# Patient Record
Sex: Male | Born: 1970 | Race: White | Hispanic: No | Marital: Single | Smoking: Current every day smoker
Health system: Southern US, Community
[De-identification: ages and names within clinical notes are randomized; demographics above are authoritative.]

## PROBLEM LIST (undated history)

## (undated) DIAGNOSIS — K219 Gastro-esophageal reflux disease without esophagitis: Secondary | ICD-10-CM

## (undated) DIAGNOSIS — K4090 Unilateral inguinal hernia, without obstruction or gangrene, not specified as recurrent: Secondary | ICD-10-CM

## (undated) DIAGNOSIS — F17200 Nicotine dependence, unspecified, uncomplicated: Secondary | ICD-10-CM

## (undated) DIAGNOSIS — R160 Hepatomegaly, not elsewhere classified: Secondary | ICD-10-CM

## (undated) DIAGNOSIS — J189 Pneumonia, unspecified organism: Secondary | ICD-10-CM

## (undated) DIAGNOSIS — S02651A Fracture of angle of right mandible, initial encounter for closed fracture: Secondary | ICD-10-CM

## (undated) DIAGNOSIS — IMO0001 Reserved for inherently not codable concepts without codable children: Secondary | ICD-10-CM

## (undated) DIAGNOSIS — I10 Essential (primary) hypertension: Secondary | ICD-10-CM

## (undated) DIAGNOSIS — F1010 Alcohol abuse, uncomplicated: Secondary | ICD-10-CM

## (undated) DIAGNOSIS — IMO0002 Reserved for concepts with insufficient information to code with codable children: Secondary | ICD-10-CM

## (undated) DIAGNOSIS — F1410 Cocaine abuse, uncomplicated: Secondary | ICD-10-CM

## (undated) HISTORY — DX: Reserved for inherently not codable concepts without codable children: IMO0001

## (undated) HISTORY — DX: Hepatomegaly, not elsewhere classified: R16.0

## (undated) HISTORY — DX: Fracture of angle of right mandible, initial encounter for closed fracture: S02.651A

## (undated) HISTORY — DX: Unilateral inguinal hernia, without obstruction or gangrene, not specified as recurrent: K40.90

## (undated) HISTORY — DX: Alcohol abuse, uncomplicated: F10.10

## (undated) HISTORY — DX: Reserved for concepts with insufficient information to code with codable children: IMO0002

## (undated) HISTORY — DX: Nicotine dependence, unspecified, uncomplicated: F17.200

---

## 2001-08-31 ENCOUNTER — Emergency Department (HOSPITAL_COMMUNITY): Admission: EM | Admit: 2001-08-31 | Discharge: 2001-09-01 | Payer: Self-pay | Admitting: Emergency Medicine

## 2001-08-31 ENCOUNTER — Encounter: Payer: Self-pay | Admitting: Emergency Medicine

## 2001-09-01 ENCOUNTER — Encounter: Payer: Self-pay | Admitting: Emergency Medicine

## 2003-01-24 ENCOUNTER — Emergency Department (HOSPITAL_COMMUNITY): Admission: EM | Admit: 2003-01-24 | Discharge: 2003-01-24 | Payer: Self-pay | Admitting: Emergency Medicine

## 2006-06-14 ENCOUNTER — Emergency Department (HOSPITAL_COMMUNITY): Admission: EM | Admit: 2006-06-14 | Discharge: 2006-06-14 | Payer: Self-pay | Admitting: Emergency Medicine

## 2007-05-14 ENCOUNTER — Emergency Department (HOSPITAL_COMMUNITY): Admission: EM | Admit: 2007-05-14 | Discharge: 2007-05-14 | Payer: Self-pay | Admitting: Emergency Medicine

## 2007-05-21 ENCOUNTER — Ambulatory Visit (HOSPITAL_COMMUNITY): Admission: RE | Admit: 2007-05-21 | Discharge: 2007-05-21 | Payer: Self-pay | Admitting: Orthopedic Surgery

## 2008-12-12 ENCOUNTER — Emergency Department (HOSPITAL_COMMUNITY): Admission: EM | Admit: 2008-12-12 | Discharge: 2008-12-12 | Payer: Self-pay | Admitting: Emergency Medicine

## 2009-11-07 ENCOUNTER — Emergency Department (HOSPITAL_COMMUNITY): Admission: EM | Admit: 2009-11-07 | Discharge: 2009-11-07 | Payer: Self-pay | Admitting: Emergency Medicine
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
## 2010-10-31 LAB — CBC
HCT: 40.3
Hemoglobin: 13.9
MCHC: 34.6
MCV: 99
Platelets: 253
RBC: 4.07 — ABNORMAL LOW
RDW: 13.7
WBC: 9.1

## 2011-07-30 ENCOUNTER — Encounter (HOSPITAL_COMMUNITY): Payer: Self-pay | Admitting: *Deleted

## 2011-07-30 ENCOUNTER — Emergency Department (HOSPITAL_COMMUNITY): Payer: Self-pay

## 2011-07-30 ENCOUNTER — Emergency Department (HOSPITAL_COMMUNITY)
Admission: EM | Admit: 2011-07-30 | Discharge: 2011-07-31 | Disposition: A | Discharge status: Home / Self Care | Payer: Self-pay | Attending: Emergency Medicine | Admitting: Emergency Medicine

## 2011-07-30 DIAGNOSIS — R109 Unspecified abdominal pain: Secondary | ICD-10-CM

## 2011-07-30 DIAGNOSIS — F1410 Cocaine abuse, uncomplicated: Secondary | ICD-10-CM | POA: Insufficient documentation

## 2011-07-30 DIAGNOSIS — R1013 Epigastric pain: Secondary | ICD-10-CM | POA: Insufficient documentation

## 2011-07-30 LAB — COMPREHENSIVE METABOLIC PANEL
ALT: 10 U/L (ref 0–53)
AST: 22 U/L (ref 0–37)
Albumin: 4 g/dL (ref 3.5–5.2)
Alkaline Phosphatase: 158 U/L — ABNORMAL HIGH (ref 39–117)
BUN: 12 mg/dL (ref 6–23)
CO2: 25 mEq/L (ref 19–32)
Calcium: 9.9 mg/dL (ref 8.4–10.5)
Chloride: 88 mEq/L — ABNORMAL LOW (ref 96–112)
Creatinine, Ser: 0.72 mg/dL (ref 0.50–1.35)
GFR calc Af Amer: >90 mL/min (ref >90)
GFR calc non Af Amer: >90 mL/min (ref >90)
Glucose, Bld: 170 mg/dL — ABNORMAL HIGH (ref 70–99)
Potassium: 3.8 mEq/L (ref 3.5–5.1)
Sodium: 128 mEq/L — ABNORMAL LOW (ref 135–145)
Total Bilirubin: 0.5 mg/dL (ref 0.3–1.2)
Total Protein: 8.7 g/dL — ABNORMAL HIGH (ref 6.0–8.3)

## 2011-07-30 LAB — CBC
HCT: 41.1 % (ref 39.0–52.0)
Hemoglobin: 14.6 g/dL (ref 13.0–17.0)
MCH: 35 pg — ABNORMAL HIGH (ref 26.0–34.0)
MCHC: 35.5 g/dL (ref 30.0–36.0)
MCV: 98.6 fL (ref 78.0–100.0)
Platelets: 401 10*3/uL — ABNORMAL HIGH (ref 150–400)
RBC: 4.17 MIL/uL — ABNORMAL LOW (ref 4.22–5.81)
RDW: 12.7 % (ref 11.5–15.5)
WBC: 13.4 10*3/uL — ABNORMAL HIGH (ref 4.0–10.5)

## 2011-07-30 LAB — RAPID URINE DRUG SCREEN, HOSP PERFORMED
Amphetamines: NOT DETECTED
Barbiturates: NOT DETECTED
Benzodiazepines: NOT DETECTED
Cocaine: POSITIVE — AB
Opiates: NOT DETECTED
Tetrahydrocannabinol: NOT DETECTED

## 2011-07-30 LAB — DIFFERENTIAL
Basophils Absolute: 0 10*3/uL (ref 0.0–0.1)
Basophils Relative: 0 % (ref 0–1)
Eosinophils Absolute: 0.1 10*3/uL (ref 0.0–0.7)
Eosinophils Relative: 0 % (ref 0–5)
Lymphocytes Relative: 13 % (ref 12–46)
Lymphs Abs: 1.7 10*3/uL (ref 0.7–4.0)
Monocytes Absolute: 1.7 10*3/uL — ABNORMAL HIGH (ref 0.1–1.0)
Monocytes Relative: 13 % — ABNORMAL HIGH (ref 3–12)
Neutro Abs: 10 10*3/uL — ABNORMAL HIGH (ref 1.7–7.7)
Neutrophils Relative %: 74 % (ref 43–77)

## 2011-07-30 LAB — LIPASE, BLOOD: Lipase: 26 U/L (ref 11–59)

## 2011-07-30 MED ORDER — ONDANSETRON HCL 4 MG/2ML IJ SOLN
4.0000 mg | Freq: Once | INTRAMUSCULAR | Status: AC
  Administered 2011-07-30: 4 mg via INTRAVENOUS
  Filled 2011-07-30: qty 2

## 2011-07-30 MED ORDER — SODIUM CHLORIDE 0.9 % IV SOLN
Freq: Once | INTRAVENOUS | Status: AC
  Administered 2011-07-30: 23:00:00 via INTRAVENOUS

## 2011-07-30 MED ORDER — HYDROCODONE-ACETAMINOPHEN 5-325 MG PO TABS: 1.0000 | ORAL_TABLET | ORAL | Status: DC | PRN

## 2011-07-30 MED ORDER — OXYCODONE-ACETAMINOPHEN 5-325 MG PO TABS
2.0000 | ORAL_TABLET | Freq: Once | ORAL | Status: AC
  Administered 2011-07-30: 2 via ORAL
  Filled 2011-07-30: qty 2

## 2011-07-30 MED ORDER — ONDANSETRON HCL 4 MG PO TABS: 4.0000 mg | ORAL_TABLET | Freq: Four times a day (QID) | ORAL | Status: DC

## 2011-07-30 MED ORDER — IOHEXOL 300 MG/ML  SOLN
100.0000 mL | Freq: Once | INTRAMUSCULAR | Status: AC | PRN
  Administered 2011-07-30: 100 mL via INTRAVENOUS

## 2011-07-30 NOTE — ED Provider Notes (Cosign Needed)
History     CSN: [id number]  Arrival date & time 07/30/11  1353   First MD Initiated Contact with Patient 07/30/11 1711      Chief Complaint  Patient presents with  . Abdominal Pain    (Consider location/radiation/quality/duration/timing/severity/associated sxs/prior treatment) HPI Comments: William Nguyen is a 41 y.o. male with a thorough abdominal pain for one year, worsening over the last 2 months. He feels a knot in his upper chest. He denies heavy alcohol use. He smokes cigarettes. He, states he's never had pancreatitis. The discomfort worsens when eating and drinking. He has not done anything to make the pain get better. He has no associated fever, chills, cough, shortness of breath, or chest pain.  Patient is a 41 y.o. male presenting with abdominal pain. The history is provided by the patient.  Abdominal Pain The primary symptoms of the illness include abdominal pain.    History reviewed. No pertinent past medical history.  History reviewed. No pertinent past surgical history.  No family history on file.  History  Substance Use Topics  . Smoking status: Never Smoker   . Smokeless tobacco: Not on file  . Alcohol Use: No      Review of Systems  Gastrointestinal: Positive for abdominal pain.  All other systems reviewed and are negative.    Allergies  Review of patient's allergies indicates no known allergies.  Home Medications   Current Outpatient Rx  Name Route Sig Dispense Refill  . OXYCODONE-ACETAMINOPHEN 5-325 MG PO TABS Oral Take 1 tablet by mouth every 4 (four) hours as needed for pain. 30 tablet 0    BP 126/87  Pulse 96  Temp 98.6 F (37 C) (Oral)  Resp 24  SpO2 95%  Physical Exam  Nursing note and vitals reviewed. Constitutional: He is oriented to person, place, and time. He appears well-developed and well-nourished.  HENT:  Head: Normocephalic and atraumatic.  Right Ear: External ear normal.  Left Ear: External ear normal.  Eyes:  Conjunctivae and EOM are normal. Pupils are equal, round, and reactive to light.  Neck: Normal range of motion and phonation normal. Neck supple.  Cardiovascular: Normal rate, regular rhythm, normal heart sounds and intact distal pulses.   Pulmonary/Chest: Effort normal and breath sounds normal. He exhibits no bony tenderness.  Abdominal: Soft. Normal appearance. There is tenderness (Mild epigastric tenderness).  Musculoskeletal: Normal range of motion.  Neurological: He is alert and oriented to person, place, and time. He has normal strength. No cranial nerve deficit or sensory deficit. He exhibits normal muscle tone. Coordination normal.  Skin: Skin is warm, dry and intact.  Psychiatric: He has a normal mood and affect. His behavior is normal. Judgment and thought content normal.    ED Course  Procedures (including critical care time)    Labs Reviewed  CBC - Abnormal; Notable for the following:    WBC 13.4 (*)     RBC 4.17 (*)     MCH 35.0 (*)     Platelets 401 (*)     All other components within normal limits  DIFFERENTIAL - Abnormal; Notable for the following:    Neutro Abs 10.0 (*)     Monocytes Relative 13 (*)     Monocytes Absolute 1.7 (*)     All other components within normal limits  COMPREHENSIVE METABOLIC PANEL - Abnormal; Notable for the following:    Sodium 128 (*)     Chloride 88 (*)     Glucose, Bld 170 (*)  Total Protein 8.7 (*)     Alkaline Phosphatase 158 (*)     All other components within normal limits  URINE RAPID DRUG SCREEN (HOSP PERFORMED) - Abnormal; Notable for the following:    Cocaine POSITIVE (*)     All other components within normal limits  LIPASE, BLOOD   US Abdomen Complete  07/30/2011  *RADIOLOGY REPORT*  Clinical Data:  Abdominal and epigastric pain for 2 months.  COMPLETE ABDOMINAL ULTRASOUND  Comparison:  None.  Findings:  Gallbladder:  No gallstones, gallbladder wall thickening, or pericholecystic fluid.  Common bile duct:  Normal  caliber with measured diameter of 3 mm.  Liver:  There is a hypoechoic circumscribed mass in the inferior right lobe of the liver anteriorly which measures 5.1 x 4.6 cm diameter.  There is a hypoechoic rim.  Flow is demonstrated within the mass on color flow Doppler imaging.  The appearance is worrisome for primary or secondary neoplasm.  Additional characterization using CT or MRI is recommended.  No additional focal liver lesions are identified.  IVC:  Appears normal.  Pancreas:  No focal abnormality seen.  Spleen:  Spleen length measures 4.7 cm.  Normal homogeneous parenchymal echotexture.  Right Kidney:  The right kidney measures 10.3 cm length.  No hydronephrosis.  There is a central prominent soft tissue structure measuring about 3 cm diameter which may represent a prominent column of Bertin or less likely a mass lesion.  This could be further evaluated at the same time as the liver lesion.  Left Kidney:  Left kidney measures 10 cm length.  No hydronephrosis.  Abdominal aorta:  No aneurysm identified.  IMPRESSION: Approximately 5 cm diameter hypoechoic mass in the right lobe of the liver with peripheral hypoechoic rim and internal flow.  The appearance is worrisome for primary or secondary neoplasm and additional characterization using CT or MRI is recommended. Probable prominent column of Bertin in the right kidney can also be additionally evaluated at that time.  Original Report Authenticated By: Marlon Pel, M.D.   Ct Abdomen Pelvis W Contrast  07/30/2011  *RADIOLOGY REPORT*  Clinical Data: Epigastric pain for 1 year, worse in the last 2 minutes.  Nausea and vomiting today.  CT ABDOMEN AND PELVIS WITH CONTRAST  Technique:  Multidetector CT imaging of the abdomen and pelvis was performed following the standard protocol during bolus administration of intravenous contrast.  Contrast: OMNIPAQUE IOHEXOL 300 MG/ML  SOLN  Comparison: None.  Findings: The lung bases are clear.  There is a rounded  focus of vague abnormal enhancement in the inferior aspect of the right lobe of the liver measuring about 4 by 4.8 x 5.5 cm diameter.  This is associated with mild focal contour abnormality. Transit flow phenomenon may have this appearance but hepatic mass lesion should be excluded.  Consider MRI for further evaluation.  The gallbladder, spleen, pancreas, adrenal glands, kidneys, abdominal aorta, and retroperitoneal lymph nodes are unremarkable.  The stomach is incompletely distended but there appears to be diffuse gastric wall thickening suggesting gastritis or possibly infiltrative neoplastic process. Gastric filling defects are likely due to ingested material.  The small bowel and colon are not abnormally distended.  No free air or free fluid in the abdomen.  Pelvis: The prostate gland is not enlarged.  No bladder wall thickening.  No free or loculated pelvic fluid collections.  The appendix is normal.  Scattered diverticula in the sigmoid colon without diverticulitis.  No significant pelvic lymphadenopathy. Moderately large left inguinal hernia  containing fat.  No bowel content.  Normal alignment of the lumbar spine with mild degenerative change.  IMPRESSION: Nodular heterogeneous focus in the inferior right lobe of the liver.  Hepatic mass lesion should be excluded.  Consider MRI for further evaluation. Diffuse thickening and edema in the gastric wall most likely to represent gastritis or inflammatory process. Neoplastic infiltrative process not entirely excluded.  Endoscopy may be useful in further evaluation.  Moderate sized left inguinal hernia containing fat.  Original Report Authenticated By: Marlon Pel, M.D.     1. Abdominal  pain, other specified site   2. Cocaine abuse       MDM   Patient symptoms are nonspecific and ongoing, chronic. Ultrasound finding of liver lesion, evaluate further with CT scan does not specify the abnormality. He may have gastritis causing his primary symptoms  of the epigastric discomfort. He appears stabilized for discharge.Doubt metabolic instability, serious bacterial infection or impending vascular collapse; the patient is stable for discharge.     Plan: Home Medications- Percocet; Home Treatments- rest, avoid Cocaine; Recommended follow up- GI in 5-7 days.      Flint Melter, MD 07/31/11 [phone]

## 2011-07-30 NOTE — ED Notes (Signed)
Correction to urine source: Patient was not catheterized, he ambulated to the bathroom and urinated in the cup

## 2011-07-30 NOTE — ED Notes (Signed)
Pt c/o epigastric pain x1 yr, worse in last 2 months, beginning yesterday "feels like a golf ball is getting ready to come through my chest." Reports pain worse with eating. +nausea, emesis x3 this am.

## 2011-07-31 MED ORDER — OXYCODONE-ACETAMINOPHEN 5-325 MG PO TABS: 1.0000 | ORAL_TABLET | ORAL | Status: AC | PRN

## 2011-07-31 NOTE — Discharge Instructions (Signed)
Abdominal Pain Abdominal pain can be caused by many things. Your caregiver decides the seriousness of your pain by an examination and possibly blood tests and X-rays. Many cases can be observed and treated at home. Most abdominal pain is not caused by a disease and will probably improve without treatment. However, in many cases, more time must pass before a clear cause of the pain can be found. Before that point, it may not be known if you need more testing, or if hospitalization or surgery is needed. HOME CARE INSTRUCTIONS   Do not take laxatives unless directed by your caregiver.   Take pain medicine only as directed by your caregiver.   Only take over-the-counter or prescription medicines for pain, discomfort, or fever as directed by your caregiver.   Try a clear liquid diet (broth, tea, or water) for as long as directed by your caregiver. Slowly move to a bland diet as tolerated.  SEEK IMMEDIATE MEDICAL CARE IF:   The pain does not go away.   You have a fever.   You keep throwing up (vomiting).   The pain is felt only in portions of the abdomen. Pain in the right side could possibly be appendicitis. In an adult, pain in the left lower portion of the abdomen could be colitis or diverticulitis.   You pass bloody or black tarry stools.  MAKE SURE YOU:   Understand these instructions.   Will watch your condition.   Will get help right away if you are not doing well or get worse.  Document Released: 11/01/2004 Document Revised: 01/11/2011 Document Reviewed: 09/10/2007 [facility] Patient Information 2012 [place], [place].Cocaine Abuse PROBLEMS FROM USING COCAINE:   Highly addictive.   Illegal.   Risk of sudden death.   Heart disease.   Irregular heart beat.   High blood pressure.   Damage to nose and lungs.   Severe agitation.   Hallucinations.   Violent behavior.   Paranoia.   Sexual dysfunction.  Most cocaine users deny that they have a problem with addiction. The  biggest problem is admitting that you are dependent on cocaine. Those trying to quit using it may experience depression and withdrawal symptoms. Other withdrawal symptoms include fatigue, suicidal thoughts, sleepiness, restlessness, anxiety, and increased craving for cocaine. There are medications available to help prevent depression associated with stopping cocaine. Most users will find a support group or treatment program helpful in coming off and staying off cocaine. The best chance to cure cocaine addiction is to go into group therapy and to be in a drug-free environment. It is very important to develop healthy relationships and avoid socializing with people who use or deal drugs. Eat well, and give your body the proper rest and healthy exercise it needs. You may need medication to help treat withdrawal symptoms. Call your caregiver or a drug treatment center for more help.  You may also want to call the [facility] on Drug Abuse at 800-662-HELP in the [place] SEEK IMMEDIATE MEDICAL CARE IF:  You develop severe chest pain.   You develop shortness of breath.   You develop extreme agitation.  Document Released: 03/01/2004 Document Revised: 01/11/2011 Document Reviewed: 11/24/2008 [facility] Patient Information 2012 [place], [place].

## 2011-08-06 ENCOUNTER — Other Ambulatory Visit: Payer: Self-pay | Admitting: Gastroenterology

## 2011-08-06 DIAGNOSIS — R112 Nausea with vomiting, unspecified: Secondary | ICD-10-CM

## 2011-08-06 DIAGNOSIS — R1013 Epigastric pain: Secondary | ICD-10-CM

## 2011-08-06 DIAGNOSIS — R933 Abnormal findings on diagnostic imaging of other parts of digestive tract: Secondary | ICD-10-CM

## 2011-08-13 ENCOUNTER — Other Ambulatory Visit: Payer: Self-pay

## 2011-08-15 ENCOUNTER — Ambulatory Visit
Admission: RE | Admit: 2011-08-15 | Discharge: 2011-08-15 | Disposition: A | Discharge status: Home / Self Care | Payer: No Typology Code available for payment source | Source: Ambulatory Visit | Attending: Gastroenterology | Admitting: Gastroenterology

## 2011-08-15 DIAGNOSIS — R1013 Epigastric pain: Secondary | ICD-10-CM

## 2011-08-15 DIAGNOSIS — R112 Nausea with vomiting, unspecified: Secondary | ICD-10-CM

## 2011-08-15 DIAGNOSIS — R933 Abnormal findings on diagnostic imaging of other parts of digestive tract: Secondary | ICD-10-CM

## 2011-08-15 MED ORDER — GADOBENATE DIMEGLUMINE 529 MG/ML IV SOLN
14.0000 mL | Freq: Once | INTRAVENOUS | Status: AC | PRN
  Administered 2011-08-15: 14 mL via INTRAVENOUS

## 2013-04-01 ENCOUNTER — Emergency Department (HOSPITAL_COMMUNITY)
Admission: EM | Admit: 2013-04-01 | Discharge: 2013-04-01 | Disposition: A | Discharge status: Home / Self Care | Payer: Worker's Compensation | Attending: Emergency Medicine | Admitting: Emergency Medicine

## 2013-04-01 ENCOUNTER — Emergency Department (HOSPITAL_COMMUNITY): Payer: Worker's Compensation

## 2013-04-01 ENCOUNTER — Encounter (HOSPITAL_COMMUNITY): Payer: Self-pay | Admitting: Emergency Medicine

## 2013-04-01 DIAGNOSIS — S93409A Sprain of unspecified ligament of unspecified ankle, initial encounter: Secondary | ICD-10-CM | POA: Insufficient documentation

## 2013-04-01 DIAGNOSIS — Y9339 Activity, other involving climbing, rappelling and jumping off: Secondary | ICD-10-CM | POA: Insufficient documentation

## 2013-04-01 DIAGNOSIS — Y9289 Other specified places as the place of occurrence of the external cause: Secondary | ICD-10-CM | POA: Insufficient documentation

## 2013-04-01 DIAGNOSIS — Y990 Civilian activity done for income or pay: Secondary | ICD-10-CM | POA: Insufficient documentation

## 2013-04-01 DIAGNOSIS — X500XXA Overexertion from strenuous movement or load, initial encounter: Secondary | ICD-10-CM | POA: Insufficient documentation

## 2013-04-01 MED ORDER — HYDROCODONE-ACETAMINOPHEN 5-325 MG PO TABS: 1.0000 | ORAL_TABLET | ORAL | Status: DC | PRN

## 2013-04-01 NOTE — ED Provider Notes (Signed)
CSN: [id number]     Arrival date & time 04/01/13  1825 History  This chart was scribed for Gertha Calkin, non-physician practitioner, PA-C, working with Neta Ehlers, MD, by Sydell Axon, ED Scribe. This patient was seen in room WTR8/WTR8 and the patient's care was started at 8:50 PM.   Chief Complaint  Patient presents with  . Ankle Pain   The history is provided by the patient. No language interpreter was used.   HPI Comments: William Nguyen is a 43 y.o. male who presents to the Emergency Department complaining of L ankle ankle with onset today. 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 Patient characterizes his pain as a constant, non changing ache/soreness and states he cannot bear any weight on the ankle. Additionally, he states that movement and touch exacerbate the pain. He denies any numbness or loss of sensation. Patient denies applying ice or taking medication for the pain. Patient reports having injured this ankle in the 2-3 times in the past.   History reviewed. No pertinent past medical history. History reviewed. No pertinent past surgical history. History reviewed. No pertinent family history. History  Substance Use Topics  . Smoking status: Never Smoker   . Smokeless tobacco: Not on file  . Alcohol Use: No    Review of Systems  Constitutional: Negative for chills.  Respiratory: Negative.   Cardiovascular: Negative.   Gastrointestinal: Negative for nausea, vomiting and diarrhea.  Musculoskeletal: Positive for arthralgias (L ankle pain) and joint swelling. Negative for back pain and neck pain.  Skin: Negative.   Neurological: Negative for weakness and numbness.  All other systems reviewed and are negative.   Allergies  Review of patient's allergies indicates no known allergies.  Home Medications  No current outpatient prescriptions on file. Triage Vitals:  BP 129/80  Pulse 101  Temp(Src) 97.8 F (36.6 C) (Oral)  Resp 20  Ht 5\' 10"  (1.778 m)  Wt 160 lb (72.576 kg)  BMI 22.96 kg/m2  SpO2 95%  Physical Exam  Nursing note and vitals reviewed. Constitutional: He is oriented to person, place, and time. He appears well-developed and well-nourished. No distress.  HENT:  Head: Normocephalic and atraumatic.  Eyes:  Normal appearance  Neck: Normal range of motion.  Pulmonary/Chest: Effort normal.  Musculoskeletal: Normal range of motion.  No deformity L ankle.  Mild edema anterior ankle compared to the right.  Tenderness most prominent dorsal aspect tarsal bones.  Pain w/ passive ROM of ankle, particularly plantar and dorsiflexion.  2+ DP pulse, brisk cap refill and distal sensation intact.    Neurological: He is alert and oriented to person, place, and time.  Psychiatric: He has a normal mood and affect. His behavior is normal.   ED Course  Procedures (including critical care time) DIAGNOSTIC STUDIES: Oxygen Saturation is 95% on room air, adequate by my interpretation.    COORDINATION OF CARE: 8:57 PM-Reviewed negative imaging results with patient and my suspicion of a soft-tissue injury. Recommended RICE, and taking an anti-inflammatory and pain medication. Will order an ankle brace for patient to immobilize the ankle. Treatment plan discussed with patient and patient agrees.  Labs Review Labs Reviewed - No data to display Imaging Review Dg Ankle Complete Left  04/01/2013   CLINICAL DATA:  43 year old male with left ankle injury and pain.  EXAM: LEFT ANKLE COMPLETE - 3+ VIEW  COMPARISON:  12/12/2008  FINDINGS: There is no evidence of acute fracture, subluxation, or dislocation.  Remote fracture of the distal fibula is noted.  The talus is unremarkable.  Mild degenerative changes of the tibiotalar joint noted.  IMPRESSION: No evidence of acute bony abnormality.   Electronically Signed   By: Hassan Rowan M.D.   On: 04/01/2013 20:21    EKG  Interpretation   None       MDM   Final diagnoses:  Ankle sprain    Healthy 43yo M presents w/ L ankle injury.  Xray neg for fx/dislocation and no NV deficits on exam.   Will treat symptomatically for sprain w/ NSAID and RICE.  Prescribed 8 vicodin as well.  Referred to ortho for persistent/worsening sx.  9:27 PM   I personally performed the services described in this documentation, which was scribed in my presence. The recorded information has been reviewed and is accurate.    [place], PA-C 04/01/13 2130

## 2013-04-01 NOTE — ED Notes (Signed)
Pt was at work today and twisted left ankle.  Previous break to same site.

## 2013-04-01 NOTE — Discharge Instructions (Signed)
Take vicodin as prescribed for severe pain.   Do not drive within four hours of taking this medication (may cause drowsiness or confusion).  Take up to 800mg  of ibuprofen three times a day for the next 3-4 days (take with food).  Ice 3 times a day for 15-20 minutes.  Elevate when possible to decrease swelling and pain.  Activity as tolerated.  Follow up with the orthopedist if your pain has not started to improve in 5-7 days, or you develop weakness of the injured joint.    You may return to the ER if your pain worsens or you have any other concerns.

## 2013-04-01 NOTE — ED Provider Notes (Signed)
Medical screening examination/treatment/procedure(s) were performed by non-physician practitioner and as supervising physician I was immediately available for consultation/collaboration.  Neta Ehlers, MD 04/01/13 [phone]

## 2016-03-07 ENCOUNTER — Ambulatory Visit: Payer: Self-pay | Attending: Internal Medicine | Admitting: Physician Assistant

## 2016-03-07 ENCOUNTER — Telehealth: Payer: Self-pay | Admitting: *Deleted

## 2016-03-07 VITALS — BP 119/84 | HR 91 | Temp 98.9°F | Resp 16 | Wt 150.0 lb

## 2016-03-07 DIAGNOSIS — F17210 Nicotine dependence, cigarettes, uncomplicated: Secondary | ICD-10-CM | POA: Insufficient documentation

## 2016-03-07 DIAGNOSIS — K4090 Unilateral inguinal hernia, without obstruction or gangrene, not specified as recurrent: Secondary | ICD-10-CM | POA: Insufficient documentation

## 2016-03-07 DIAGNOSIS — J189 Pneumonia, unspecified organism: Secondary | ICD-10-CM | POA: Insufficient documentation

## 2016-03-07 DIAGNOSIS — F17200 Nicotine dependence, unspecified, uncomplicated: Secondary | ICD-10-CM

## 2016-03-07 DIAGNOSIS — R938 Abnormal findings on diagnostic imaging of other specified body structures: Secondary | ICD-10-CM

## 2016-03-07 DIAGNOSIS — Z7289 Other problems related to lifestyle: Secondary | ICD-10-CM | POA: Insufficient documentation

## 2016-03-07 DIAGNOSIS — R9389 Abnormal findings on diagnostic imaging of other specified body structures: Secondary | ICD-10-CM

## 2016-03-07 DIAGNOSIS — R160 Hepatomegaly, not elsewhere classified: Secondary | ICD-10-CM | POA: Insufficient documentation

## 2016-03-07 DIAGNOSIS — Z7689 Persons encountering health services in other specified circumstances: Secondary | ICD-10-CM | POA: Insufficient documentation

## 2016-03-07 NOTE — Telephone Encounter (Signed)
Oncology Nurse Navigator Documentation  Oncology Nurse Navigator Flowsheets 03/07/2016  Navigator Location [facility]  Referral date to RadOnc/MedOnc 03/07/2016  Navigator Encounter Type Telephone  Telephone Outgoing Call  Abnormal Finding Date 02/19/2016  Office note from referring provider reports CT scan at hospital in [place], [place] on 02/19/16. Called and left VM requesting call back to confirm what hospital the scan was done and for him to come to [facility] to sign ROI so we can request the CD and records. Noted on CT scan 07/30/11 a liver mass was noted in right lobe, with no follow up documented.

## 2016-03-07 NOTE — Telephone Encounter (Addendum)
His mother returned call and reports they have his records from the hospital visit in [place] at [facility] as well as scans on CD. She will bring them to the office tomorrow and ask receptionist to get navigator to meet her in lobby. She is concerned he has no insurance. Made her aware he will receive care regardless of his ability to pay. [facility] is working on him getting orange card next week. Reassured her he will meet with financial advocate here once plan of care is determined for assistance and instruction on how to apply for Medicaid if he qualifies.

## 2016-03-07 NOTE — Progress Notes (Signed)
William Nguyen  [id number]  [id number]  DOB - April 02, 1970  Chief Complaint  Patient presents with  . Hospitalization Follow-up       Subjective:   William Nguyen is a 46 y.o. male here today for establishment of care. He does not have a lot of past medical history. 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 He began experiencing increasing cough, congestion and fevers up to 103. He had a chest x-ray done there that showed masslike bilateral airspace opacities and a CT scan was recommended of his chest. The CT of the chest showed airspace opacities in the left upper lobe, right middle lobe, and right lower lobe. They actually thought this may be a pneumonia and he was treated with azithromycin for 7 days. He also received a call sooner.   He also underwent CT of the abdomen and pelvis. This showed a 5.6 x 4.8 x 5.9 cm liver lesion. Also suggested a thickened gallbladder but no evidence of cholecystitis on GB US. His kidneys were okay and incidentally had a left inguinal hernia found.  He was discharged and told that he needed to find a PCP ASAP to be referred to an oncologist.  He smokes one pack per day of cigarettes. Every other day he drinks a six pack of beer, heavier in the past. He states that his fever and chills have gone away. His cough is gotten a lot better. He completed the antibiotics. He still has a little cough syrup left.  Other labs showed a glucose of 178. Creatinine of 0.7. Potassium 3.4. ALP of 16. AST 25. WBC 10. Hemoglobin 12.7. Platelets 299.  ROS: GEN: denies fever or chills, denies change in weight Skin: denies lesions or rashes HEENT: denies headache, earache, epistaxis, sore throat, or neck pain LUNGS: denies SHOB, dyspnea, PND, orthopnea CV: denies CP or palpitations ABD: denies abd pain, N or V EXT: denies muscle spasms or swelling; no pain in lower ext, no  weakness NEURO: denies numbness or tingling, denies sz, stroke or TIA  ALLERGIES: No Known Allergies  PAST MEDICAL HISTORY: No past medical history on file.  PAST SURGICAL HISTORY: No past surgical history on file.  MEDICATIONS AT HOME: Prior to Admission medications   Medication Sig Start Date End Date Taking? Authorizing Provider  HYDROcodone-acetaminophen (NORCO/VICODIN) 5-325 MG per tablet Take 1 tablet by mouth every 4 (four) hours as needed for moderate pain. Patient not taking: Reported on 03/07/2016 04/01/13   Gertha Calkin, PA-C     Objective:   Vitals:   03/07/16 0923 03/07/16 0941  BP: (!) 148/89 119/84  Pulse: 91   Resp: 16   Temp: 98.9 F (37.2 C)   TempSrc: Oral   SpO2: 98%   Weight: 150 lb (68 kg)     Exam-benign General appearance : Awake, alert, not in any distress. Speech Clear. Not toxic looking HEENT: Atraumatic and Normocephalic, pupils equally reactive to light and accomodation Neck: supple, no JVD. No cervical lymphadenopathy.  Chest:Good air entry bilaterally, no added sounds  CVS: S1 S2 regular, no murmurs.  Abdomen: Bowel sounds present, Non tender and not distended with no gaurding, rigidity or rebound. Extremities: B/L Lower Ext shows no edema, both legs are warm to touch Neurology: Awake alert, and oriented X 3, CN II-XII intact, Non focal Skin:No Rash Wounds:N/A   Assessment & Plan  1. Liver mass  -Oncology referral  -ETOH cessation  2. PNA  -course of antibiotic completed  3. Smoker  -cessation discussed  Financial counselor appt pending Return in about 1 week (around 03/14/2016).  The patient was given clear instructions to go to ER or return to medical center if symptoms don't improve, worsen or new problems develop. The patient verbalized understanding. The patient was told to call to get lab results if they haven't heard anything in the next week.   This note has been created with Designer, industrial/product. Any transcriptional errors are unintentional.    Zettie Pho, PA-C [facility] [place], [place]   03/07/2016, 11:29 AM

## 2016-03-08 ENCOUNTER — Ambulatory Visit
Admission: RE | Admit: 2016-03-08 | Discharge: 2016-03-08 | Disposition: A | Discharge status: Home / Self Care | Payer: Worker's Compensation | Source: Ambulatory Visit | Attending: Hematology | Admitting: Hematology

## 2016-03-08 ENCOUNTER — Ambulatory Visit
Admission: RE | Admit: 2016-03-08 | Discharge: 2016-03-08 | Disposition: A | Discharge status: Home / Self Care | Payer: Self-pay | Source: Ambulatory Visit | Attending: Hematology | Admitting: Hematology

## 2016-03-08 ENCOUNTER — Other Ambulatory Visit: Payer: Self-pay | Admitting: Hematology

## 2016-03-08 ENCOUNTER — Encounter: Payer: Self-pay | Admitting: *Deleted

## 2016-03-08 DIAGNOSIS — J189 Pneumonia, unspecified organism: Secondary | ICD-10-CM

## 2016-03-08 DIAGNOSIS — K769 Liver disease, unspecified: Secondary | ICD-10-CM

## 2016-03-08 HISTORY — DX: Pneumonia, unspecified organism: J18.9

## 2016-03-08 NOTE — Progress Notes (Signed)
Oncology Nurse Navigator Documentation  Oncology Nurse Navigator Flowsheets 03/07/2016 03/08/2016  Navigator Location [facility]  Referral date to RadOnc/MedOnc 03/07/2016 -  Navigator Encounter Type Telephone Lobby  Telephone Outgoing Call -  Abnormal Finding Date 02/19/2016 -  Patient and his mother, brought in his scans on CD and records from his hospital encounter in [place] [facility]). He will see Dr. Burr Medico on 03/13/16 at 2:15/2:30. CD's taken to radiology to upload and records delivered to [facility] in [place] to be scanned by 03/13/16 appointment.

## 2016-03-08 NOTE — Progress Notes (Signed)
[place]  [phone] [phone]  Clinic New Consult Note   Patient Care Team: Pcp Not In System as PCP - General 03/13/2016  CHIEF COMPLAINTS/PURPOSE OF CONSULTATION:  Liver Mass  HISTORY OF PRESENTING ILLNESS (03/13/16):  William Nguyen 46 y.o. male is here because of a liver mass. He is accompanied by his mother to my clinic today.   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 He began experiencing increasing cough, congestion and fevers up to 103. He had a chest x-ray done there that showed masslike bilateral airspace opacities and a CT scan was recommended of his chest. The CT of the chest showed airspace opacities in the left upper lobe, right middle lobe, and right lower lobe. They actually thought this may be a pneumonia and he was treated with azithromycin for 7 days. He also underwent CT of the abdomen and pelvis. This showed a 5.6 x 4.8 x 5.9 cm liver lesion. Also suggested a thickened gallbladder but no evidence of cholecystitis on GB US. His kidneys were okay and incidentally had a left inguinal hernia found. He was discharged and told that he needed to find a PCP ASAP to be referred to an oncologist.  Pt presents with his mother. He still has a cough and some SOB with exertion. He has some cramping and pain that comes and goes on the right side of his abdomen. He ranks it a 5/10. It happens every day, but isn't constant. He doesn't take any pain medication. His appetite has been good, but he has been very tired. He has not been able to work due to this fatigue and pain. Pt has some nausea. His bowel movements are normal. He has had a lot of headaches since his fever in [place]. He lost 10 pounds in the last month. Denies chest pain, black stool, blood in the stool, leg swelling, mouth sores, difficulty swallowing, or any other concerns. He hasn't been to see a PCP in several  years. He has only been to the doctor for a stomach ulcer he had in 2014 or 2015.   He smokes 1/2 pack per day of cigarettes. He use to smoke 1 ppd for about 20 years. Every other day he drinks a six pack of beer, heavier in the past. He doesn't drink liquor or wine. On weekends he drinks more than normal. He has been drinking about 25 years. He is single and has 2 kids. No history of liver disease. No recreational drug use. NKA. He is not on any medications other than one pill for pain. 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 It is worse in his left ear.    MEDICAL HISTORY:  Past Medical History:  Diagnosis Date  . Ulcer ([place])    gastric ulcers    SURGICAL HISTORY: History reviewed. No pertinent surgical history.  SOCIAL HISTORY: Social History   Social History  . Marital status: Single    Spouse name: N/A  . Number of children: N/A  . Years of education: N/A   Occupational History  . Not on file.   Social History Main Topics  . Smoking status: Current Every Day Smoker    Packs/day: 1.00    Years: 25.00    Types: Cigarettes  . Smokeless tobacco: Never Used  . Alcohol use 3.6 oz/week    6 Cans of beer per week     Comment: every  other day; heavier in past, for the past 20 years   . Drug use: No  . Sexual activity: Not on file   Other Topics Concern  . Not on file   Social History Narrative  . No narrative on file    FAMILY HISTORY: History reviewed. No pertinent family history.  ALLERGIES:  has No Known Allergies.  MEDICATIONS:  Current Outpatient Prescriptions  Medication Sig Dispense Refill  . traMADol (ULTRAM) 50 MG tablet Take 1 tablet (50 mg total) by mouth every 6 (six) hours as needed. 30 tablet 0   No current facility-administered medications for this visit.     REVIEW OF SYSTEMS:   Constitutional: Denies fevers, chills or abnormal night sweats (+) fatigue, 10 lb weight loss Eyes: Denies blurriness of vision, double vision or watery  eyes Ears, nose, mouth, throat, and face: Denies mucositis or sore throat (+) hearing loss, worse in L ear Respiratory: Denies cough, dyspnea or wheezes (+) cough, SOB Cardiovascular: Denies palpitation, chest discomfort or lower extremity swelling Gastrointestinal:  Denies heartburn or change in bowel habits (+) right sided abdominal pain. (+) nausea Skin: Denies abnormal skin rashes Lymphatics: Denies new lymphadenopathy or easy bruising Neurological:Denies numbness, tingling or new weaknesses (+) headaches Behavioral/Psych: Mood is stable, no new changes  All other systems were reviewed with the patient and are negative.  PHYSICAL EXAMINATION: ECOG PERFORMANCE STATUS: 0 - Asymptomatic  Vitals:   03/13/16 1533  BP: (!) 136/91  Pulse: 86  Resp: 18  Temp: 99.1 F (37.3 C)   Filed Weights   03/13/16 1533  Weight: 150 lb 3.2 oz (68.1 kg)   GENERAL:alert, no distress and comfortable SKIN: skin color, texture, turgor are normal, no rashes or significant lesions EYES: normal, conjunctiva are pink and non-injected, sclera clear OROPHARYNX:no exudate, no erythema and lips, buccal mucosa, and tongue normal  NECK: supple, thyroid normal size, non-tender, without nodularity LYMPH:  no palpable lymphadenopathy in the cervical, axillary or inguinal LUNGS: clear to auscultation and percussion with normal breathing effort HEART: regular rate & rhythm and no murmurs and no lower extremity edema ABDOMEN:abdomen soft, normal bowel sounds (+) tenderness Musculoskeletal:no cyanosis of digits and no clubbing  PSYCH: alert & oriented x 3 with fluent speech NEURO: no focal motor/sensory deficits  LABORATORY DATA:  I have reviewed the data as listed CBC Latest Ref Rng & Units 07/30/2011 05/20/2007  WBC 4.0 - 10.5 K/uL 13.4(H) 9.1  Hemoglobin 13.0 - 17.0 g/dL 14.6 13.9  Hematocrit 39.0 - 52.0 % 41.1 40.3  Platelets 150 - 400 K/uL 401(H) 253    CMP Latest Ref Rng & Units 07/30/2011  Glucose 70 -  99 mg/dL 170(H)  BUN 6 - 23 mg/dL 12  Creatinine 0.50 - 1.35 mg/dL 0.72  Sodium 135 - 145 mEq/L 128(L)  Potassium 3.5 - 5.1 mEq/L 3.8  Chloride 96 - 112 mEq/L 88(L)  CO2 19 - 32 mEq/L 25  Calcium 8.4 - 10.5 mg/dL 9.9  Total Protein 6.0 - 8.3 g/dL 8.7(H)  Total Bilirubin 0.3 - 1.2 mg/dL 0.5  Alkaline Phos 39 - 117 U/L 158(H)  AST 0 - 37 U/L 22  ALT 0 - 53 U/L 10     RADIOGRAPHIC STUDIES: I have personally reviewed the radiological report, the images were not available for me to review when he presents to my clinic  CT CAP w/ Contrast 02/20/16 IMPRESSION: 1. 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  2.  Contracted gallbladder with thickened wall and pericholecystic fluid. Consider evaluation with ultrasound to exclude acute cholecystitis.  3. Large left colon containing inguinal hernia. No evidence of strangulation or incarceration.  4. Small volume simple appearing free fluid within the pelvis likely secondary to a combination of the above findings.   PA and lateral views of the chest 02/19/16 IMPRESSION: 1. Masslike bilateral airspace opacities. While this is favored to represent infection, malignancy cannot be excluded. Recommend CT of the chest with contrast.   MRI abdomen w and wo contrast 08/15/2011 IMPRESSION: 1.  Hyperenhancing lesion within the right hepatic lobe is again identified.  The signal and enhancement characteristics favor a liver adenoma or focal nodular hyperplasia. A more specific assessment  for focal nodular aplasia may be obtained with contrast enhanced MRI using Eovist contrast material.  ASSESSMENT & PLAN:  46 y.o. male with a history of smoking and drinking for 20+ years, presents with:  1. Liver Mass -this was incidentally found when he was admitted to outside hospital for pneumonia, unfortunately the images were not available for me to review when he presented to my clinic. The  CD was sent to radiology to download  -his previously CT and MRI of abdomen showed a 4.5cm mass in right lobe of liver, favor adenoma or FNH, which are benign, no suspicious for hepatocellular carcinoma  -I suspect his liver mass on his recent CT is the same lesion which showed on previous scan, likely benign, although the size has increased over time  -I will present him to tumor board to determine if we need further follow up scan or liver biopsy   2. Multilobar pneumonia -he has completed a course of antibiotics, and his symptoms are near resolved -I recommend him to have a repeated CT scan to ensure his pneumonia has resolved -Prescribe Tramadol for his pain.   2. Financial Stress -He does not currently have insurance. He is trying to get an orange card currently. -He is unsure how to apply for medicaid.  -He will meet with our social worker and our Hospital doctor.   Plan -will present him case in our GI tumor board next week  -Prescribe Tramadol.  -lab and follow up with a CT chest in 4 weeks    All questions were answered. The patient knows to call the clinic with any problems, questions or concerns.  I spent 50 minutes counseling the patient face to face. The total time spent in the appointment was 60 minutes and more than 50% was on counseling.   This document serves as a record of services personally performed by Truitt Merle, MD. It was created on her behalf by Martinique Casey, a trained medical scribe. The creation of this record is based on the scribe's personal observations and the provider's statements to them. This document has been checked and approved by the attending provider.  I have reviewed the above documentation for accuracy and completeness and I agree with the above.   Truitt Merle, MD 03/13/2016  Addendum His images were reviewed in GI conference, his live lesion is favor to be benign (adenoma or [place]), size increased since 2013, tumor board recommend a follow up MRI  with Eovist I will see him back in 4 weeks with CT chest and abd MRI The above was communicated with pt   Truitt Merle  03/16/2016

## 2016-03-09 ENCOUNTER — Encounter: Payer: Self-pay | Admitting: *Deleted

## 2016-03-09 NOTE — Progress Notes (Signed)
CD's from [place] have been loaded into [facility] system per radiology. Returned CD's to collaborative nurse to return to patient on 03/13/16 appointment with Dr. Burr Medico.

## 2016-03-12 ENCOUNTER — Ambulatory Visit: Payer: Self-pay | Attending: Internal Medicine

## 2016-03-13 ENCOUNTER — Ambulatory Visit (HOSPITAL_BASED_OUTPATIENT_CLINIC_OR_DEPARTMENT_OTHER): Payer: Self-pay | Admitting: Hematology

## 2016-03-13 ENCOUNTER — Encounter: Payer: Self-pay | Admitting: Hematology

## 2016-03-13 ENCOUNTER — Telehealth: Payer: Self-pay | Admitting: Hematology

## 2016-03-13 DIAGNOSIS — J189 Pneumonia, unspecified organism: Secondary | ICD-10-CM

## 2016-03-13 DIAGNOSIS — R160 Hepatomegaly, not elsewhere classified: Secondary | ICD-10-CM

## 2016-03-13 DIAGNOSIS — R634 Abnormal weight loss: Secondary | ICD-10-CM

## 2016-03-13 DIAGNOSIS — R51 Headache: Secondary | ICD-10-CM

## 2016-03-13 DIAGNOSIS — Z720 Tobacco use: Secondary | ICD-10-CM

## 2016-03-13 DIAGNOSIS — R05 Cough: Secondary | ICD-10-CM

## 2016-03-13 DIAGNOSIS — R109 Unspecified abdominal pain: Secondary | ICD-10-CM

## 2016-03-13 MED ORDER — TRAMADOL HCL 50 MG PO TABS: 50.0000 mg | ORAL_TABLET | Freq: Four times a day (QID) | ORAL | 0 refills | Status: DC | PRN

## 2016-03-13 NOTE — Telephone Encounter (Signed)
Appointments scheduled per 2/6 LOS. Patient given AVS report and calendars with future scheduled appointments. °

## 2016-03-17 ENCOUNTER — Encounter: Payer: Self-pay | Admitting: Hematology

## 2016-03-17 DIAGNOSIS — J189 Pneumonia, unspecified organism: Secondary | ICD-10-CM | POA: Insufficient documentation

## 2016-03-17 DIAGNOSIS — R160 Hepatomegaly, not elsewhere classified: Secondary | ICD-10-CM | POA: Insufficient documentation

## 2016-03-21 ENCOUNTER — Telehealth: Payer: Self-pay | Admitting: Hematology

## 2016-03-21 NOTE — Telephone Encounter (Signed)
sw pt mom to confirm appt date/time change per LOS. Gave pt mom new appt date/times per LOS

## 2016-03-22 ENCOUNTER — Ambulatory Visit: Payer: Self-pay | Attending: Family Medicine | Admitting: Family Medicine

## 2016-03-22 ENCOUNTER — Encounter: Payer: Self-pay | Admitting: Family Medicine

## 2016-03-22 VITALS — BP 129/81 | HR 85 | Temp 98.3°F | Resp 18 | Ht 70.0 in | Wt 148.2 lb

## 2016-03-22 DIAGNOSIS — R053 Chronic cough: Secondary | ICD-10-CM

## 2016-03-22 DIAGNOSIS — Z8701 Personal history of pneumonia (recurrent): Secondary | ICD-10-CM

## 2016-03-22 DIAGNOSIS — Z23 Encounter for immunization: Secondary | ICD-10-CM

## 2016-03-22 DIAGNOSIS — R05 Cough: Secondary | ICD-10-CM

## 2016-03-22 LAB — BASIC METABOLIC PANEL
BUN: 12 mg/dL (ref 7–25)
CHLORIDE: 102 mmol/L (ref 98–110)
CO2: 24 mmol/L (ref 20–31)
Calcium: 9.5 mg/dL (ref 8.6–10.3)
Creat: 0.91 mg/dL (ref 0.60–1.35)
Glucose, Bld: 93 mg/dL (ref 65–99)
POTASSIUM: 5 mmol/L (ref 3.5–5.3)
SODIUM: 136 mmol/L (ref 135–146)

## 2016-03-22 LAB — CBC
HCT: 39.2 % (ref 38.5–50.0)
HEMOGLOBIN: 13 g/dL — AB (ref 13.2–17.1)
MCH: 32.3 pg (ref 27.0–33.0)
MCHC: 33.2 g/dL (ref 32.0–36.0)
MCV: 97.3 fL (ref 80.0–100.0)
MPV: 9 fL (ref 7.5–12.5)
Platelets: 290 10*3/uL (ref 140–400)
RBC: 4.03 MIL/uL — ABNORMAL LOW (ref 4.20–5.80)
RDW: 14.5 % (ref 11.0–15.0)
WBC: 6.3 10*3/uL (ref 3.8–10.8)

## 2016-03-22 MED ORDER — LEVOFLOXACIN 750 MG PO TABS: 750.0000 mg | ORAL_TABLET | Freq: Every day | ORAL | 0 refills | Status: DC

## 2016-03-22 MED FILL — levoFLOXacin 750 MG TABS: 750 | 5 days supply | Qty: 5 | Fill #0

## 2016-03-22 NOTE — Progress Notes (Signed)
Subjective:  Patient ID: William Nguyen, male    DOB: 04/12/1970  Age: 46 y.o. MRN: [id number]  CC: Establish Care   HPI William Nguyen presents for to establish care. Reports recent history of pneumonia. Complains of cough and runny nose for 4 days. Reports cough is productive with green sputum. Denies any fevers, hemoptysis, sinus pressure, nasal congestion, sore throat or shortness of breath. He is a current smoker half a pack a day for over 20 years. Recent history of appointment with specialist where it was recommended he have a repeat CT of the chest to determine if pneumonia has resolved.      Outpatient Medications Prior to Visit  Medication Sig Dispense Refill  . traMADol (ULTRAM) 50 MG tablet Take 1 tablet (50 mg total) by mouth every 6 (six) hours as needed. 30 tablet 0   No facility-administered medications prior to visit.     ROS Review of Systems  Constitutional: Negative.   HENT: Positive for hearing loss (history of deafness left ear ) and rhinorrhea. Negative for sinus pressure and sore throat.   Eyes: Negative.   Respiratory: Positive for cough.   Cardiovascular: Negative.   Gastrointestinal: Negative.      Objective:  BP 129/81 (BP Location: Left Arm, Patient Position: Sitting, Cuff Size: Normal)   Pulse 85   Temp 98.3 F (36.8 C) (Oral)   Resp 18   Ht 5\' 10"  (1.778 m)   Wt 148 lb 3.2 oz (67.2 kg)   SpO2 97%   BMI 21.26 kg/m   BP/Weight 03/22/2016 03/13/2016 [id number]  Systolic BP [id number]  Diastolic BP 81 91 84  Wt. (Lbs) 148.2 150.2 150  BMI 21.26 21.55 21.52     Physical Exam  Constitutional: He appears well-developed and well-nourished.  HENT:  Head: Normocephalic.  Right Ear: External ear normal.  Left Ear: External ear normal.  Nose: Nose normal.  Mouth/Throat: Posterior oropharyngeal erythema present. No oropharyngeal exudate.  Eyes: Conjunctivae are normal. Pupils are equal, round, and reactive to light.  Cardiovascular: Normal  rate, regular rhythm, normal heart sounds and intact distal pulses.   Pulmonary/Chest: Effort normal and breath sounds normal.  Abdominal: Soft. Bowel sounds are normal.  Skin: Skin is warm and dry.  Nursing note and vitals reviewed.  Assessment & Plan:   Problem List Items Addressed This Visit    None    Visit Diagnoses    History of pneumonia    -  Primary   Relevant Medications   levofloxacin (LEVAQUIN) 750 MG tablet   Other Relevant Orders   CT CHEST W CONTRAST   CBC (Completed)   Basic Metabolic Panel (Completed)   Respiratory virus panel (Completed)   Chronic cough       Relevant Medications   levofloxacin (LEVAQUIN) 750 MG tablet   Needs flu shot       Relevant Orders   Flu Vaccine QUAD 36+ mos PF IM (Fluarix & Fluzone Quad PF) (Completed)      Meds ordered this encounter  Medications  . DISCONTD: levofloxacin (LEVAQUIN) 750 MG tablet    Sig: Take 1 tablet (750 mg total) by mouth daily.    Dispense:  5 tablet    Refill:  0    Order Specific Question:   Supervising Provider    Answer:   Tresa Garter [id number]  . levofloxacin (LEVAQUIN) 750 MG tablet    Sig: Take 1 tablet (750 mg total) by mouth daily.  Dispense:  5 tablet    Refill:  0    Order Specific Question:   Supervising Provider    Answer:   Tresa Garter [id number]    Follow-up: Return if symptoms worsen or fail to improve.   Alfonse Spruce FNP

## 2016-03-22 NOTE — Progress Notes (Signed)
Patient is here for F/UP  Patient complains about back pain going on for couple weeks  Patient complains about having a cough for 3 days now with a runny nose, stated that he have not had fevers  Patient have eaten today & have taking his tramadol today

## 2016-03-22 NOTE — Patient Instructions (Addendum)
Keep scheduled appointments with oncology.  Levofloxacin tablets What is this medicine? LEVOFLOXACIN (lee voe FLOX a sin) is a quinolone antibiotic. It is used to treat certain kinds of bacterial infections. It will not work for colds, flu, or other viral infections. This medicine may be used for other purposes; ask your health care provider or pharmacist if you have questions. COMMON BRAND NAME(S): Levaquin, Levaquin Leva-Pak What should I tell my health care provider before I take this medicine? They need to know if you have any of these conditions: -bone problems -diabetes -history of low levels of potassium in the blood -irregular heartbeat -joint problems -kidney disease -liver disease -myasthenia gravis -seizures -tendon problems -tingling of the fingers or toes, or other nerve disorder -an unusual or allergic reaction to levofloxacin, other quinolone antibiotics, foods, dyes, or preservatives -pregnant or trying to get pregnant -breast-feeding How should I use this medicine? Take this medicine by mouth with a full glass of water. Follow the directions on the prescription label. This medicine can be taken with or without food. Take your medicine at regular intervals. Do not take your medicine more often than directed. Do not skip doses or stop your medicine early even if you feel better. Do not stop taking except on your doctor's advice. A special MedGuide will be given to you by the pharmacist with each prescription and refill. Be sure to read this information carefully each time. Talk to your pediatrician regarding the use of this medicine in children. While this drug may be prescribed for children as young as 6 months for selected conditions, precautions do apply. Overdosage: If you think you have taken too much of this medicine contact a poison control center or emergency room at once. NOTE: This medicine is only for you. Do not share this medicine with others. What if I miss a  dose? If you miss a dose, take it as soon as you remember. If it is almost time for your next dose, take only that dose. Do not take double or extra doses. What may interact with this medicine? Do not take this medicine with any of the following medications: -bepridil -certain medicines for depression, anxiety, or psychotic disturbances like pimozide, thioridazine, and ziprasidone -certain medicines for irregular heart beat like dofetilide and dronedarone -cisapride -halofantrine This medicine may also interact with the following medications: -antacids -birth control pills -certain medicines for diabetes, like glipizide, glyburide, or insulin -didanosine buffered tablets or powder -multivitamins -NSAIDS, medicines for pain and inflammation, like ibuprofen or naproxen -steroid medicines like prednisone or cortisone -sucralfate -theophylline -warfarin This list may not describe all possible interactions. Give your health care provider a list of all the medicines, herbs, non-prescription drugs, or dietary supplements you use. Also tell them if you smoke, drink alcohol, or use illegal drugs. Some items may interact with your medicine. What should I watch for while using this medicine? Tell your doctor or healthcare professional if your symptoms do not start to get better or if they get worse. Do not treat diarrhea with over the counter products. Contact your doctor if you have diarrhea that lasts more than 2 days or if it is severe and watery. Check with your doctor or health care professional if you get an attack of severe diarrhea, nausea and vomiting, or if you sweat a lot. The loss of too much body fluid can make it dangerous for you to take this medicine. You may get drowsy or dizzy. Do not drive, use machinery, or do anything  that needs mental alertness until you know how this medicine affects you. Do not sit or stand up quickly, especially if you are an older patient. This reduces the  risk of dizzy or fainting spells. This medicine can make you more sensitive to the sun. Keep out of the sun. If you cannot avoid being in the sun, wear protective clothing and use a sunscreen. Do not use sun lamps or tanning beds/booths. Contact your doctor if you get a sunburn. If you are a diabetic monitor your blood glucose carefully. If you get an unusual reading stop taking this medicine and call your doctor right away. Avoid antacids, calcium, iron, and zinc products for 2 hours before and 2 hours after taking a dose of this medicine. What side effects may I notice from receiving this medicine? Side effects that you should report to your doctor or health care professional as soon as possible: -allergic reactions like skin rash or hives, swelling of the face, lips, or tongue -anxious -breathing problems -confusion -depressed mood -diarrhea -dizziness -fast, irregular heartbeat -hallucination, loss of contact with reality -joint, muscle, or tendon pain or swelling -muscle weakness -pain, tingling, numbness in the hands or feet -seizures -signs and symptoms of high blood sugar such as dizziness; dry mouth; dry skin; fruity breath; nausea; stomach pain; increased hunger or thirst; increased urination -signs and symptoms of liver injury like dark yellow or brown urine; general ill feeling or flu-like symptoms; light-colored stools; loss of appetite; nausea; right upper belly pain; unusually weak or tired; yellowing of the eyes or skin -signs and symptoms of low blood sugar such as feeling anxious; confusion; dizziness; increased hunger; unusually weak or tired; sweating; shakiness; cold; irritable; headache; blurred vision; fast heartbeat; loss of consciousness -suicidal thoughts or other mood changes -sunburn -unusually weak or tired Side effects that usually do not require medical attention (report to your doctor or health care professional if they continue or are  bothersome): -constipation -dry mouth -headache -nausea, vomiting -trouble sleeping This list may not describe all possible side effects. Call your doctor for medical advice about side effects. You may report side effects to FDA at 1-800-FDA-1088. Where should I keep my medicine? Keep out of the reach of children. Store at room temperature between 15 and 30 degrees C (59 and 86 degrees F). Keep in a tightly closed container. Throw away any unused medicine after the expiration date. NOTE: This sheet is a summary. It may not cover all possible information. If you have questions about this medicine, talk to your doctor, pharmacist, or health care provider.  2017 Elsevier/Gold Standard (2015-08-02 12:38:27) CT Scan A CT scan is a kind of X-ray that makes pictures of parts of your body. It is a painless way for your doctor to see inside your body. The CT scanner is a large machine that has an opening. Your body moves through the opening so that pictures can be taken. BEFORE THE PROCEDURE  The day before the test, do not have drinks with caffeine. Drinks with caffeine include energy drinks, tea, soda, coffee, and hot chocolate.  On the day of the test:  Do not eat at least 4 hours before the test or as told by your doctor.  Do not drink anything but water at least 4 hours before the test or as told by your doctor.  Leave your jewelry at home. You will have to take off some or all of your clothing. Your doctor will give you a hospital gown to wear.  PROCEDURE   You will lie on a table with your arms above your head.  An IV tube may be put in your arm. If this is done, liquid will be put through the tube. It may make you feel warm or give you a metal taste in your mouth.  The table you will be lying on will move into a large machine.  You will be able to see, hear, and talk to the person running the machine while you are in it. Follow that person's directions.  The machine will move around  you to take pictures. Do not move.  When the machine is done taking pictures it will be turned off.  The table will be moved out of the machine.  If you had an IV tube put in, it will be removed. AFTER THE PROCEDURE Ask your doctor when to call or come in for your test results. This information is not intended to replace advice given to you by your health care provider. Make sure you discuss any questions you have with your health care provider. Document Released: 04/20/2008 Document Revised: 01/27/2013 Document Reviewed: 10/08/2012 Elsevier Interactive Patient Education  2017 [employer].

## 2016-03-23 LAB — RESPIRATORY VIRUS PANEL
ADENOVIRUS B: NOT DETECTED
INFLUENZA A H1: NOT DETECTED
Influenza A H3: NOT DETECTED
Influenza A: NOT DETECTED
Influenza B: NOT DETECTED
METAPNEUMOVIRUS: NOT DETECTED
PARAINFLUENZA 3 A: NOT DETECTED
Parainfluenza 1: NOT DETECTED
Parainfluenza 2: NOT DETECTED
RHINOVIRUS: NOT DETECTED
Respiratory Syncytial Virus A: NOT DETECTED
Respiratory Syncytial Virus B: NOT DETECTED

## 2016-03-26 ENCOUNTER — Ambulatory Visit (HOSPITAL_COMMUNITY)
Admission: RE | Admit: 2016-03-26 | Discharge: 2016-03-26 | Disposition: A | Discharge status: Home / Self Care | Payer: Self-pay | Source: Ambulatory Visit | Attending: Family Medicine | Admitting: Family Medicine

## 2016-03-26 ENCOUNTER — Encounter (HOSPITAL_COMMUNITY): Payer: Self-pay

## 2016-03-26 DIAGNOSIS — Z09 Encounter for follow-up examination after completed treatment for conditions other than malignant neoplasm: Secondary | ICD-10-CM | POA: Insufficient documentation

## 2016-03-26 DIAGNOSIS — Z8701 Personal history of pneumonia (recurrent): Secondary | ICD-10-CM | POA: Insufficient documentation

## 2016-03-26 DIAGNOSIS — I2510 Atherosclerotic heart disease of native coronary artery without angina pectoris: Secondary | ICD-10-CM | POA: Insufficient documentation

## 2016-03-26 DIAGNOSIS — R160 Hepatomegaly, not elsewhere classified: Secondary | ICD-10-CM | POA: Insufficient documentation

## 2016-03-26 MED ORDER — IOPAMIDOL (ISOVUE-300) INJECTION 61%
INTRAVENOUS | Status: AC
  Administered 2016-03-26: 75 mL
  Filled 2016-03-26: qty 75

## 2016-03-26 MED ORDER — SODIUM CHLORIDE 0.9 % IJ SOLN
INTRAMUSCULAR | Status: AC
  Filled 2016-03-26: qty 50

## 2016-03-28 ENCOUNTER — Ambulatory Visit: Payer: Self-pay | Admitting: Hematology

## 2016-03-28 ENCOUNTER — Other Ambulatory Visit: Payer: Self-pay

## 2016-03-29 ENCOUNTER — Telehealth: Payer: Self-pay | Admitting: *Deleted

## 2016-03-29 ENCOUNTER — Other Ambulatory Visit: Payer: Self-pay | Admitting: Family Medicine

## 2016-03-29 DIAGNOSIS — I2510 Atherosclerotic heart disease of native coronary artery without angina pectoris: Secondary | ICD-10-CM

## 2016-03-29 NOTE — Telephone Encounter (Signed)
MA informed patient of labs being normal and RSV panel being negative. Patient is aware of pneumonia being completely resolved. CT did show some hardening of the heart vessels and patient is aware of a referral being placed to cardiology. Medical Assistant left message on patient's home and cell voicemail. Voicemail states to give a call back to [place] with [facility] at [phone].

## 2016-03-29 NOTE — Telephone Encounter (Signed)
-----   Message from Alfonse Spruce, [place] sent at 03/29/2016  8:34 AM EST ----- -Lung CT showed pneumonia completely resolved, lungs show no abnormalities. CT did show some hardening of the heart vessels. You will be referred to cardiology. -Respiratory virus panel negative, it did not detect common respiratory viruses.  -Labs normal.

## 2016-04-05 ENCOUNTER — Other Ambulatory Visit (HOSPITAL_BASED_OUTPATIENT_CLINIC_OR_DEPARTMENT_OTHER): Payer: Self-pay

## 2016-04-05 DIAGNOSIS — R160 Hepatomegaly, not elsewhere classified: Secondary | ICD-10-CM

## 2016-04-05 HISTORY — PX: OTHER SURGICAL HISTORY: SHX169

## 2016-04-05 LAB — CBC WITH DIFFERENTIAL/PLATELET
BASO%: 1 % (ref 0.0–2.0)
BASOS ABS: 0.1 10*3/uL (ref 0.0–0.1)
EOS%: 6.1 % (ref 0.0–7.0)
Eosinophils Absolute: 0.4 10*3/uL (ref 0.0–0.5)
HCT: 38.8 % (ref 38.4–49.9)
HEMOGLOBIN: 13.5 g/dL (ref 13.0–17.1)
LYMPH%: 38.8 % (ref 14.0–49.0)
MCH: 34.1 pg — AB (ref 27.2–33.4)
MCHC: 34.7 g/dL (ref 32.0–36.0)
MCV: 98.2 fL — AB (ref 79.3–98.0)
MONO#: 0.8 10*3/uL (ref 0.1–0.9)
MONO%: 14 % (ref 0.0–14.0)
NEUT#: 2.4 10*3/uL (ref 1.5–6.5)
NEUT%: 40.1 % (ref 39.0–75.0)
Platelets: 306 10*3/uL (ref 140–400)
RBC: 3.95 10*6/uL — ABNORMAL LOW (ref 4.20–5.82)
RDW: 14.7 % — AB (ref 11.0–14.6)
WBC: 5.9 10*3/uL (ref 4.0–10.3)
lymph#: 2.3 10*3/uL (ref 0.9–3.3)

## 2016-04-05 LAB — COMPREHENSIVE METABOLIC PANEL
ALBUMIN: 4 g/dL (ref 3.5–5.0)
ALT: 13 U/L (ref 0–55)
AST: 24 U/L (ref 5–34)
Alkaline Phosphatase: 186 U/L — ABNORMAL HIGH (ref 40–150)
Anion Gap: 8 mEq/L (ref 3–11)
BUN: 12.1 mg/dL (ref 7.0–26.0)
CHLORIDE: 103 meq/L (ref 98–109)
CO2: 29 meq/L (ref 22–29)
Calcium: 9.5 mg/dL (ref 8.4–10.4)
Creatinine: 0.9 mg/dL (ref 0.7–1.3)
GLUCOSE: 74 mg/dL (ref 70–140)
Potassium: 4.7 mEq/L (ref 3.5–5.1)
SODIUM: 139 meq/L (ref 136–145)
Total Bilirubin: 0.61 mg/dL (ref 0.20–1.20)
Total Protein: 7.3 g/dL (ref 6.4–8.3)

## 2016-04-06 ENCOUNTER — Ambulatory Visit (HOSPITAL_COMMUNITY)
Admission: RE | Admit: 2016-04-06 | Discharge: 2016-04-06 | Disposition: A | Discharge status: Home / Self Care | Payer: Self-pay | Source: Ambulatory Visit | Attending: Hematology | Admitting: Hematology

## 2016-04-06 DIAGNOSIS — D134 Benign neoplasm of liver: Secondary | ICD-10-CM | POA: Insufficient documentation

## 2016-04-06 DIAGNOSIS — R160 Hepatomegaly, not elsewhere classified: Secondary | ICD-10-CM

## 2016-04-06 DIAGNOSIS — J189 Pneumonia, unspecified organism: Secondary | ICD-10-CM | POA: Insufficient documentation

## 2016-04-06 DIAGNOSIS — K7689 Other specified diseases of liver: Secondary | ICD-10-CM | POA: Insufficient documentation

## 2016-04-06 IMAGING — MR MR ABDOMEN WO/W CM
13 of 22 series · 20 of 48 positions shown · IV contrast (Yes)
Comparison: Outside hospital CT abdomen/ pelvis dated 02/20/2016.
MR abdomen dated 08/15/2011.

CLINICAL DATA: Liver lesion in the right hepatic lobe

EXAM:
MRI ABDOMEN WITHOUT AND WITH CONTRAST
TECHNIQUE: Multiplanar multisequence MR imaging of the abdomen was performed
both before and after the administration of intravenous contrast.
CONTRAST:  7 mL Eovist IV

[Series 3: T2 fat-sat · axial · 5.0mm · 0.78mm/px · 1 of 52 slices shown]
[im 1/52]
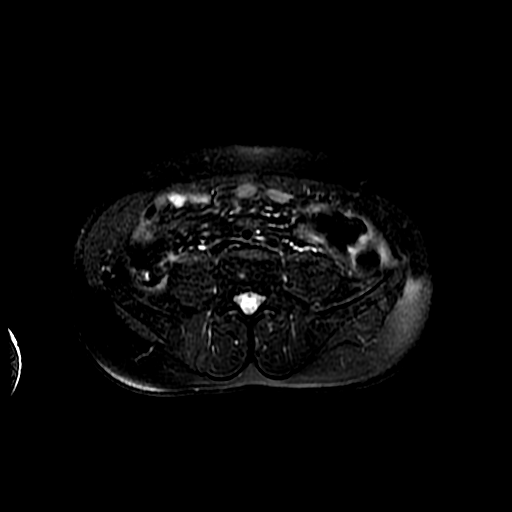

[Series 4: DWI b500 · axial · 6.0mm · 1.48mm/px · 1 of 70 slices shown]
[im 1/70]
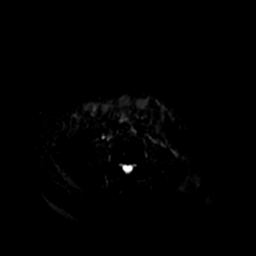

[Series 7: ax dualecho · axial · 5.0mm · 0.78mm/px · z∈[-142,+118]mm · 2 of 106 slices shown]
[im 1/106]
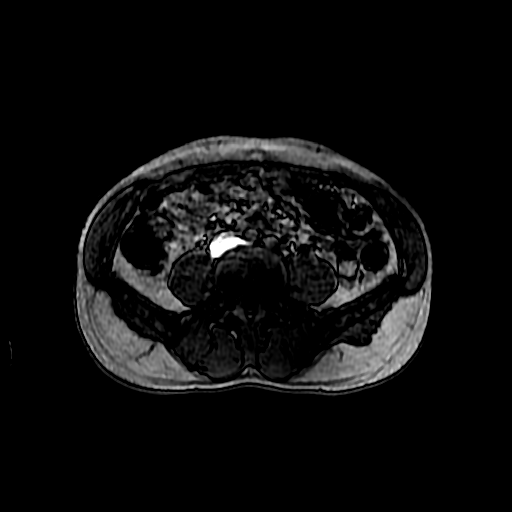
[im 106/106]
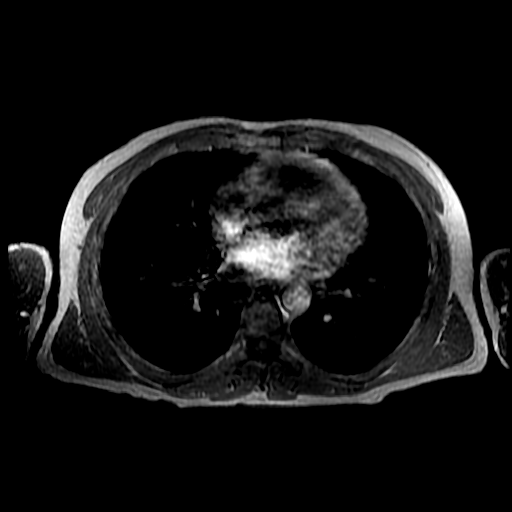

[Series 8: T2 · coronal · 5.0mm · 0.78mm/px · 1 of 44 slices shown (1 of 2)]
[im 1/44]
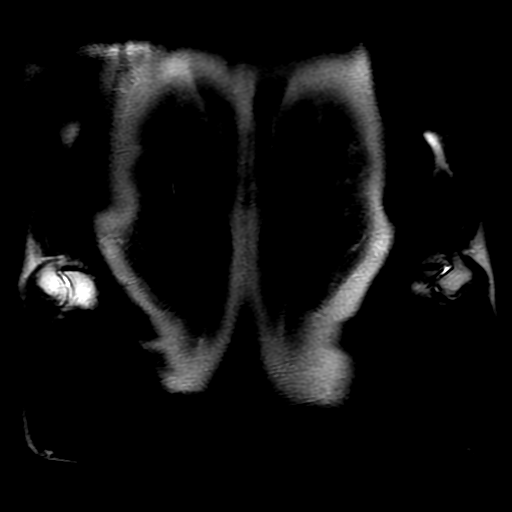

[Series 9: T2 · axial · 5.0mm · 0.78mm/px · 1 of 53 slices shown (2 of 2)]
[im 1/53]
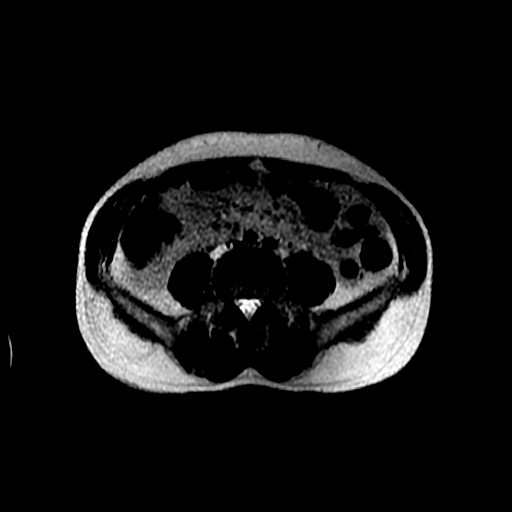

[Series 10: bSSFP · axial · 5.0mm · 0.78mm/px · 1 of 53 slices shown]
[im 1/53]
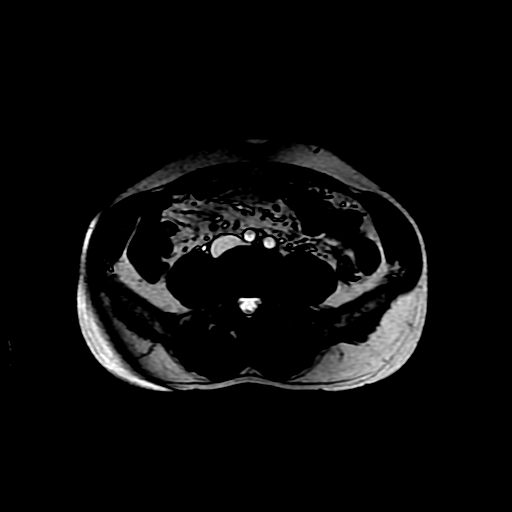

[Series 15: T1 dynamic · coronal · delayed · 5.0mm · 0.78mm/px · 1 of 88 slices shown (1 of 5)]
[im 1/88]
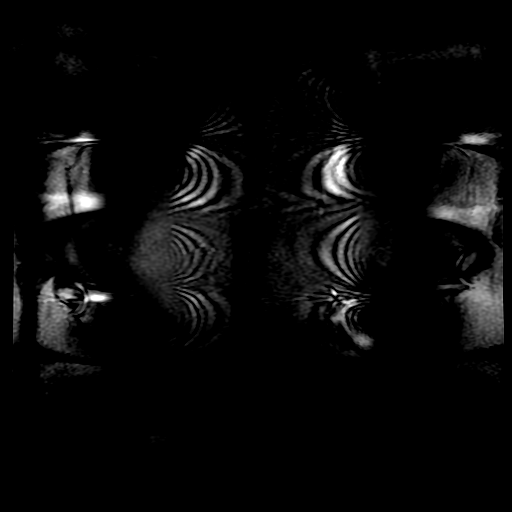

[Series 16: T1 dynamic · axial · delayed · 5.0mm · 0.78mm/px · z∈[-134,+103]mm · 2 of 96 slices shown (2 of 5)]
[im 1/96]
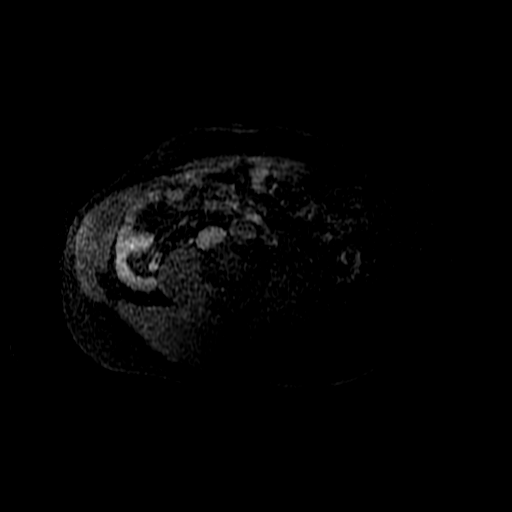
[im 96/96]
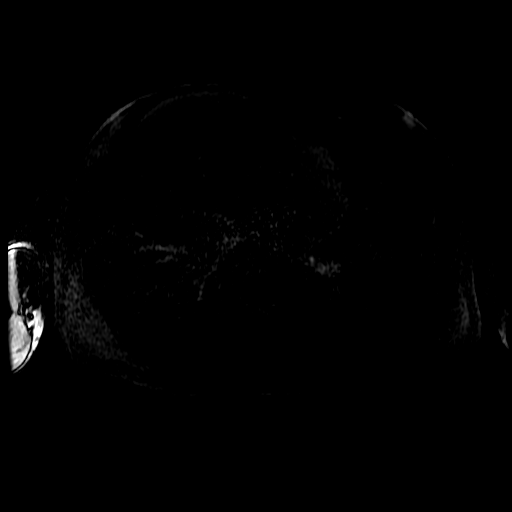

[Series 100: ((id)/(date)..96)-((id)/(id)/1..96) · axial · 5.0mm · 0.78mm/px · z∈[-134,+103]mm · 2 of 96 slices shown]
[im 1/96]
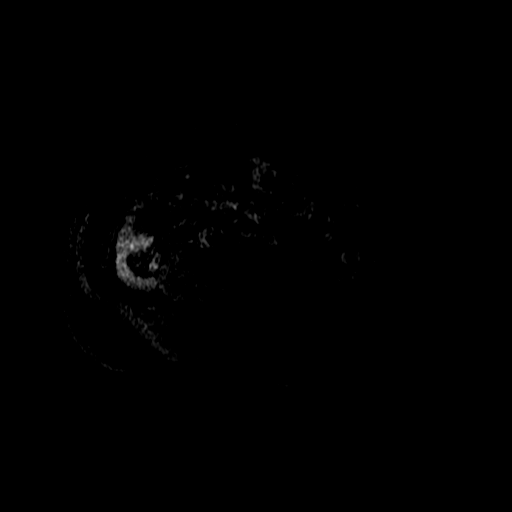
[im 96/96]
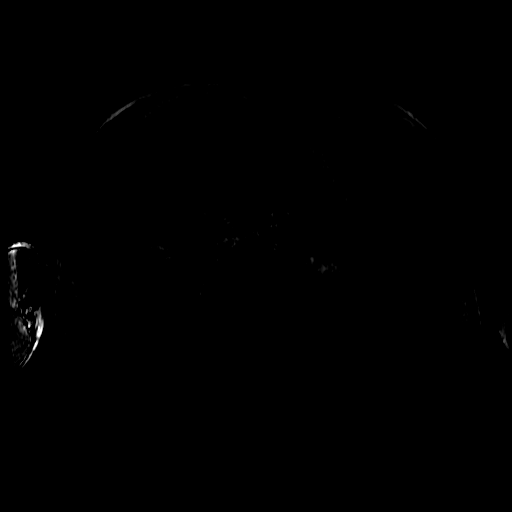

[Series 400: DWI · axial · 6.0mm · 1.48mm/px · 1 of 35 slices shown]
[im 1/35]
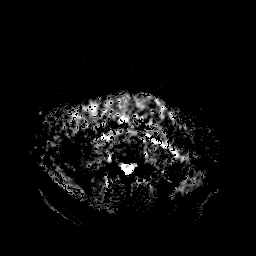

[Series 1100: T1 dynamic · axial · 5.0mm · 0.78mm/px · z∈[-134,+103]mm · 3 of 96 slices shown (3 of 5)]
[im 1/96]
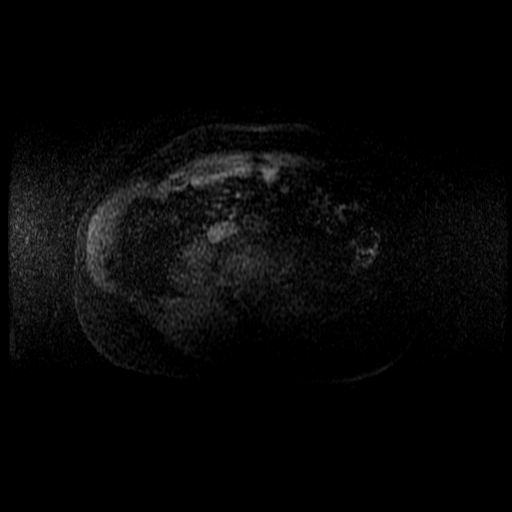
[im 48/96]
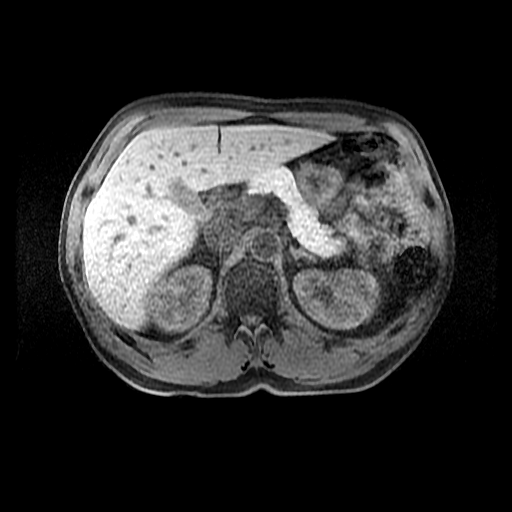
[im 96/96]
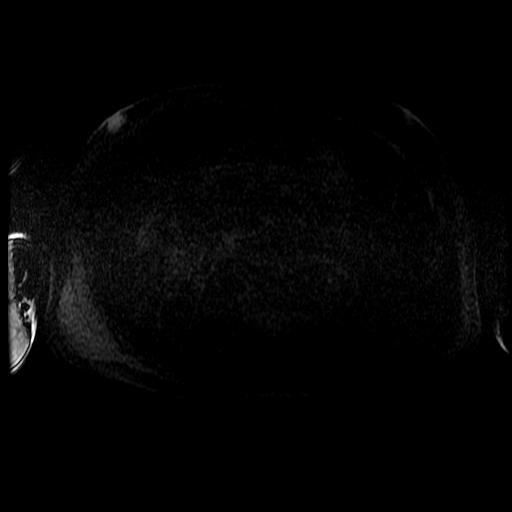

[Series 1200: T1 dynamic · axial · 5.0mm · 0.78mm/px · z∈[-134,+103]mm · 3 of 96 slices shown (4 of 5)]
[im 1/96]
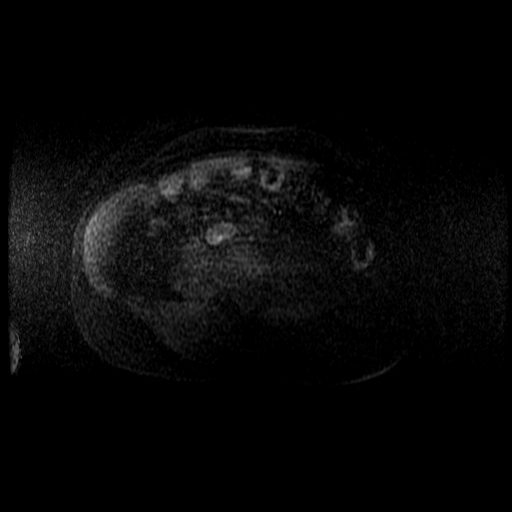
[im 48/96]
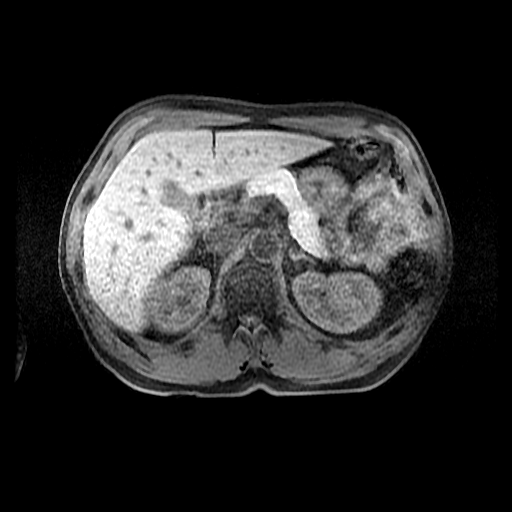
[im 96/96]
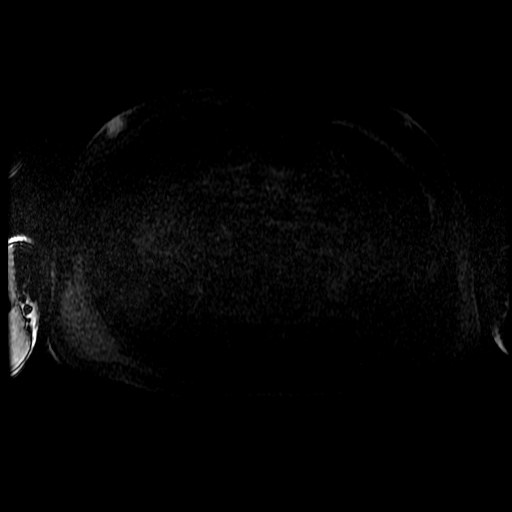

[Series 1201: T1 dynamic · axial · 5.0mm · 0.78mm/px · 1 of 96 slices shown (5 of 5)]
[im 1/96]
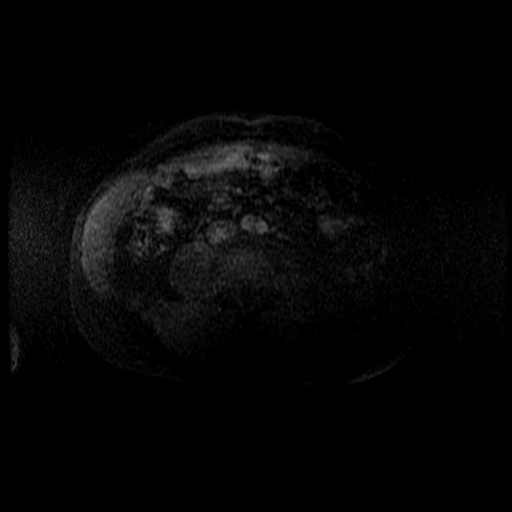

[20 of 48 positions shown; findings below may reference images not displayed]

FINDINGS: Lower chest: Lung bases are clear.

Hepatobiliary: 4.5 x 5.7 x 5.5 cm lesion in the inferior aspect of
segment 6, previously 3.9 x 4.5 x 4.9 cm. Moderate conspicuity with
heterogeneous appearance on T2 (series 3/image 34). Intrinsic T1
hyperintensity on precontrast imaging (series 1100/image 74),
suggesting intralesional hemorrhage. No definite signal loss on
opposed phase imaging to suggest intracellular lipid, although
sensitivity for this finding can be limited in the setting of
intralesional hemorrhage/hemosiderin deposition. No definite central
scar. No retained Eovist contrast on delayed imaging (series
100/image 66).

Overall, these findings favor benign hepatic adenoma over FNH,
likely with superimposed intralesional hemorrhage.

No morphologic findings of cirrhosis.  No hepatic steatosis.

Gallbladder is unremarkable. No intrahepatic or extrahepatic ductal
dilatation.

Pancreas:  Within normal limits.

Spleen:  Within normal limits.

Adrenals/Urinary Tract:  Adrenal glands are within normal limits.

Kidneys are within normal limits.  No hydronephrosis.

Stomach/Bowel: Stomach is within normal limits.

Visualized bowel is unremarkable.

Vascular/Lymphatic:  No evidence of abdominal aortic aneurysm.

No suspicious abdominal lymphadenopathy.

Other:  No abdominal ascites.

Musculoskeletal: No focal osseous lesions.
IMPRESSION: Suspected 5.7 cm benign hepatic adenoma in segment 6, with
superimposed intralesional hemorrhage, previously 4.9 cm in 3064.

While hepatic adenomas are statistically less common in this patient
population, the imaging characteristics do not support the diagnosis
of FNH.

Given size and associated risk of hemorrhage/rupture, surgical
consultation is suggested.

## 2016-04-06 MED ORDER — GADOXETATE DISODIUM 0.25 MMOL/ML IV SOLN
10.0000 mL | Freq: Once | INTRAVENOUS | Status: AC | PRN
  Administered 2016-04-06: 7 mL via INTRAVENOUS

## 2016-04-06 MED ORDER — GADOBENATE DIMEGLUMINE 529 MG/ML IV SOLN: 15.0000 mL | Freq: Once | INTRAVENOUS | Status: DC

## 2016-04-12 NOTE — Progress Notes (Signed)
[place]  [phone] [phone]  Clinic Follow Up Note   Patient Care Team: Pcp Not In System as PCP - General 04/13/2016  CHIEF COMPLAINTS:  Follow up liver Mass  HISTORY OF PRESENTING ILLNESS (03/13/16):  William Nguyen 46 y.o. male is here because of a liver mass. He is accompanied by his mother to my clinic today.   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 He began experiencing increasing cough, congestion and fevers up to 103. He had a chest x-ray done there that showed masslike bilateral airspace opacities and a CT scan was recommended of his chest. The CT of the chest showed airspace opacities in the left upper lobe, right middle lobe, and right lower lobe. They actually thought this may be a pneumonia and he was treated with azithromycin for 7 days. He also underwent CT of the abdomen and pelvis. This showed a 5.6 x 4.8 x 5.9 cm liver lesion. Also suggested a thickened gallbladder but no evidence of cholecystitis on GB US. His kidneys were okay and incidentally had a left inguinal hernia found. He was discharged and told that he needed to find a PCP ASAP to be referred to an oncologist.  Pt presents with his mother. He still has a cough and some SOB with exertion. He has some cramping and pain that comes and goes on the right side of his abdomen. He ranks it a 5/10. It happens every day, but isn't constant. He doesn't take any pain medication. His appetite has been good, but he has been very tired. He has not been able to work due to this fatigue and pain. Pt has some nausea. His bowel movements are normal. He has had a lot of headaches since his fever in [place]. He lost 10 pounds in the last month. Denies chest pain, black stool, blood in the stool, leg swelling, mouth sores, difficulty swallowing, or any other concerns. He hasn't been to see a PCP in several years. He has  only been to the doctor for a stomach ulcer he had in 2014 or 2015.   He smokes 1/2 pack per day of cigarettes. He use to smoke 1 ppd for about 20 years. Every other day he drinks a six pack of beer, heavier in the past. He doesn't drink liquor or wine. On weekends he drinks more than normal. He has been drinking about 25 years. He is single and has 2 kids. No history of liver disease. No recreational drug use. NKA. He is not on any medications other than one pill for pain. 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 It is worse in his left ear.    Interval History: The patient returns today for follow up. He feels great. Everything has now resolved. His pneumonia has cleared, and his liver scan showed a benign tumor. There is some bleeding in the tumor and he needs surgery to ensure it is Adenoma    MEDICAL HISTORY:  Past Medical History:  Diagnosis Date  . Ulcer ([place])    gastric ulcers    SURGICAL HISTORY: History reviewed. No pertinent surgical history.  SOCIAL HISTORY: Social History   Social History  . Marital status: Single    Spouse name: N/A  . Number of children: N/A  . Years of education: N/A   Occupational History  . Not on file.   Social History Main Topics  . Smoking  status: Current Every Day Smoker    Packs/day: 1.00    Years: 25.00    Types: Cigarettes  . Smokeless tobacco: Never Used  . Alcohol use 3.6 oz/week    6 Cans of beer per week     Comment: every other day; heavier in past, for the past 20 years   . Drug use: No  . Sexual activity: Not on file   Other Topics Concern  . Not on file   Social History Narrative  . No narrative on file    FAMILY HISTORY: History reviewed. No pertinent family history.  ALLERGIES:  has No Known Allergies.  MEDICATIONS:  Current Outpatient Prescriptions  Medication Sig Dispense Refill  . traMADol (ULTRAM) 50 MG tablet Take 1 tablet (50 mg total) by mouth every 6 (six) hours as needed. (Patient not  taking: Reported on 04/13/2016) 30 tablet 0   No current facility-administered medications for this visit.     REVIEW OF SYSTEMS:   Constitutional: Denies fevers, chills or abnormal night sweats  Eyes: Denies blurriness of vision, double vision or watery eyes Ears, nose, mouth, throat, and face: Denies mucositis or sore throat Respiratory: Denies cough, dyspnea or wheezes (+) cough, SOB Cardiovascular: Denies palpitation, chest discomfort or lower extremity swelling Gastrointestinal:  Denies heartburn or change in bowel habits (+) right sided abdominal pain. Skin: Denies abnormal skin rashes Lymphatics: Denies new lymphadenopathy or easy bruising Neurological:Denies numbness, tingling or new weaknesses  Behavioral/Psych: Mood is stable, no new changes  All other systems were reviewed with the patient and are negative.  PHYSICAL EXAMINATION: ECOG PERFORMANCE STATUS: 0 - Asymptomatic  Vitals:   04/13/16 1003  BP: (!) 128/91  Pulse: 89  Resp: 18  Temp: 98.4 F (36.9 C)   Filed Weights   04/13/16 1003  Weight: 151 lb 3.2 oz (68.6 kg)   GENERAL:alert, no distress and comfortable SKIN: skin color, texture, turgor are normal, no rashes or significant lesions EYES: normal, conjunctiva are pink and non-injected, sclera clear OROPHARYNX:no exudate, no erythema and lips, buccal mucosa, and tongue normal  NECK: supple, thyroid normal size, non-tender, without nodularity LYMPH:  no palpable lymphadenopathy in the cervical, axillary or inguinal LUNGS: clear to auscultation and percussion with normal breathing effort HEART: regular rate & rhythm and no murmurs and no lower extremity edema ABDOMEN:abdomen soft, normal bowel sounds (+) tenderness Musculoskeletal:no cyanosis of digits and no clubbing  PSYCH: alert & oriented x 3 with fluent speech NEURO: no focal motor/sensory deficits  LABORATORY DATA:  I have reviewed the data as listed CBC Latest Ref Rng & Units 04/05/2016 03/22/2016  07/30/2011  WBC 4.0 - 10.3 10e3/uL 5.9 6.3 13.4(H)  Hemoglobin 13.0 - 17.1 g/dL 13.5 13.0(L) 14.6  Hematocrit 38.4 - 49.9 % 38.8 39.2 41.1  Platelets 140 - 400 10e3/uL 306 290 401(H)    CMP Latest Ref Rng & Units 04/05/2016 03/22/2016 07/30/2011  Glucose 70 - 140 mg/dl 74 93 170(H)  BUN 7.0 - 26.0 mg/dL 12.1 12 12   Creatinine 0.7 - 1.3 mg/dL 0.9 0.91 0.72  Sodium 136 - 145 mEq/L 139 136 128(L)  Potassium 3.5 - 5.1 mEq/L 4.7 5.0 3.8  Chloride 98 - 110 mmol/L - 102 88(L)  CO2 22 - 29 mEq/L 29 24 25   Calcium 8.4 - 10.4 mg/dL 9.5 9.5 9.9  Total Protein 6.4 - 8.3 g/dL 7.3 - 8.7(H)  Total Bilirubin 0.20 - 1.20 mg/dL 0.61 - 0.5  Alkaline Phos 40 - 150 U/L 186(H) - 158(H)  AST  5 - 34 U/L 24 - 22  ALT 0 - 55 U/L 13 - 10     RADIOGRAPHIC STUDIES: I have personally reviewed the radiological report, the images were not available for me to review when he presents to my clinic  MR Abdomen W WO Contrast 04/06/16 IMPRESSION: 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 While hepatic adenomas are statistically less common in this patient population, the imaging characteristics do not support the diagnosis of FNH. Given size and associated risk of hemorrhage/rupture, surgical consultation is suggested.  CT Chest WO Contrast 04/06/16 IMPRESSION: Negative.  No evidence of pneumonia or other active disease.   CT CAP w/ Contrast 02/20/16 IMPRESSION: 1. 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  2. Contracted gallbladder with thickened wall and pericholecystic fluid. Consider evaluation with ultrasound to exclude acute cholecystitis.  3. Large left colon containing inguinal hernia. No evidence of strangulation or incarceration.  4. Small volume simple appearing free fluid within the pelvis likely secondary to a combination of the above findings.   PA  and lateral views of the chest 02/19/16 IMPRESSION: 1. Masslike bilateral airspace opacities. While this is favored to represent infection, malignancy cannot be excluded. Recommend CT of the chest with contrast.   MRI abdomen w and wo contrast 08/15/2011 IMPRESSION: 1.  Hyperenhancing lesion within the right hepatic lobe is again identified.  The signal and enhancement characteristics favor a liver adenoma or focal nodular hyperplasia. A more specific assessment  for focal nodular aplasia may be obtained with contrast enhanced MRI using Eovist contrast material.  ASSESSMENT & PLAN:  46 y.o. male with a history of smoking and drinking for 20+ years, presents with:  1. Liver adenoma  -I reviewed his repeated liver MRI scan findings with pt and his mother, which is most consistent with liver adenoma, this was presented in his previous MR scan in 2013, although size of his tumor has slightly increased. -He is a scan was previously reviewed in our GI tumor Board, and the consensus is likely benign. -Based on his scan findings, I did not recommend liver biopsy. -Due to size (>5cm) and intratumoral bleeding, I recommend him to see surgeon Dr. Barry Dienes to consider surgical resection, to prevent future tumor rupture and bleeding. - Discussed other options, including referral to interventional radiology for embolization or ablation, as well as close monitoring. -After lengthy discussion, patient agrees to see Dr. Barry Dienes for consultation.  2. Multilobar pneumonia -he previously completed a course of antibiotics, and his symptoms are near resolved -I discussed his repeated CT scan findings, which was negative. - His pneumonia is now resolved   Plan - I will refer to Dr. Barry Dienes for consultation of surgical resection of his hepatic adenoma  -I strongly encouraged him to establish his care with her primary care physician. I'll see him as needed in the future.  All questions were answered. The patient  knows to call the clinic with any problems, questions or concerns.  I spent 20 minutes counseling the patient face to face. The total time spent in the appointment was 30 minutes and more than 50% was on counseling.  I have reviewed the above documentation for accuracy and completeness and I agree with the above.  This document serves as a record of services personally performed by Truitt Merle, MD. It was created on her behalf by Maryla Morrow, a trained medical  scribe. The creation of this record is based on the scribe's personal observations and the provider's statements to them. This document has been checked and approved by the attending provider.  Truitt Merle  04/13/2016

## 2016-04-13 ENCOUNTER — Ambulatory Visit (HOSPITAL_BASED_OUTPATIENT_CLINIC_OR_DEPARTMENT_OTHER): Payer: Self-pay | Admitting: Hematology

## 2016-04-13 ENCOUNTER — Encounter: Payer: Self-pay | Admitting: Hematology

## 2016-04-13 DIAGNOSIS — R634 Abnormal weight loss: Secondary | ICD-10-CM

## 2016-04-13 DIAGNOSIS — J189 Pneumonia, unspecified organism: Secondary | ICD-10-CM

## 2016-04-13 DIAGNOSIS — Z720 Tobacco use: Secondary | ICD-10-CM

## 2016-04-13 DIAGNOSIS — F1010 Alcohol abuse, uncomplicated: Secondary | ICD-10-CM

## 2016-04-13 DIAGNOSIS — D134 Benign neoplasm of liver: Secondary | ICD-10-CM | POA: Insufficient documentation

## 2016-04-16 ENCOUNTER — Encounter: Payer: Self-pay | Admitting: Cardiology

## 2016-04-16 ENCOUNTER — Ambulatory Visit (INDEPENDENT_AMBULATORY_CARE_PROVIDER_SITE_OTHER): Payer: Self-pay | Admitting: Cardiology

## 2016-04-16 VITALS — BP 118/90 | HR 78 | Ht 70.0 in | Wt 145.0 lb

## 2016-04-16 DIAGNOSIS — R0609 Other forms of dyspnea: Secondary | ICD-10-CM | POA: Insufficient documentation

## 2016-04-16 DIAGNOSIS — F17210 Nicotine dependence, cigarettes, uncomplicated: Secondary | ICD-10-CM | POA: Insufficient documentation

## 2016-04-16 DIAGNOSIS — I2510 Atherosclerotic heart disease of native coronary artery without angina pectoris: Secondary | ICD-10-CM

## 2016-04-16 NOTE — Progress Notes (Signed)
PCP: Pcp Not In System  Clinic Note: Chief Complaint  Patient presents with  . New Patient (Initial Visit)    ct showed cad, occassional shortness of breath, NO chest pain, edema, pain or cramping in legs, lightheaded or dizziness    HPI: William Nguyen is a 46 y.o. male with a PMH below who presents today for initial cardiology evaluation for coronary artery calcification noted on CT scan..  Recent Hospitalizations: None  Studies Reviewed:   Chest CT March 2018: LAD coronary artery calcification. 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 Concern for intratumoral bleeding and has been referred to Dr. Barry Nguyen for resection.  Interval History: William Nguyen presents today for evaluation of his coronary calcification. He does note having some exertional dyspnea this being on for several years now. Mostly is with going up steps or up hill. He never notes it really with lot walking on flat ground, unless he goes along distance. He denies any resting or exertional chest tightness or pressure except when he has a cough. He does have a morning cough, but that is not very significant. No PND, orthopnea or edema.  No palpitations, lightheadedness, dizziness, weakness or syncope/near syncope. No TIA/amaurosis fugax symptoms. No claudication.  ROS: A comprehensive was performed. Review of Systems  Constitutional: Negative for malaise/fatigue and weight loss.  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 Negative for congestion, nosebleeds and sinus pain.   Respiratory: Positive for wheezing (Some wheezing, not much). Negative for sputum production.   Cardiovascular:       Per history of present illness  Gastrointestinal: Negative for blood in stool, constipation and melena.  Musculoskeletal: Positive for joint pain.       In AM - occ has difficulty moving foot (has h/o ankle fxr)  Neurological: Positive  for focal weakness (Left foot/ankle from history of fracture). Negative for dizziness.  Endo/Heme/Allergies: Does not bruise/bleed easily.  Psychiatric/Behavioral: Negative for depression and memory loss. The patient is not nervous/anxious and does not have insomnia.   All other systems reviewed and are negative.   Past Medical History:  Diagnosis Date  . Ulcer ([place])    gastric ulcers    History reviewed. No pertinent surgical history.  No outpatient prescriptions have been marked as taking for the 04/16/16 encounter (Office Visit) with William Man, MD.  - He has a prescription for Ultram that he does not take  No Known Allergies  Social History   Social History  . Marital status: Single    Spouse name: N/A  . Number of children: 2  . Years of education: N/A   Social History Main Topics  . Smoking status: Current Every Day Smoker    Packs/day: 1.00    Years: 25.00    Types: Cigarettes  . Smokeless tobacco: Never Used  . Alcohol use 3.6 oz/week    6 Cans of beer per week     Comment: every other day; heavier in past, for the past 20 years   . Drug use: No  . Sexual activity: Not Currently   Other Topics Concern  . None   Social History Narrative  . None    family history includes Healthy in his brother, father, mother, and sister; Heart disease in his paternal grandmother.  Wt Readings from Last 3 Encounters:  04/16/16 65.8 kg (145 lb)  04/13/16 68.6 kg (151 lb 3.2 oz)  03/22/16 67.2 kg (148 lb 3.2 oz)    PHYSICAL EXAM BP 118/90 (BP Location: Right Arm, Patient Position: Sitting, Cuff Size: Normal)   Pulse 78   Ht 5\' 10"  (1.778 m)   Wt 65.8 kg (145 lb)   BMI 20.81 kg/m  General appearance: alert, cooperative, appears stated age, no distress and otherwise well nourished / well-groomed. HEENT: [place]/AT, EOMI, MMM, anicteric sclera Neck: no adenopathy, no carotid bruit and no JVD Lungs: clear to auscultation bilaterally, normal percussion bilaterally and  non-labored; mild late exp wheezes Heart: regular rate and rhythm, S1& S2 normal, no murmur, click, rub or gallop; non-displaced PMI Abdomen: soft, non-tender; bowel sounds normal; no masses,  no organomegaly; no HJR Extremities: extremities normal, atraumatic, no cyanosis, oredema  Pulses: 2+ and symmetric;  Skin: mobility and turgor normal, no edema, no evidence of bleeding or bruising and no lesions noted  Neurologic: Mental status: Alert, oriented, thought content appropriate Cranial nerves: normal (II-XII grossly intact)    Adult ECG Report  Rate: 78 ;  Rhythm: normal sinus rhythm and Non-specific ST-T wave changes with borderline LVH criteria.;   Narrative Interpretation: relatively normal EKG   Other studies Reviewed: Additional studies/ records that were reviewed today include:  Recent Labs:  None available     ASSESSMENT / PLAN: Problem List Items Addressed This Visit    Coronary artery calcification seen on CAT scan - Primary (Chronic)    We discussed the significance of coronary artery calcification in the pathophysiology of coronary artery disease in general. Risk factors include smoking and being male. He does not have any other diagnoses, mostly because of lack of acquired history.  He does have some exertional dyspnea, but no anginal symptoms. For now the plan will be to evaluate physiologically if this coronary calcification is significant.  If not already checked we would need to check lipid panel and chemistry panel just to exclude dyslipidemia and hyperglycemia. Plan: Cardiopulmonary Exercise Test (CPX)      Relevant Orders   EKG 12-Lead (Completed)   Cardiopulmonary exercise test   Dyspnea on exertion    Mostly this is her's with walking up hill and is probably related to his smoking. With the coronary artery calcification, like to exclude it being a cardiac etiology.   Plan: CPX.      Relevant Orders   EKG 12-Lead (Completed)   Cardiopulmonary exercise  test   Tobacco smoker, less than 10 cigarettes per day (Chronic)    I counseled him on the importance of smoking cessation, especially in light of the fact that he is now diagnosed with having coronary artery disease although it may not be obstructive, there is still enough concern that hopefully this will convince him to quit. He is not yet ready to consider that.      Relevant Orders   EKG 12-Lead (Completed)   Cardiopulmonary exercise test      Current medicines are reviewed at length with the patient today. (+/- concerns) n/a The following changes have been made: n/a  Patient Instructions  SCHEDULE AT [facility] Your physician has recommended that you have a cardiopulmonary stress test (CPX). CPX testing is a non-invasive measurement of heart and lung function. It replaces a traditional treadmill stress test. This type of test provides a tremendous amount of information that relates not only to your present condition but also for future outcomes. This test combines measurements of you ventilation, respiratory gas exchange in the lungs, electrocardiogram (EKG), blood pressure and physical response  before, during, and following an exercise protocol.    Your physician recommends that you schedule a follow-up appointment in [place]      Studies Ordered:   Orders Placed This Encounter  Procedures  . Cardiopulmonary exercise test  . EKG 12-Lead      Glenetta Hew, M.D., M.S. Interventional Cardiologist   Pager # [phone] Phone # [phone] [address] [place], [place] [address]

## 2016-04-16 NOTE — Patient Instructions (Signed)
SCHEDULE AT [facility] Your physician has recommended that you have a cardiopulmonary stress test (CPX). CPX testing is a non-invasive measurement of heart and lung function. It replaces a traditional treadmill stress test. This type of test provides a tremendous amount of information that relates not only to your present condition but also for future outcomes. This test combines measurements of you ventilation, respiratory gas exchange in the lungs, electrocardiogram (EKG), blood pressure and physical response before, during, and following an exercise protocol.    Your physician recommends that you schedule a follow-up appointment in [place]

## 2016-04-18 ENCOUNTER — Encounter: Payer: Self-pay | Admitting: Cardiology

## 2016-04-18 NOTE — Assessment & Plan Note (Signed)
Mostly this is her's with walking up hill and is probably related to his smoking. With the coronary artery calcification, like to exclude it being a cardiac etiology.   Plan: CPX.

## 2016-04-18 NOTE — Assessment & Plan Note (Signed)
I counseled him on the importance of smoking cessation, especially in light of the fact that he is now diagnosed with having coronary artery disease although it may not be obstructive, there is still enough concern that hopefully this will convince him to quit. He is not yet ready to consider that.

## 2016-04-18 NOTE — Assessment & Plan Note (Signed)
We discussed the significance of coronary artery calcification in the pathophysiology of coronary artery disease in general. Risk factors include smoking and being male. He does not have any other diagnoses, mostly because of lack of acquired history.  He does have some exertional dyspnea, but no anginal symptoms. For now the plan will be to evaluate physiologically if this coronary calcification is significant.  If not already checked we would need to check lipid panel and chemistry panel just to exclude dyslipidemia and hyperglycemia. Plan: Cardiopulmonary Exercise Test (CPX)

## 2016-04-27 ENCOUNTER — Ambulatory Visit (HOSPITAL_COMMUNITY): Payer: Self-pay | Attending: Cardiology

## 2016-04-27 ENCOUNTER — Other Ambulatory Visit (HOSPITAL_COMMUNITY): Payer: Self-pay | Admitting: *Deleted

## 2016-04-27 DIAGNOSIS — R0609 Other forms of dyspnea: Principal | ICD-10-CM

## 2016-04-27 DIAGNOSIS — F17210 Nicotine dependence, cigarettes, uncomplicated: Secondary | ICD-10-CM

## 2016-04-27 DIAGNOSIS — I2510 Atherosclerotic heart disease of native coronary artery without angina pectoris: Secondary | ICD-10-CM

## 2016-04-27 DIAGNOSIS — R0600 Dyspnea, unspecified: Secondary | ICD-10-CM

## 2016-05-07 ENCOUNTER — Telehealth: Payer: Self-pay | Admitting: Hematology

## 2016-05-07 NOTE — Telephone Encounter (Signed)
Spoke with Caryl Pina at [employer] re referral. Per Caryl Pina faxed to [facility]. Notes will be reviewed via EPIC and office will contact patient re appointment.

## 2016-05-08 ENCOUNTER — Telehealth: Payer: Self-pay | Admitting: *Deleted

## 2016-05-08 NOTE — Telephone Encounter (Signed)
Received faxed appt from [facility] re:  Pt has appt with Dr. Barry Dienes at 9 am on  05/25/16.  [facility] scheduler had left message on voice mail for pt.

## 2016-05-10 ENCOUNTER — Telehealth: Payer: Self-pay | Admitting: Cardiology

## 2016-05-10 NOTE — Telephone Encounter (Signed)
Spoke to patient.  CPX Result given . Verbalized understanding PATIENT WOULD LIKE TO WAIT UNTIL APPOINTMENT ON 05/14/16- and then proceed with future test if needed

## 2016-05-10 NOTE — Telephone Encounter (Signed)
-----   Message from Leonie Man, MD sent at 05/08/2016  7:40 AM EDT ----- The exercise test did show both long and cardiovascular abnormalities. 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 High blood pressure response to exercise just that he is out of shape and possible cardiac dysfunction. The next step in evaluation would be an echocardiogram if not already ordered. We can then discuss whether or not we do a Myoview stress test versus cardiac cath at clinic followup.  Glenetta Hew, MD

## 2016-05-10 NOTE — Telephone Encounter (Signed)
New message ° ° ° °Pt is returning call about results. °

## 2016-05-14 ENCOUNTER — Encounter: Payer: Self-pay | Admitting: Cardiology

## 2016-05-14 ENCOUNTER — Ambulatory Visit (INDEPENDENT_AMBULATORY_CARE_PROVIDER_SITE_OTHER): Payer: No Typology Code available for payment source | Admitting: Cardiology

## 2016-05-14 VITALS — BP 120/76 | HR 90 | Ht 70.0 in | Wt 144.2 lb

## 2016-05-14 DIAGNOSIS — F17210 Nicotine dependence, cigarettes, uncomplicated: Secondary | ICD-10-CM

## 2016-05-14 DIAGNOSIS — R9439 Abnormal result of other cardiovascular function study: Secondary | ICD-10-CM | POA: Insufficient documentation

## 2016-05-14 DIAGNOSIS — R0609 Other forms of dyspnea: Secondary | ICD-10-CM

## 2016-05-14 DIAGNOSIS — I2510 Atherosclerotic heart disease of native coronary artery without angina pectoris: Secondary | ICD-10-CM

## 2016-05-14 DIAGNOSIS — Z9189 Other specified personal risk factors, not elsewhere classified: Secondary | ICD-10-CM

## 2016-05-14 MED ORDER — COQ10 100 MG PO CAPS: 300.0000 mg | ORAL_CAPSULE | Freq: Every day | ORAL | 11 refills | Status: DC

## 2016-05-14 NOTE — Progress Notes (Signed)
PCP: Pcp Not In System  Clinic Note: Chief Complaint  Patient presents with  . Follow-up CPX    pt states no Sx.  . Coronary Artery Disease    Coronary artery calcification on CT scan    HPI: William Nguyen is a 46 y.o. male with a PMH below who presents today for 1 month f/u after CPX testing. - had coronary calcification on CT Scan.  William Nguyen was last seen on 04/16/2016. He was noting some exertional dyspnea off and on going up hills and stairs. Doesn't like ground. No chest tightness or pressure.  Recent Hospitalizations: None  Studies Reviewed:    This CPX 04/27/2016: Exercise testing with gas exchange shows a moderate functional limitation due to a circulatory limitation -- Peak VO2 53%.  There were no ST-T wave changes with exercise. Resting spirometry suggests mild restriction. Hypertensive response to exercise. Suggest echocardiogram.  Interval History: William Nguyen presents today overall feeling quite well. He does have a little exertional dyspnea if he over does it he pushes himself too hard, but not with routine activity. He really did not notice much, if any symptoms during the stress test. No chest tightness or pressure, and no real dyspnea. He denies a PND, orthopnea or edema. No palpitations, lightheadedness, dizziness, weakness or syncope/near syncope. No TIA/amaurosis fugax symptoms.  No claudication.  He currently smokes about a half-pack of cigarettes a day, and has thought about quitting. He just needs some advice.  ROS: A comprehensive was performed. Review of Systems  Constitutional: Negative.   HENT: Negative for nosebleeds.   Respiratory: Positive for cough (From smoking).   Cardiovascular:       Negative per history of present illness  Gastrointestinal: Negative for blood in stool and melena.  Genitourinary: Negative for hematuria.  Musculoskeletal: Negative.   Psychiatric/Behavioral: Negative.   All other systems reviewed and are negative.   Past  Medical History:  Diagnosis Date  . Ulcer ([place])    gastric ulcers    Past Surgical History:  Procedure Laterality Date  . CardioPulmonary Exercise Test: CPX  04/2016   Exercise testing with gas exchange shows a moderate functional limitation due to a circulatory limitation -- Peak VO2 53%.  There were no ST-T wave changes with exercise. Resting spirometry suggests mild restriction. Hypertensive response to exercise.    No outpatient prescriptions have been marked as taking for the 05/14/16 encounter (Office Visit) with Leonie Man, MD.    No Known Allergies  Social History   Social History  . Marital status: Single    Spouse name: N/A  . Number of children: 2  . Years of education: N/A   Social History Main Topics  . Smoking status: Current Every Day Smoker    Packs/day: 1.00    Years: 25.00    Types: Cigarettes  . Smokeless tobacco: Never Used  . Alcohol use 3.6 oz/week    6 Cans of beer per week     Comment: every other day; heavier in past, for the past 20 years   . Drug use: No  . Sexual activity: Not Currently   Other Topics Concern  . None   Social History Narrative  . None   FAMILY History  family history includes Healthy in his brother, father, mother, and sister; Heart disease in his paternal grandmother.  Wt Readings from Last 3 Encounters:  05/14/16 144 lb 3.2 oz (65.4 kg)  04/16/16 145 lb (65.8 kg)  04/13/16 151 lb 3.2 oz (  68.6 kg)    PHYSICAL EXAM BP 120/76 (BP Location: Left Arm, Patient Position: Sitting, Cuff Size: Normal)   Pulse 90   Ht 5\' 10"  (1.778 m)   Wt 144 lb 3.2 oz (65.4 kg)   BMI 20.69 kg/m  General appearance: alert, cooperative, appears stated age, no distress and Otherwise healthy-appearing. Well-nourished well-groomed. He does smell like cigarettes.  Neck: no adenopathy, no carotid bruit and no JVD Lungs: clear to auscultation bilaterally, normal percussion bilaterally and non-labored Heart: regular rate and rhythm, S1, S2  normal, no murmur, click, rub or gallop; nondisplaced PMI Abdomen: soft, non-tender; bowel sounds normal; no masses,  no organomegaly; no HJR Extremities: extremities normal, atraumatic, no cyanosis, OR edema  Neurologic: Mental status: Alert, oriented, thought content appropriate. Normal mood and affect   Adult ECG Report N/A  Other studies Reviewed: Additional studies/ records that were reviewed today include:  Recent Labs:  No recent labs     ASSESSMENT / PLAN: Problem List Items Addressed This Visit    Abnormal finding on cardiovascular stress test - Primary    Peak VO2 of only 53% is concerning in a gentleman 46 years old. At this point he is not actively having anginal symptoms, so therefore we will plan for risk factor modification, and reevaluating the CPX next year. Depending on how that looks, we may need to proceed with more aggressive ischemic evaluation with Myoview nuclear stress test for for stress echo.  For now plan is check 2-D echocardiogram to evaluate systolic and diastolic function.      Relevant Orders   ECHOCARDIOGRAM COMPLETE   Lipid panel   Coronary artery calcification seen on CAT scan (Chronic)    Relatively asymptomatic, but intermediate risk is CPX. With no symptoms, I think we will start off with respect modification to begin with. I would like to recheck another one is testing about a year. For now we will simply check the echocardiogram recommended by the reading physician. We'll also check cardiac risk factors such as lipid panel.  Recommend initiating coenzyme Q 10 therapy which we can titrate up to 300 mg daily. If lipids warrant, would start statin after next visit - allowing 6 months of dietary modification and exercise.  Smoking cessation counseling        Dyspnea on exertion    Seems to be relatively stable, but abnormal CPX. Plan: Check 2-D echocardiogram to evaluate systolic and diastolic function      Relevant Orders    ECHOCARDIOGRAM COMPLETE   Lipid panel   Tobacco smoker, less than 10 cigarettes per day (Chronic)    Smoking cessation instruction/counseling given:  counseled patient on the dangers of tobacco use, advised patient to stop smoking, and reviewed strategies to maximize success -- discussed NicoDerm patches, [place] quit line as well as potential bleeding. Would prefer not to do baby held. If these do not work, in the future we'll consider Chantix. 4 minutes spent       Relevant Orders   Lipid panel    Other Visit Diagnoses    Cardiovascular risk factor       Relevant Orders   Lipid panel      Current medicines are reviewed at length with the patient today. (+/- concerns) None The following changes have been made: See below. We may start statin between now and next visit.  Patient Instructions  Medication Start taking CoQ10  300 MG daily -  Start off with 100 mg daily for two weeks and additional  100 mg every two weeks until you reach 300 mg daily.   Your physician discussed the hazards of tobacco use. Tobacco use cessation is recommended and techniques and options to help you quit were discussed.  May purchase over the counter - Nicoderm patch for smoking cessation - start 14 mg patch. Contact ( [place] QUIT line), contact cone website - smoking cessation class    SCHEDULE [place] [facility] Your physician has requested that you have an echocardiogram. Echocardiography is a painless test that uses sound waves to create images of your heart. It provides your doctor with information about the size and shape of your heart and how well your heart's chambers and valves are working. This procedure takes approximately one hour. There are no restrictions for this procedure.IF ABNORMAL APPOINTMENT SOONER THAN 6 MONTHS   PLEASE HAVE LAB-- (LIPID) NO THE IN TO EAT OR DRINK THE MORNING OF THE TEST--DONE [place] REGISTER AT REGISTRATION - MAIN ENTRANCE A.  Your physician wants  you to follow-up in 6 months with Dr Ellyn Hack. You will receive a reminder letter in the mail two months in advance. If you don't receive a letter, please call our office to schedule the follow-up appointment.       Studies Ordered:   Orders Placed This Encounter  Procedures  . Lipid panel  . ECHOCARDIOGRAM COMPLETE      Glenetta Hew, M.D., M.S. Interventional Cardiologist   Pager # [phone] Phone # [phone] [address]. [place],  [address]

## 2016-05-14 NOTE — Assessment & Plan Note (Addendum)
Smoking cessation instruction/counseling given:  counseled patient on the dangers of tobacco use, advised patient to stop smoking, and reviewed strategies to maximize success -- discussed NicoDerm patches, [place] quit line as well as potential bleeding. Would prefer not to do baby held. If these do not work, in the future we'll consider Chantix. 4 minutes spent

## 2016-05-14 NOTE — Assessment & Plan Note (Signed)
Peak VO2 of only 53% is concerning in a gentleman 46 years old. At this point he is not actively having anginal symptoms, so therefore we will plan for risk factor modification, and reevaluating the CPX next year. Depending on how that looks, we may need to proceed with more aggressive ischemic evaluation with Myoview nuclear stress test for for stress echo.  For now plan is check 2-D echocardiogram to evaluate systolic and diastolic function.

## 2016-05-14 NOTE — Assessment & Plan Note (Signed)
Seems to be relatively stable, but abnormal CPX. Plan: Check 2-D echocardiogram to evaluate systolic and diastolic function

## 2016-05-14 NOTE — Patient Instructions (Addendum)
Medication Start taking CoQ10  300 MG daily -  Start off with 100 mg daily for two weeks and additional 100 mg every two weeks until you reach 300 mg daily.   Your physician discussed the hazards of tobacco use. Tobacco use cessation is recommended and techniques and options to help you quit were discussed.  May purchase over the counter - Nicoderm patch for smoking cessation - start 14 mg patch. Contact ( [place] QUIT line), contact cone website - smoking cessation class    SCHEDULE [place] [facility] Your physician has requested that you have an echocardiogram. Echocardiography is a painless test that uses sound waves to create images of your heart. It provides your doctor with information about the size and shape of your heart and how well your heart's chambers and valves are working. This procedure takes approximately one hour. There are no restrictions for this procedure.IF ABNORMAL APPOINTMENT SOONER THAN 6 MONTHS   PLEASE HAVE LAB-- (LIPID) NO THE IN TO EAT OR DRINK THE MORNING OF THE TEST--DONE [place] REGISTER AT REGISTRATION - MAIN ENTRANCE A.  Your physician wants you to follow-up in 6 months with Dr Ellyn Hack. You will receive a reminder letter in the mail two months in advance. If you don't receive a letter, please call our office to schedule the follow-up appointment.

## 2016-05-14 NOTE — Assessment & Plan Note (Signed)
Relatively asymptomatic, but intermediate risk is CPX. With no symptoms, I think we will start off with respect modification to begin with. I would like to recheck another one is testing about a year. For now we will simply check the echocardiogram recommended by the reading physician. We'll also check cardiac risk factors such as lipid panel.  Recommend initiating coenzyme Q 10 therapy which we can titrate up to 300 mg daily. If lipids warrant, would start statin after next visit - allowing 6 months of dietary modification and exercise.  Smoking cessation counseling

## 2016-05-25 ENCOUNTER — Other Ambulatory Visit (HOSPITAL_COMMUNITY): Payer: Self-pay | Admitting: General Surgery

## 2016-05-25 DIAGNOSIS — R160 Hepatomegaly, not elsewhere classified: Secondary | ICD-10-CM

## 2016-05-28 ENCOUNTER — Ambulatory Visit (HOSPITAL_COMMUNITY): Payer: No Typology Code available for payment source | Attending: Cardiology

## 2016-05-28 ENCOUNTER — Other Ambulatory Visit: Payer: Self-pay

## 2016-05-28 DIAGNOSIS — I071 Rheumatic tricuspid insufficiency: Secondary | ICD-10-CM | POA: Insufficient documentation

## 2016-05-28 DIAGNOSIS — R0609 Other forms of dyspnea: Secondary | ICD-10-CM | POA: Insufficient documentation

## 2016-05-28 DIAGNOSIS — Z720 Tobacco use: Secondary | ICD-10-CM | POA: Insufficient documentation

## 2016-05-28 DIAGNOSIS — I2510 Atherosclerotic heart disease of native coronary artery without angina pectoris: Secondary | ICD-10-CM | POA: Insufficient documentation

## 2016-05-28 DIAGNOSIS — I340 Nonrheumatic mitral (valve) insufficiency: Secondary | ICD-10-CM | POA: Insufficient documentation

## 2016-05-28 DIAGNOSIS — R9439 Abnormal result of other cardiovascular function study: Secondary | ICD-10-CM

## 2016-05-28 NOTE — Progress Notes (Signed)
Echo results: Good news: Essentially normal echocardiogram and normal pump function and normal valve function. No regional wall motion abnormalities =  No signs to suggest heart attack.. EF: 55% is NORMAL (percentage of blood pumped out of the heart)  Glenetta Hew, MD

## 2016-05-29 ENCOUNTER — Telehealth: Payer: Self-pay | Admitting: *Deleted

## 2016-05-29 NOTE — Telephone Encounter (Signed)
-----   Message from Leonie Man, MD sent at 05/28/2016  5:45 PM EDT ----- Echo results: Good news: Essentially normal echocardiogram and normal pump function and normal valve function. No regional wall motion abnormalities =  No signs to suggest heart attack.. EF: 55% is NORMAL (percentage of blood pumped out of the heart)  Glenetta Hew, MD

## 2016-05-29 NOTE — Telephone Encounter (Signed)
LEFT DETAILED MESSAGE OF RESULT ON SECURE VOICE MAIL PER DPI. ANY QUESTION MAY CALL BACK

## 2016-06-06 ENCOUNTER — Other Ambulatory Visit: Payer: Self-pay | Admitting: Radiology

## 2016-06-06 ENCOUNTER — Other Ambulatory Visit: Payer: Self-pay | Admitting: General Surgery

## 2016-06-07 ENCOUNTER — Ambulatory Visit (HOSPITAL_COMMUNITY)
Admission: RE | Admit: 2016-06-07 | Discharge: 2016-06-07 | Disposition: A | Discharge status: Home / Self Care | Payer: Self-pay | Source: Ambulatory Visit | Attending: General Surgery | Admitting: General Surgery

## 2016-06-07 ENCOUNTER — Encounter (HOSPITAL_COMMUNITY): Payer: Self-pay

## 2016-06-07 DIAGNOSIS — R160 Hepatomegaly, not elsewhere classified: Secondary | ICD-10-CM | POA: Insufficient documentation

## 2016-06-07 DIAGNOSIS — D134 Benign neoplasm of liver: Secondary | ICD-10-CM | POA: Insufficient documentation

## 2016-06-07 LAB — PROTIME-INR
INR: 0.98
PROTHROMBIN TIME: 12.9 s (ref 11.4–15.2)

## 2016-06-07 LAB — COMPREHENSIVE METABOLIC PANEL
ALBUMIN: 3.8 g/dL (ref 3.5–5.0)
ALT: 22 U/L (ref 17–63)
ANION GAP: 10 (ref 5–15)
AST: 34 U/L (ref 15–41)
Alkaline Phosphatase: 169 U/L — ABNORMAL HIGH (ref 38–126)
BILIRUBIN TOTAL: 0.7 mg/dL (ref 0.3–1.2)
BUN: 9 mg/dL (ref 6–20)
CO2: 23 mmol/L (ref 22–32)
Calcium: 9.2 mg/dL (ref 8.9–10.3)
Chloride: 101 mmol/L (ref 101–111)
Creatinine, Ser: 0.87 mg/dL (ref 0.61–1.24)
GFR calc non Af Amer: >60 mL/min (ref >60)
GLUCOSE: 88 mg/dL (ref 65–99)
POTASSIUM: 4.8 mmol/L (ref 3.5–5.1)
Sodium: 134 mmol/L — ABNORMAL LOW (ref 135–145)
TOTAL PROTEIN: 7.4 g/dL (ref 6.5–8.1)

## 2016-06-07 LAB — CBC WITH DIFFERENTIAL/PLATELET
BASOS PCT: 1 %
Basophils Absolute: 0.1 10*3/uL (ref 0.0–0.1)
EOS ABS: 0.6 10*3/uL (ref 0.0–0.7)
Eosinophils Relative: 7 %
HEMATOCRIT: 42.8 % (ref 39.0–52.0)
Hemoglobin: 15 g/dL (ref 13.0–17.0)
Lymphocytes Relative: 33 %
Lymphs Abs: 2.8 10*3/uL (ref 0.7–4.0)
MCH: 33.5 pg (ref 26.0–34.0)
MCHC: 35 g/dL (ref 30.0–36.0)
MCV: 95.5 fL (ref 78.0–100.0)
MONO ABS: 1.1 10*3/uL — AB (ref 0.1–1.0)
MONOS PCT: 13 %
Neutro Abs: 4 10*3/uL (ref 1.7–7.7)
Neutrophils Relative %: 46 %
Platelets: 300 10*3/uL (ref 150–400)
RBC: 4.48 MIL/uL (ref 4.22–5.81)
RDW: 13.5 % (ref 11.5–15.5)
WBC: 8.5 10*3/uL (ref 4.0–10.5)

## 2016-06-07 MED ORDER — FENTANYL CITRATE (PF) 100 MCG/2ML IJ SOLN
INTRAMUSCULAR | Status: AC
  Filled 2016-06-07: qty 2

## 2016-06-07 MED ORDER — SODIUM CHLORIDE 0.9 % IV SOLN
INTRAVENOUS | Status: DC
  Administered 2016-06-07: 12:00:00 via INTRAVENOUS

## 2016-06-07 MED ORDER — FENTANYL CITRATE (PF) 100 MCG/2ML IJ SOLN
INTRAMUSCULAR | Status: AC | PRN
  Administered 2016-06-07: 50 ug via INTRAVENOUS
  Administered 2016-06-07 (×2): 25 ug via INTRAVENOUS

## 2016-06-07 MED ORDER — MIDAZOLAM HCL 2 MG/2ML IJ SOLN
INTRAMUSCULAR | Status: AC | PRN
  Administered 2016-06-07: 0.5 mg via INTRAVENOUS
  Administered 2016-06-07: 1 mg via INTRAVENOUS
  Administered 2016-06-07: 0.5 mg via INTRAVENOUS

## 2016-06-07 MED ORDER — OXYCODONE HCL 5 MG PO TABS
5.0000 mg | ORAL_TABLET | ORAL | Status: DC | PRN
  Administered 2016-06-07: 5 mg via ORAL

## 2016-06-07 MED ORDER — GELATIN ABSORBABLE 12-7 MM EX MISC
CUTANEOUS | Status: AC
  Filled 2016-06-07: qty 1

## 2016-06-07 MED ORDER — MIDAZOLAM HCL 2 MG/2ML IJ SOLN
INTRAMUSCULAR | Status: AC
  Filled 2016-06-07: qty 2

## 2016-06-07 MED ORDER — OXYCODONE HCL 5 MG PO TABS
ORAL_TABLET | ORAL | Status: AC
  Filled 2016-06-07: qty 1

## 2016-06-07 MED ORDER — LIDOCAINE HCL 1 % IJ SOLN
INTRAMUSCULAR | Status: AC
  Filled 2016-06-07: qty 20

## 2016-06-07 NOTE — Sedation Documentation (Signed)
Attempted to call report. Nurse unavailable. Will call back.

## 2016-06-07 NOTE — Consult Note (Signed)
Chief Complaint: Patient was seen in consultation today for liver lesion biopsy at the request of [place]  Referring Physician(s): Byerly,Faera  Supervising Physician: Markus Daft  Patient Status: [facility] - Out-pt  History of Present Illness: William Nguyen is a 46 y.o. 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 While hepatic adenomas are statistically less common in this patient population, the imaging characteristics do not support the diagnosis of FNH. Given size and associated risk of hemorrhage/rupture, surgical consultation is suggested.  Pt referred to Dr Barry Dienes She is requesting Bx before surgery to confirm diagnosis before surgery  Past Medical History:  Diagnosis Date  . Ulcer    gastric ulcers    Past Surgical History:  Procedure Laterality Date  . CardioPulmonary Exercise Test: CPX  04/2016   Exercise testing with gas exchange shows a moderate functional limitation due to a circulatory limitation -- Peak VO2 53%.  There were no ST-T wave changes with exercise. Resting spirometry suggests mild restriction. Hypertensive response to exercise.    Allergies: Patient has no known allergies.  Medications: Prior to Admission medications   Medication Sig Start Date End Date Taking? Authorizing Provider  Coenzyme Q10 (COQ10) 100 MG CAPS Take 300 mg by mouth daily. 05/14/16  Yes Leonie Man, MD     Family History  Problem Relation Age of Onset  . Healthy Mother   . Healthy Father   . Healthy Sister   . Healthy Brother   . Heart disease Paternal Grandmother     Unsure of  details - still living at age    Social History   Social History  . Marital status: Single    Spouse name: N/A  . Number of children: 2  . Years of education: N/A   Social History Main Topics  . Smoking status: Current Every Day Smoker    Packs/day: 1.00    Years: 25.00    Types: Cigarettes  . Smokeless tobacco: Never Used  . Alcohol use 3.6 oz/week    6 Cans of beer per week     Comment: every other day; heavier in past, for the past 20 years   . Drug use: No  . Sexual activity: Not Currently   Other Topics Concern  . None   Social History Narrative  . None    Review of Systems: A 12 point ROS discussed and pertinent positives are indicated in the HPI above.  All other systems are negative.  Review of Systems  Constitutional: Positive for appetite change. Negative for activity change, fatigue and fever.  Respiratory: Negative for cough and shortness of breath.   Cardiovascular: Negative for chest pain.  Gastrointestinal: Negative for abdominal pain.  Musculoskeletal: Negative for gait problem.  Neurological: Negative for weakness.  Psychiatric/Behavioral: Negative for behavioral problems and confusion.    Vital Signs: BP (!) 150/100   Pulse 77   Temp 98.7 F (37.1 C)   Resp 20   Ht 5\' 10"  (1.778 m)   Wt 155 lb (70.3 kg)   SpO2 (!) 0%   BMI 22.24 kg/m   Physical Exam  Constitutional: He is  oriented to person, place, and time.  Cardiovascular: Normal rate, regular rhythm and normal heart sounds.   Pulmonary/Chest: Effort normal and breath sounds normal.  Abdominal: Soft. There is no tenderness.  Musculoskeletal: Normal range of motion.  Neurological: He is alert and oriented to person, place, and time.  Skin: Skin is warm and dry.  Psychiatric: He has a normal mood and affect. His behavior is normal. Judgment and thought content normal.  Nursing note and vitals reviewed.   Mallampati Score:  MD Evaluation Airway: WNL Heart: WNL Abdomen:  WNL Chest/ Lungs: WNL ASA  Classification: 3 Mallampati/Airway Score: One  Imaging: No results found.  Labs:  CBC:  Recent Labs  03/22/16 1028 04/05/16 1053 06/07/16 1153  WBC 6.3 5.9 8.5  HGB 13.0* 13.5 15.0  HCT 39.2 38.8 42.8  PLT 290 306 300    COAGS: No results for input(s): INR, APTT in the last 8760 hours.  BMP:  Recent Labs  03/22/16 1028 04/05/16 1053  NA 136 139  K 5.0 4.7  CL 102  --   CO2 24 29  GLUCOSE 93 74  BUN 12 12.1  CALCIUM 9.5 9.5  CREATININE 0.91 0.9    LIVER FUNCTION TESTS:  Recent Labs  04/05/16 1053  BILITOT 0.61  AST 24  ALT 13  ALKPHOS 186*  PROT 7.3  ALBUMIN 4.0    TUMOR MARKERS: No results for input(s): AFPTM, CEA, CA199, CHROMGRNA in the last 8760 hours.  Assessment and Plan:  Liver lesion  Plan for hepatic adenoma surgery with Dr Barry Dienes Needs to confirm diagnosis prior to surgery Now scheduled for biopsy Risks and Benefits discussed with the patient including, but not limited to bleeding, infection, damage to adjacent structures or low yield requiring additional tests. All of the patient's questions were answered, patient is agreeable to proceed. Consent signed and in chart.   Thank you for this interesting consult.  I greatly enjoyed meeting William Nguyen and look forward to participating in their care.  A copy of this report was sent to the requesting provider on this date.  Electronically Signed: Monia Sabal A 06/07/2016, 12:42 PM   I spent a total of  30 Minutes   in face to face in clinical consultation, greater than 50% of which was counseling/coordinating care for liver lesion bx

## 2016-06-07 NOTE — Procedures (Signed)
US guided core biopsy of right hepatic lesion.  3 cores obtained.  No immediate complication.  Minimal bleeding.

## 2016-06-07 NOTE — Discharge Instructions (Addendum)
Liver Biopsy, Care After °Refer to this sheet in the next few weeks. These instructions provide you with information on caring for yourself after your procedure. Your health care provider may also give you more specific instructions. Your treatment has been planned according to current medical practices, but problems sometimes occur. Call your health care provider if you have any problems or questions after your procedure. °What can I expect after the procedure? °After your procedure, it is typical to have the following: °· A small amount of discomfort in the area where the biopsy was done and in the right shoulder or shoulder blade. °· A small amount of bruising around the area where the biopsy was done and on the skin over the liver. °· Sleepiness and fatigue for the rest of the day. °Follow these instructions at home: °· Rest at home for 1-2 days or as directed by your health care provider. °· Have a friend or family member stay with you for at least 24 hours. °· Because of the medicines used during the procedure, you should not do the following things in the first 24 hours: °¨ Drive. °¨ Use machinery. °¨ Be responsible for the care of other people. °¨ Sign legal documents. °¨ Take a bath or shower. °· There are many different ways to close and cover an incision, including stitches, skin glue, and adhesive strips. Follow your health care provider's instructions on: °¨ Incision care. °¨ Bandage (dressing) changes and removal. °¨ Incision closure removal. °· Do not drink alcohol in the first week. °· Do not lift more than 5 pounds or play contact sports for 2 weeks after this test. °· Take medicines only as directed by your health care provider. Do not take medicine containing aspirin or non-steroidal anti-inflammatory medicines such as ibuprofen for 1 week after this test. °· It is your responsibility to get your test results. °Contact a health care provider if: °· You have increased bleeding from an incision that  results in more than a small spot of blood. °· You have redness, swelling, or increasing pain in any incisions. °· You notice a discharge or a bad smell coming from any of your incisions. °· You have a fever or chills. °Get help right away if: °· You develop swelling, bloating, or pain in your abdomen. °· You become dizzy or faint. °· You develop a rash. °· You are nauseous or vomit. °· You have difficulty breathing, feel short of breath, or feel faint. °· You develop chest pain. °· You have problems with your speech or vision. °· You have trouble balancing or moving your arms or legs. °This information is not intended to replace advice given to you by your health care provider. Make sure you discuss any questions you have with your health care provider. °Document Released: 08/11/2004 Document Revised: 06/30/2015 Document Reviewed: 03/20/2013 °Elsevier Interactive Patient Education © 2017 [employer] ° ° ° ° °Moderate Conscious Sedation, Adult, Care After °These instructions provide you with information about caring for yourself after your procedure. Your health care provider may also give you more specific instructions. Your treatment has been planned according to current medical practices, but problems sometimes occur. Call your health care provider if you have any problems or questions after your procedure. °What can I expect after the procedure? °After your procedure, it is common: °· To feel sleepy for several hours. °· To feel clumsy and have poor balance for several hours. °· To have poor judgment for several hours. °· To vomit   if you eat too soon. °Follow these instructions at home: °For at least 24 hours after the procedure:  ° °· Do not: °¨ Participate in activities where you could fall or become injured. °¨ Drive. °¨ Use heavy machinery. °¨ Drink alcohol. °¨ Take sleeping pills or medicines that cause drowsiness. °¨ Make important decisions or sign legal documents. °¨ Take care of children on your  own. °· Rest. °Eating and drinking  °· Follow the diet recommended by your health care provider. °· If you vomit: °¨ Drink water, juice, or soup when you can drink without vomiting. °¨ Make sure you have little or no nausea before eating solid foods. °General instructions  °· Have a responsible adult stay with you until you are awake and alert. °· Take over-the-counter and prescription medicines only as told by your health care provider. °· If you smoke, do not smoke without supervision. °· Keep all follow-up visits as told by your health care provider. This is important. °Contact a health care provider if: °· You keep feeling nauseous or you keep vomiting. °· You feel light-headed. °· You develop a rash. °· You have a fever. °Get help right away if: °· You have trouble breathing. °This information is not intended to replace advice given to you by your health care provider. Make sure you discuss any questions you have with your health care provider. °Document Released: 11/12/2012 Document Revised: 06/27/2015 Document Reviewed: 05/14/2015 °Elsevier Interactive Patient Education © 2017 Elsevier Inc. ° °

## 2016-10-12 ENCOUNTER — Other Ambulatory Visit (HOSPITAL_COMMUNITY): Payer: Self-pay | Admitting: General Surgery

## 2016-10-12 DIAGNOSIS — R160 Hepatomegaly, not elsewhere classified: Secondary | ICD-10-CM

## 2016-11-07 ENCOUNTER — Telehealth: Payer: Self-pay

## 2016-11-07 NOTE — Telephone Encounter (Signed)
Pt called to to speak with the financial dept. please follow up

## 2017-06-10 ENCOUNTER — Telehealth: Payer: Self-pay

## 2017-06-10 NOTE — Telephone Encounter (Signed)
JA 

## 2017-07-19 ENCOUNTER — Ambulatory Visit: Payer: Self-pay | Admitting: Nurse Practitioner

## 2017-09-02 ENCOUNTER — Encounter: Payer: Self-pay | Admitting: Nurse Practitioner

## 2017-09-02 ENCOUNTER — Ambulatory Visit: Payer: Self-pay | Attending: Nurse Practitioner | Admitting: Nurse Practitioner

## 2017-09-02 VITALS — BP 125/84 | HR 100 | Temp 99.0°F | Ht 70.0 in | Wt 150.4 lb

## 2017-09-02 DIAGNOSIS — R160 Hepatomegaly, not elsewhere classified: Secondary | ICD-10-CM | POA: Insufficient documentation

## 2017-09-02 DIAGNOSIS — Z7689 Persons encountering health services in other specified circumstances: Secondary | ICD-10-CM | POA: Insufficient documentation

## 2017-09-02 DIAGNOSIS — Z8719 Personal history of other diseases of the digestive system: Secondary | ICD-10-CM | POA: Insufficient documentation

## 2017-09-02 DIAGNOSIS — K4090 Unilateral inguinal hernia, without obstruction or gangrene, not specified as recurrent: Secondary | ICD-10-CM | POA: Insufficient documentation

## 2017-09-02 NOTE — Progress Notes (Signed)
Assessment & Plan:  William Nguyen was seen today for establish care.  Diagnoses and all orders for this visit:  Non-recurrent unilateral inguinal hernia without obstruction or gangrene Continue to wear hernia support belt  Call 911 if you experience any sudden sharp or stabbing abdominal pain, continuous nausea or vomiting or sudden change in bowel habits.   Liver mass -     Basic metabolic panel -     CBC -     Lipid panel    Patient has been counseled on age-appropriate routine health concerns for screening and prevention. These are reviewed and up-to-date. Referrals have been placed accordingly. Immunizations are up-to-date or declined.    Subjective:   Chief Complaint  Patient presents with  . Establish Care    Pt. stated he have a hernia and it gives him pain.    HPI William Nguyen 47 y.o. male presents to office today to establish care. He has a history of right inguinal hernia for the past 15 years from lifting weights when he was younger, liver adenoma for which he has failed to follow up with general surgery due to financial limitations, and CAD with coronary artery calcification seen on CT scan.  PER ONCOLOGY NOTES FOR LIVER MASS on 04-13-2016: Liver adenoma  -I reviewed his repeated liver MRI scan findings with pt and his mother, which is most consistent with liver adenoma, this was presented in his previous MR scan in 2013, although size of his tumor has slightly increased. -He is a scan was previously reviewed in our GI tumor Board, and the consensus is likely benign. -Based on his scan findings, I did not recommend liver biopsy. -Due to size (>5cm) and intratumoral bleeding, I recommend him to see surgeon Dr. Barry Dienes to consider surgical resection, to prevent future tumor rupture and bleeding. - Discussed other options, including referral to interventional radiology for embolization or ablation, as well as close monitoring. -After lengthy discussion, patient agrees to see Dr.  Barry Dienes for consultation.. Liver adenoma  -I reviewed his repeated liver MRI scan findings with pt and his mother, which is most consistent with liver adenoma, this was presented in his previous MR scan in 2013, although size of his tumor has slightly increased. -He is a scan was previously reviewed in our GI tumor Board, and the consensus is likely benign. -Based on his scan findings, I did not recommend liver biopsy. -Due to size (>5cm) and intratumoral bleeding, I recommend him to see surgeon Dr. Barry Dienes to consider surgical resection, to prevent future tumor rupture and bleeding. - Discussed other options, including referral to interventional radiology for embolization or ablation, as well as close monitoring. -After lengthy discussion, patient agrees to see Dr. Barry Dienes for consultation.- I will refer to Dr. Barry Dienes for consultation of surgical resection of his hepatic adenoma  -I strongly encouraged him to establish his care with her primary care physician. I'll see him as needed in the future. Per Cardiology notes 05-14-2016: At this point he is not actively having anginal symptoms, so therefore we will plan for risk factor modification, and reevaluating the CPX next year. Depending on how that looks, we may need to proceed with more aggressive ischemic evaluation with Myoview nuclear stress test for for stress echo. Recommend initiating coenzyme Q 10 therapy which we can titrate up to 300 mg daily. If lipids warrant, would start statin after next visit - allowing 6 months of dietary modification and exercise.  He will need to be referred back to  specialities once he has been approved for the financial program.  Currently denies any nausea or vomitintg, abdominal pain or diarrhea. Constipation is intermittent. Eating plenty of fruits and vegetables.Main concern today is pain from left inguinal hernia which was an incidental finding on imaging however patient reports this hernia has been present for over  15 years.    Review of Systems  Constitutional: Negative for fever, malaise/fatigue and weight loss.  HENT: Negative.  Negative for nosebleeds.   Eyes: Negative.  Negative for blurred vision, double vision and photophobia.  Respiratory: Negative.  Negative for cough and shortness of breath.   Cardiovascular: Negative.  Negative for chest pain, palpitations and leg swelling.  Gastrointestinal: Positive for abdominal pain. Negative for heartburn, nausea and vomiting.  Genitourinary: Negative.   Musculoskeletal: Negative.  Negative for myalgias.  Neurological: Negative.  Negative for dizziness, focal weakness, seizures and headaches.  Psychiatric/Behavioral: Negative.  Negative for suicidal ideas.    Past Medical History:  Diagnosis Date  . Ulcer    gastric ulcers    Past Surgical History:  Procedure Laterality Date  . CardioPulmonary Exercise Test: CPX  04/2016   Exercise testing with gas exchange shows a moderate functional limitation due to a circulatory limitation -- Peak VO2 53%.  There were no ST-T wave changes with exercise. Resting spirometry suggests mild restriction. Hypertensive response to exercise.    Family History  Problem Relation Age of Onset  . Healthy Mother   . Healthy Father   . Healthy Sister   . Healthy Brother   . Heart disease Paternal Grandmother        Unsure of details - still living at age    Social History Reviewed with no changes to be made today.   Outpatient Medications Prior to Visit  Medication Sig Dispense Refill  . Coenzyme Q10 (COQ10) 100 MG CAPS Take 300 mg by mouth daily. 30 each 11   No facility-administered medications prior to visit.     No Known Allergies     Objective:    BP 125/84 (BP Location: Right Arm, Patient Position: Sitting, Cuff Size: Normal)   Pulse 100   Temp 99 F (37.2 C) (Oral)   Ht 5\' 10"  (1.778 m)   Wt 150 lb 6.4 oz (68.2 kg)   SpO2 96%   BMI 21.58 kg/m  Wt Readings from Last 3 Encounters:    09/02/17 150 lb 6.4 oz (68.2 kg)  06/07/16 155 lb (70.3 kg)  05/14/16 144 lb 3.2 oz (65.4 kg)    Physical Exam  Constitutional: He is oriented to person, place, and time. He appears well-developed and well-nourished. He is cooperative.  HENT:  Head: Normocephalic and atraumatic.  Eyes: Conjunctivae and EOM are normal. Right conjunctiva is not injected. Right conjunctiva has no hemorrhage. Left conjunctiva is not injected. Left conjunctiva has no hemorrhage. No scleral icterus.  Neck: Normal range of motion.  Cardiovascular: Normal rate, regular rhythm, normal heart sounds and intact distal pulses. Exam reveals no gallop and no friction rub.  No murmur heard. Pulmonary/Chest: Effort normal and breath sounds normal. No tachypnea. No respiratory distress. He has no decreased breath sounds. He has no wheezes. He has no rhonchi. He has no rales. He exhibits no tenderness.  Abdominal: Soft. Bowel sounds are normal. He exhibits no distension. There is tenderness in the suprapubic area. There is no rigidity, no rebound, no guarding, no CVA tenderness, no tenderness at McBurney's point and negative Murphy's sign. A hernia is present. Hernia confirmed  positive in the left inguinal area.    He has on a hernia support belt  Musculoskeletal: Normal range of motion. He exhibits no edema.  Neurological: He is alert and oriented to person, place, and time. Coordination normal.  Skin: Skin is warm and dry.  Psychiatric: He has a normal mood and affect. His behavior is normal. Judgment and thought content normal.  Nursing note and vitals reviewed.        Patient has been counseled extensively about nutrition and exercise as well as the importance of adherence with medications and regular follow-up. The patient was given clear instructions to go to ER or return to medical center if symptoms don't improve, worsen or new problems develop. The patient verbalized understanding.   Follow-up: Return in about  8 weeks (around 10/28/2017) for Physical;.   Gildardo Pounds, FNP-BC [facility] and [facility] [place], [place]   09/02/2017, 5:58 PM

## 2017-09-02 NOTE — Patient Instructions (Addendum)
Please contact me through myChart once you have been approved for the [employer].  You will need referrals to: ENT, Cardiology, General Surgery You will need studies: MRI on liver and [place] of groin

## 2017-09-03 LAB — CBC
HEMATOCRIT: 40.6 % (ref 37.5–51.0)
Hemoglobin: 14.1 g/dL (ref 13.0–17.7)
MCH: 34.1 pg — ABNORMAL HIGH (ref 26.6–33.0)
MCHC: 34.7 g/dL (ref 31.5–35.7)
MCV: 98 fL — AB (ref 79–97)
PLATELETS: 332 10*3/uL (ref 150–450)
RBC: 4.14 x10E6/uL (ref 4.14–5.80)
RDW: 14.7 % (ref 12.3–15.4)
WBC: 5.6 10*3/uL (ref 3.4–10.8)

## 2017-09-03 LAB — LIPID PANEL
CHOL/HDL RATIO: 3.5 ratio (ref 0.0–5.0)
Cholesterol, Total: 177 mg/dL (ref 100–199)
HDL: 50 mg/dL (ref >39)
LDL Calculated: 85 mg/dL (ref 0–99)
Triglycerides: 208 mg/dL — ABNORMAL HIGH (ref 0–149)
VLDL Cholesterol Cal: 42 mg/dL — ABNORMAL HIGH (ref 5–40)

## 2017-09-03 LAB — BASIC METABOLIC PANEL
BUN / CREAT RATIO: 9 (ref 9–20)
BUN: 8 mg/dL (ref 6–24)
CO2: 24 mmol/L (ref 20–29)
CREATININE: 0.94 mg/dL (ref 0.76–1.27)
Calcium: 9.6 mg/dL (ref 8.7–10.2)
Chloride: 95 mmol/L — ABNORMAL LOW (ref 96–106)
GFR calc non Af Amer: 98 mL/min/{1.73_m2} (ref >59)
GFR, EST AFRICAN AMERICAN: 113 mL/min/{1.73_m2} (ref >59)
Glucose: 88 mg/dL (ref 65–99)
Potassium: 5 mmol/L (ref 3.5–5.2)
SODIUM: 137 mmol/L (ref 134–144)

## 2017-09-06 ENCOUNTER — Ambulatory Visit: Payer: No Typology Code available for payment source | Attending: Nurse Practitioner

## 2017-09-11 ENCOUNTER — Ambulatory Visit: Payer: Self-pay | Attending: Family Medicine

## 2017-09-14 ENCOUNTER — Emergency Department (HOSPITAL_COMMUNITY)
Admission: EM | Admit: 2017-09-14 | Discharge: 2017-09-15 | Disposition: A | Discharge status: Home / Self Care | Payer: Self-pay | Attending: Emergency Medicine | Admitting: Emergency Medicine

## 2017-09-14 ENCOUNTER — Encounter (HOSPITAL_COMMUNITY): Payer: Self-pay | Admitting: Emergency Medicine

## 2017-09-14 ENCOUNTER — Emergency Department (HOSPITAL_COMMUNITY): Payer: Self-pay

## 2017-09-14 DIAGNOSIS — I2510 Atherosclerotic heart disease of native coronary artery without angina pectoris: Secondary | ICD-10-CM | POA: Insufficient documentation

## 2017-09-14 DIAGNOSIS — Z23 Encounter for immunization: Secondary | ICD-10-CM | POA: Insufficient documentation

## 2017-09-14 DIAGNOSIS — Y998 Other external cause status: Secondary | ICD-10-CM | POA: Insufficient documentation

## 2017-09-14 DIAGNOSIS — F17210 Nicotine dependence, cigarettes, uncomplicated: Secondary | ICD-10-CM | POA: Insufficient documentation

## 2017-09-14 DIAGNOSIS — Y92008 Other place in unspecified non-institutional (private) residence as the place of occurrence of the external cause: Secondary | ICD-10-CM | POA: Insufficient documentation

## 2017-09-14 DIAGNOSIS — S61214A Laceration without foreign body of right ring finger without damage to nail, initial encounter: Secondary | ICD-10-CM | POA: Insufficient documentation

## 2017-09-14 DIAGNOSIS — F10920 Alcohol use, unspecified with intoxication, uncomplicated: Secondary | ICD-10-CM

## 2017-09-14 DIAGNOSIS — S0990XA Unspecified injury of head, initial encounter: Secondary | ICD-10-CM

## 2017-09-14 DIAGNOSIS — S61210A Laceration without foreign body of right index finger without damage to nail, initial encounter: Secondary | ICD-10-CM | POA: Insufficient documentation

## 2017-09-14 DIAGNOSIS — F10220 Alcohol dependence with intoxication, uncomplicated: Secondary | ICD-10-CM | POA: Insufficient documentation

## 2017-09-14 DIAGNOSIS — S0181XA Laceration without foreign body of other part of head, initial encounter: Secondary | ICD-10-CM

## 2017-09-14 DIAGNOSIS — S01511A Laceration without foreign body of lip, initial encounter: Secondary | ICD-10-CM | POA: Insufficient documentation

## 2017-09-14 DIAGNOSIS — Y939 Activity, unspecified: Secondary | ICD-10-CM | POA: Insufficient documentation

## 2017-09-14 DIAGNOSIS — W108XXA Fall (on) (from) other stairs and steps, initial encounter: Secondary | ICD-10-CM | POA: Insufficient documentation

## 2017-09-14 MED ORDER — LIDOCAINE-EPINEPHRINE (PF) 2 %-1:200000 IJ SOLN
20.0000 mL | Freq: Once | INTRAMUSCULAR | Status: AC
  Administered 2017-09-14: 20 mL
  Filled 2017-09-14: qty 20

## 2017-09-14 MED ORDER — TETANUS-DIPHTH-ACELL PERTUSSIS 5-2.5-18.5 LF-MCG/0.5 IM SUSP
0.5000 mL | Freq: Once | INTRAMUSCULAR | Status: AC
  Administered 2017-09-14: 0.5 mL via INTRAMUSCULAR
  Filled 2017-09-14: qty 0.5

## 2017-09-14 NOTE — ED Notes (Signed)
Bed: FX25 Expected date:  Expected time:  Means of arrival:  Comments: 47 yo M/Fall-ETOH

## 2017-09-14 NOTE — ED Triage Notes (Signed)
While intoxicated, the patient fell from the top step of his trailer face first and hit the ground. He lacerated his nose, upper lip an right pointer finger.

## 2017-09-15 ENCOUNTER — Encounter (HOSPITAL_COMMUNITY): Payer: Self-pay | Admitting: Radiology

## 2017-09-15 ENCOUNTER — Emergency Department (HOSPITAL_COMMUNITY): Payer: Self-pay

## 2017-09-15 MED ORDER — LIDOCAINE-EPINEPHRINE (PF) 2 %-1:200000 IJ SOLN
20.0000 mL | Freq: Once | INTRAMUSCULAR | Status: AC
  Administered 2017-09-15: 20 mL
  Filled 2017-09-15: qty 20

## 2017-09-15 MED ORDER — CEPHALEXIN 500 MG PO CAPS
500.0000 mg | ORAL_CAPSULE | Freq: Once | ORAL | Status: AC
  Administered 2017-09-15: 500 mg via ORAL
  Filled 2017-09-15: qty 1

## 2017-09-15 MED ORDER — CEPHALEXIN 500 MG PO CAPS: 500.0000 mg | ORAL_CAPSULE | Freq: Three times a day (TID) | ORAL | 0 refills | Status: DC

## 2017-09-15 MED ORDER — CHLORHEXIDINE GLUCONATE 0.12 % MT SOLN: 15.0000 mL | Freq: Two times a day (BID) | OROMUCOSAL | 0 refills | Status: DC

## 2017-09-15 NOTE — ED Provider Notes (Signed)
A prescription for Keflex was NOT given to the patient.  I have sent and E-Prescription to the [facility] on [address].  I attempted to contact the patient with this information but reached his voicemail  A message was left on his voicemail with this information   Jola Schmidt, MD 09/15/17 [phone]
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
## 2017-09-15 NOTE — Discharge Instructions (Addendum)
Sutures in your right hand will need to be removed in 10 days  Buddy tape your right little finger to your right ring finger for 3 weeks (change the tape daily)  Sutures in your under your nose will dissolve on their own  Rinse your mouth as instructed with peridex

## 2017-09-16 MED FILL — CHLORHEXIDINE 0.12% RINSE: 0.12 | 15 days supply | Qty: 473 | Fill #0

## 2017-09-16 MED FILL — CEPHALEXIN 500 MG CAPSULE: 500 | 7 days supply | Qty: 21 | Fill #0
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
## 2017-09-26 ENCOUNTER — Ambulatory Visit: Payer: Self-pay | Attending: Nurse Practitioner | Admitting: Physician Assistant

## 2017-09-26 VITALS — BP 140/79 | HR 93 | Temp 98.8°F | Resp 18 | Ht 70.0 in | Wt 148.0 lb

## 2017-09-26 DIAGNOSIS — Z8711 Personal history of peptic ulcer disease: Secondary | ICD-10-CM | POA: Insufficient documentation

## 2017-09-26 DIAGNOSIS — Z4802 Encounter for removal of sutures: Secondary | ICD-10-CM

## 2017-09-26 DIAGNOSIS — Z09 Encounter for follow-up examination after completed treatment for conditions other than malignant neoplasm: Secondary | ICD-10-CM

## 2017-09-26 DIAGNOSIS — M25512 Pain in left shoulder: Secondary | ICD-10-CM

## 2017-09-26 MED ORDER — NAPROXEN 500 MG PO TABS: 500.0000 mg | ORAL_TABLET | Freq: Two times a day (BID) | ORAL | 0 refills | Status: DC

## 2017-10-14 ENCOUNTER — Encounter: Payer: Self-pay | Admitting: Nurse Practitioner

## 2017-10-14 ENCOUNTER — Encounter: Payer: Self-pay | Admitting: *Deleted

## 2017-10-14 ENCOUNTER — Ambulatory Visit: Payer: Self-pay | Attending: Nurse Practitioner | Admitting: Nurse Practitioner

## 2017-10-14 VITALS — BP 136/87 | HR 84 | Temp 99.4°F | Ht 70.0 in | Wt 145.4 lb

## 2017-10-14 DIAGNOSIS — R220 Localized swelling, mass and lump, head: Secondary | ICD-10-CM | POA: Insufficient documentation

## 2017-10-14 DIAGNOSIS — K4090 Unilateral inguinal hernia, without obstruction or gangrene, not specified as recurrent: Secondary | ICD-10-CM | POA: Insufficient documentation

## 2017-10-14 DIAGNOSIS — R221 Localized swelling, mass and lump, neck: Secondary | ICD-10-CM

## 2017-10-14 DIAGNOSIS — Z0000 Encounter for general adult medical examination without abnormal findings: Secondary | ICD-10-CM | POA: Insufficient documentation

## 2017-10-14 DIAGNOSIS — Z23 Encounter for immunization: Secondary | ICD-10-CM | POA: Insufficient documentation

## 2017-10-14 NOTE — Progress Notes (Signed)
Assessment & Plan:  William Nguyen was seen today for annual exam.  Diagnoses and all orders for this visit:  Encounter for routine history and physical exam for male  Left inguinal hernia -     Ambulatory referral to General Surgery  Submandibular swelling -     US Soft Tissue Head/Neck; Future  Need for immunization against influenza -     Flu Vaccine QUAD 36+ mos IM    Patient has been counseled on age-appropriate routine health concerns for screening and prevention. These are reviewed and up-to-date. Referrals have been placed accordingly. Immunizations are up-to-date or declined.    Subjective:   Chief Complaint  Patient presents with  . Annual Exam    Pt. is here for a physical.    HPI William Nguyen 47 y.o. male presents to office today for annual physical.     Review of Systems  Constitutional: Negative for fever, malaise/fatigue and weight loss.  HENT: Negative.  Negative for nosebleeds.   Eyes: Negative.  Negative for blurred vision, double vision and photophobia.  Respiratory: Negative.  Negative for cough and shortness of breath.   Cardiovascular: Negative.  Negative for chest pain, palpitations and leg swelling.  Gastrointestinal: Positive for abdominal pain. Negative for heartburn, nausea and vomiting.  Genitourinary: Negative.   Musculoskeletal: Negative.  Negative for myalgias.  Skin: Negative.   Neurological: Negative.  Negative for dizziness, focal weakness, seizures and headaches.  Endo/Heme/Allergies: Negative.   Psychiatric/Behavioral: Negative.  Negative for suicidal ideas.    Past Medical History:  Diagnosis Date  . Ulcer    gastric ulcers    Past Surgical History:  Procedure Laterality Date  . CardioPulmonary Exercise Test: CPX  04/2016   Exercise testing with gas exchange shows a moderate functional limitation due to a circulatory limitation -- Peak VO2 53%.  There were no ST-T wave changes with exercise. Resting spirometry suggests mild  restriction. Hypertensive response to exercise.    Family History  Problem Relation Age of Onset  . Healthy Mother   . Healthy Father   . Healthy Sister   . Healthy Brother   . Heart disease Paternal Grandmother        Unsure of details - still living at age    Social History Reviewed with no changes to be made today.   Outpatient Medications Prior to Visit  Medication Sig Dispense Refill  . chlorhexidine (PERIDEX) 0.12 % solution Use as directed 15 mLs in the mouth or throat 2 (two) times daily. 120 mL 0  . cephALEXin (KEFLEX) 500 MG capsule Take 1 capsule (500 mg total) by mouth 3 (three) times daily. (Patient not taking: Reported on 10/14/2017) 21 capsule 0  . naproxen (NAPROSYN) 500 MG tablet Take 1 tablet (500 mg total) by mouth 2 (two) times daily with a meal. X 1 week then prn pain (Patient not taking: Reported on 10/14/2017) 30 tablet 0   No facility-administered medications prior to visit.     No Known Allergies     Objective:    BP 136/87 (BP Location: Left Arm, Patient Position: Sitting, Cuff Size: Normal)   Pulse 84   Temp 99.4 F (37.4 C) (Oral)   Ht 5\' 10"  (1.778 m)   Wt 145 lb 6.4 oz (66 kg)   SpO2 96%   BMI 20.86 kg/m  Wt Readings from Last 3 Encounters:  10/14/17 145 lb 6.4 oz (66 kg)  09/26/17 148 lb (67.1 kg)  09/02/17 150 lb 6.4 oz (68.2 kg)  Physical Exam  Constitutional: He is oriented to person, place, and time. He appears well-developed and well-nourished.  HENT:  Head: Normocephalic and atraumatic.    Right Ear: Hearing, tympanic membrane, external ear and ear canal normal.  Left Ear: Hearing, tympanic membrane, external ear and ear canal normal.  Nose: Nose normal. No mucosal edema or rhinorrhea.  Mouth/Throat: Uvula is midline, oropharynx is clear and moist and mucous membranes are normal. Abnormal dentition. Dental caries present. Tonsils are 1+ on the right. Tonsils are 1+ on the left. No tonsillar exudate.    Eyes: Pupils are equal,  round, and reactive to light. Conjunctivae, EOM and lids are normal. No scleral icterus.  Neck: Normal range of motion. Neck supple. No tracheal deviation present. No thyromegaly present.  Cardiovascular: Normal rate, regular rhythm, normal heart sounds and intact distal pulses. Exam reveals no gallop and no friction rub.  No murmur heard. Pulses:      Dorsalis pedis pulses are 2+ on the right side, and 2+ on the left side.       Posterior tibial pulses are 2+ on the right side, and 2+ on the left side.  Pulmonary/Chest: Effort normal and breath sounds normal. No respiratory distress. He has no wheezes. He has no rales. He exhibits no mass and no tenderness. Right breast exhibits no inverted nipple, no mass, no nipple discharge, no skin change and no tenderness. Left breast exhibits no inverted nipple, no mass, no nipple discharge, no skin change and no tenderness.  Abdominal: Soft. Bowel sounds are normal. He exhibits no distension and no mass. There is hepatomegaly. There is no hepatosplenomegaly or splenomegaly. There is tenderness in the suprapubic area. There is no rebound and no guarding. A hernia is present. Hernia confirmed positive in the left inguinal area. Hernia confirmed negative in the right inguinal area.  Genitourinary: Testes normal and penis normal. Right testis shows no mass, no swelling and no tenderness. Right testis is descended. Cremasteric reflex is not absent on the right side. Left testis shows no mass, no swelling and no tenderness. Left testis is descended. Cremasteric reflex is not absent on the left side.  Musculoskeletal: Normal range of motion. He exhibits no edema, tenderness or deformity.  Feet:  Right Foot:  Skin Integrity: Negative for skin breakdown.  Left Foot:  Skin Integrity: Negative for skin breakdown.  Lymphadenopathy:    He has no cervical adenopathy. No inguinal adenopathy noted on the right or left side.       Right: No inguinal adenopathy present.        Left: No inguinal adenopathy present.  Neurological: He is alert and oriented to person, place, and time. He has normal strength. He displays normal reflexes. No cranial nerve deficit or sensory deficit. He exhibits normal muscle tone. He displays a negative Romberg sign. Coordination and gait normal.  Skin: Skin is warm and dry. Capillary refill takes less than 2 seconds. No erythema.  Psychiatric: He has a normal mood and affect. His behavior is normal. Judgment and thought content normal.         Patient has been counseled extensively about nutrition and exercise as well as the importance of adherence with medications and regular follow-up. The patient was given clear instructions to go to ER or return to medical center if symptoms don't improve, worsen or new problems develop. The patient verbalized understanding.   Follow-up: Return if symptoms worsen or fail to improve.   Gildardo Pounds, FNP-BC [facility]  and [place], [place]   10/15/2017, 10:31 PM

## 2017-10-15 ENCOUNTER — Encounter: Payer: Self-pay | Admitting: Nurse Practitioner

## 2017-10-18 ENCOUNTER — Ambulatory Visit (HOSPITAL_COMMUNITY)
Admission: RE | Admit: 2017-10-18 | Discharge: 2017-10-18 | Disposition: A | Discharge status: Home / Self Care | Payer: Self-pay | Source: Ambulatory Visit | Attending: Nurse Practitioner | Admitting: Nurse Practitioner

## 2017-10-18 ENCOUNTER — Encounter: Payer: Self-pay | Admitting: Surgery

## 2017-10-18 ENCOUNTER — Ambulatory Visit (INDEPENDENT_AMBULATORY_CARE_PROVIDER_SITE_OTHER): Payer: Self-pay | Admitting: Surgery

## 2017-10-18 VITALS — BP 155/96 | HR 83 | Temp 97.7°F | Ht 68.0 in | Wt 144.0 lb

## 2017-10-18 DIAGNOSIS — R220 Localized swelling, mass and lump, head: Secondary | ICD-10-CM

## 2017-10-18 DIAGNOSIS — R221 Localized swelling, mass and lump, neck: Secondary | ICD-10-CM | POA: Insufficient documentation

## 2017-10-18 DIAGNOSIS — F1010 Alcohol abuse, uncomplicated: Secondary | ICD-10-CM

## 2017-10-18 DIAGNOSIS — K4090 Unilateral inguinal hernia, without obstruction or gangrene, not specified as recurrent: Secondary | ICD-10-CM

## 2017-10-18 HISTORY — DX: Alcohol abuse, uncomplicated: F10.10

## 2017-10-18 NOTE — Progress Notes (Signed)
10/18/2017  Reason for Visit:  Left inguinal hernia  Referring Provider:  Geryl Rankins, NP  History of Present Illness: William Nguyen is a 47 y.o. male referred for evaluation of a left inguinal hernia.  The patient reports that he's had this hernia for about 15 years, and it's become more symptomatic over the past year.  He reports that the hernia is reducible.  At the beginning of the day, the hernia is reduced when he wakes up but as the day progresses, the hernia bulges and he starts getting more pain.  Sometimes the pain is worse than others, and when it's worse, he has had episodes of nausea and emesis, though not commonly.  He denies any fevers, chills, chest pain, or shortness of breath.  He at times is constipated and takes medication to help but he does not strain hard often.  Denies any blood in the stool.  He feels the hernia is large and there is intestine in it.    Of note, he smokes 1 pack per day and drinks a 6-pack of beer per day.  Denies any withdrawal issues.  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  Patient reports that he saw Dr. Barry Dienes and the plan was for watchful waiting.  However, he has not had any repeat imaging since 04/2016, when the mass was noted to be 4.5 x 5.7 x 5.5 cm.  There is a CT scan ordered 10/12/16 but has not been done.  Past Medical History: Past Medical History:  Diagnosis Date  . Alcohol abuse 10/18/2017   drinks a 6 pack daily  . Fracture of angle of right mandible, initial encounter for closed fracture ([place])   . 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 Reducible left inguinal hernia   . Smoking   . Ulcer    gastric ulcers with bleeding episode     Past Surgical History: Past Surgical History:  Procedure Laterality Date  . CardioPulmonary Exercise Test: CPX  04/2016   Exercise testing with gas exchange shows a moderate functional limitation due to a  circulatory limitation -- Peak VO2 53%.  There were no ST-T wave changes with exercise. Resting spirometry suggests mild restriction. Hypertensive response to exercise.  . hand surgery Right     Home Medications: Prior to Admission medications   Medication Sig Start Date End Date Taking? Authorizing Provider  co-enzyme Q-10 30 MG capsule Take 30 mg by mouth 3 (three) times daily.   Yes [provider]    Allergies: No Known Allergies  Social History:  reports that he has been smoking cigarettes. He has a 25.00 pack-year smoking history. He has never used smokeless tobacco. He reports that he drinks about 6.0 standard drinks of alcohol per week. He reports that he does not use drugs.   Family History: Family History  Problem Relation Age of Onset  . Healthy Mother   . Healthy Father   . Healthy Sister   . Healthy Brother   . Heart disease Paternal Grandmother        Unsure of details - still living at age    Review of Systems: Review of Systems  Constitutional: Negative for chills and fever.  HENT: Negative for hearing loss.   Eyes: Negative for blurred vision.  Respiratory: Negative for shortness of breath.   Cardiovascular: Negative for chest pain.  Gastrointestinal: Positive for abdominal  pain. Negative for blood in stool, constipation, diarrhea, nausea and vomiting.  Genitourinary: Negative for dysuria.  Musculoskeletal: Negative for myalgias.  Skin: Negative for rash.  Neurological: Negative for dizziness.  Psychiatric/Behavioral: Negative for depression.    Physical Exam BP (!) 155/96   Pulse 83   Temp 97.7 F (36.5 C) (Skin)   Ht 5\' 8"  (1.727 m)   Wt 144 lb (65.3 kg)   BMI 21.90 kg/m  CONSTITUTIONAL: No acute distress HEENT:  Normocephalic, atraumatic, extraocular motion intact. NECK: Trachea is midline, and there is no jugular venous distension.  RESPIRATORY:  Lungs are clear, and breath sounds are equal bilaterally. Normal respiratory effort  without pathologic use of accessory muscles. CARDIOVASCULAR: Heart is regular without murmurs, gallops, or rubs. GI: The abdomen is soft, non-distended, non-tender to palpation.  Patient has a moderate size left inguinal hernia, easily reducible, with likely intestinal contents as can feel peristalsis. Hernia extends to left scrotum. MUSCULOSKELETAL:  Normal muscle strength and tone in all four extremities.  No peripheral edema or cyanosis. SKIN: Skin turgor is normal. There are no pathologic skin lesions.  NEUROLOGIC:  Motor and sensation is grossly normal.  Cranial nerves are grossly intact. PSYCH:  Alert and oriented to person, place and time. Affect is normal.  Laboratory Analysis: Labs from 09/02/17: Na 137, K 5.0, Cl 95, CO2 24, BUN 8, Cr 0.94.  Cholesterol 177, LDL 85, HDL 50, Trigs 208.  WBC 5.6, Hgb 14.1, Hct 40.6, Plt 332.  Imaging: MRI abdomen 04/06/16: IMPRESSION: 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  While hepatic adenomas are statistically less common in this patient population, the imaging characteristics do not support the diagnosis of FNH.   Assessment and Plan: This is a 47 y.o. male who presents with a symptomatic left inguinal hernia.  I have independently viewed the patient's imaging studies and reviewed his laboratory studies.  Overall he has a reducible inguinal hernia and a 5.7 cm mass in right liver lobe.  Discussed with the patient that given the size and possible complications, it is recommended that he continue to follow up with Dr. Barry Dienes.  It has been more than a year since his last imaging study and this mass may have grown larger since.  He would be at risk of hemorrage into the mass and/or rupture.  From the inguinal hernia standpoint, discussed with the patient that we can offer him repair with mesh.  Given his smoking and drinking though, I have asked him to take the next  month to cut down on his smoking and drinking so that he can decrease his risk of possible post-surgical complications, particularly with mesh placement for the hernia repair.  He is likely not well nourished either.  He understands this and will cut down on both so he can have a more successful outcome for surgery.  He will follow up next month with me and we will discuss surgery timing further.  Discussed with him that if we do proceed with surgery, likely this would be an open left inguinal hernia repair with mesh, particularly as this is a large hernia and has been present for 15 years.  He understands this and is in full agreement.  He declined any prescription for Nicotine patch.  Face-to-face time spent with the patient and care providers was 60 minutes, with more than 50% of the time spent counseling, educating, and coordinating care of the patient.  Melvyn Neth, [facility]

## 2017-10-18 NOTE — Patient Instructions (Addendum)
Please try to stop smoking and drinking beer or any type of alcohol to decrease the chances of any infection.   Please follow up with the surgeon for your liver.   Inguinal Hernia, Adult An inguinal hernia is when fat or the intestines push through the area where the leg meets the lower belly (groin) and make a rounded lump (bulge). This condition happens over time. There are three types of inguinal hernias. These types include:  Hernias that can be pushed back into the belly (are reducible).  Hernias that cannot be pushed back into the belly (are incarcerated).  Hernias that cannot be pushed back into the belly and lose their blood supply (get strangulated). This type needs emergency surgery.  Follow these instructions at home: Lifestyle  Drink enough fluid to keep your urine (pee) clear or pale yellow.  Eat plenty of fruits, vegetables, and whole grains. These have a lot of fiber. Talk with your doctor if you have questions.  Avoid lifting heavy objects.  Avoid standing for long periods of time.  Do not use tobacco products. These include cigarettes, chewing tobacco, or e-cigarettes. If you need help quitting, ask your doctor.  Try to stay at a healthy weight. General instructions  Do not try to force the hernia back in.  Watch your hernia for any changes in color or size. Let your doctor know if there are any changes.  Take over-the-counter and prescription medicines only as told by your doctor.  Keep all follow-up visits as told by your doctor. This is important. Contact a doctor if:  You have a fever.  You have new symptoms.  Your symptoms get worse. Get help right away if:  The area where the legs meets the lower belly has: ? Pain that gets worse suddenly. ? A bulge that gets bigger suddenly and does not go down. ? A bulge that turns red or purple. ? A bulge that is painful to the touch.  You are a man and your scrotum: ? Suddenly feels painful. ? Suddenly  changes in size.  You feel sick to your stomach (nauseous) and this feeling does not go away.  You throw up (vomit) and this keeps happening.  You feel your heart beating a lot more quickly than normal.  You cannot poop (have a bowel movement) or pass gas. This information is not intended to replace advice given to you by your health care provider. Make sure you discuss any questions you have with your health care provider. Document Released: 02/22/2006 Document Revised: 06/30/2015 Document Reviewed: 12/02/2013 Elsevier Interactive Patient Education  2018 [employer].

## 2017-11-20 ENCOUNTER — Other Ambulatory Visit: Payer: Self-pay

## 2017-11-20 ENCOUNTER — Ambulatory Visit (INDEPENDENT_AMBULATORY_CARE_PROVIDER_SITE_OTHER): Payer: Self-pay | Admitting: Surgery

## 2017-11-20 ENCOUNTER — Encounter: Payer: Self-pay | Admitting: Surgery

## 2017-11-20 VITALS — BP 137/83 | HR 89 | Temp 97.5°F | Resp 12 | Ht 70.0 in | Wt 145.0 lb

## 2017-11-20 DIAGNOSIS — K4090 Unilateral inguinal hernia, without obstruction or gangrene, not specified as recurrent: Secondary | ICD-10-CM

## 2017-11-20 NOTE — Progress Notes (Signed)
11/20/2017  History of Present Illness: William Nguyen is a 47 y.o. male with a reducible left inguinal hernia.  He was seen in the office about a month ago and given his issues with alcohol abuse and smoking, we encouraged the patient to cut down on both.  Now he reports that instead of a six-pack of beer per day, he only drinks a few beers on the weekends, and he has cut down from 1 pack of cigarettes per day to 5 per day.  He also has a history of a liver mass that had been enlarging and he was lost to follow up with Dr. Barry Dienes.  He has not seen her since our last visit appointment.  He does report that the left hernia is still symptomatic and is having more frequent discomfort.  The hernia remains reducible and he is using a hernia truss to help.  Past Medical History: Past Medical History:  Diagnosis Date  . Alcohol abuse 10/18/2017   drinks a 6 pack daily  . Fracture of angle of right mandible, initial encounter for closed fracture ([place])   . 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 Reducible left inguinal hernia   . Smoking   . Ulcer    gastric ulcers with bleeding episode     Past Surgical History: Past Surgical History:  Procedure Laterality Date  . CardioPulmonary Exercise Test: CPX  04/2016   Exercise testing with gas exchange shows a moderate functional limitation due to a circulatory limitation -- Peak VO2 53%.  There were no ST-T wave changes with exercise. Resting spirometry suggests mild restriction. Hypertensive response to exercise.  . hand surgery Right     Home Medications: Prior to Admission medications   Medication Sig Start Date End Date Taking? Authorizing Provider  co-enzyme Q-10 30 MG capsule Take 30 mg by mouth 3 (three) times daily.   Yes [provider]    Allergies: No Known Allergies  Review of Systems: Review of Systems  Constitutional: Negative for chills and fever.  Respiratory: Negative for shortness of breath.    Cardiovascular: Negative for chest pain.  Gastrointestinal: Negative for abdominal pain, nausea and vomiting.    Physical Exam BP 137/83   Pulse 89   Temp (!) 97.5 F (36.4 C) (Skin)   Resp 12   Ht 5\' 10"  (1.778 m)   Wt 145 lb (65.8 kg)   SpO2 96%   BMI 20.81 kg/m  CONSTITUTIONAL: No acute distress HEENT:  Normocephalic, atraumatic, extraocular motion intact. RESPIRATORY:  Lungs are clear, and breath sounds are equal bilaterally. Normal respiratory effort without pathologic use of accessory muscles. CARDIOVASCULAR: Heart is regular without murmurs, gallops, or rubs. GI: The abdomen is soft, nondistended, nontender.  He has a reducible left inguinal hernia, moderate size, extending to the left scrotum.  NEUROLOGIC:  Motor and sensation is grossly normal.  Cranial nerves are grossly intact. PSYCH:  Alert and oriented to person, place and time. Affect is normal.  Labs/Imaging: None recent  Assessment and Plan: This is a 47 y.o. male with a reducible left inguinal hernia.  Discussed with the patient that given the liver mass he has and the risk of rupture with it enlarging, I would still want him to follow up with Dr. Barry Dienes.  There is an older CT scan order from her which was never done as he did not follow up with her.  His last imaging study is from 04/06/16, at which  point the mass had enlarged from 4.9 cm to 5.7 cm.  Once he has follow up with her, if she does not need any procedures or tests, then we can proceed with left inguinal hernia repair.  In the meantime, I did commend the patient for decreasing his drinking and smoking and encouraged him to continue doing so, as this will only help him with his overall health, and with post-op healing.  He will follow up in a month to assess his progress.  The patient is aware that if his symptoms from the hernia do worsen or if there is any issue with incarceration, that we would have to proceed sooner with surgery.  Return precautions  given.  Face-to-face time spent with the patient and care providers was 25 minutes, with more than 50% of the time spent counseling, educating, and coordinating care of the patient.     Melvyn Neth, [facility]

## 2017-11-20 NOTE — Patient Instructions (Addendum)
The patient is aware to call back for any questions or new concerns. Continue to reduce smoking and reduce his alcohol intake prior to surgery . Discussed with the patient that given the size and possible complications, it is recommended that he continue to follow up with Dr. Stark Klein (right liver lobe mass), prior to surgery.   Inguinal Hernia, Adult An inguinal hernia is when fat or the intestines push through the area where the leg meets the lower belly (groin) and make a rounded lump (bulge). This condition happens over time. There are three types of inguinal hernias. These types include:  Hernias that can be pushed back into the belly (are reducible).  Hernias that cannot be pushed back into the belly (are incarcerated).  Hernias that cannot be pushed back into the belly and lose their blood supply (get strangulated). This type needs emergency surgery.  Follow these instructions at home: Lifestyle  Drink enough fluid to keep your urine (pee) clear or pale yellow.  Eat plenty of fruits, vegetables, and whole grains. These have a lot of fiber. Talk with your doctor if you have questions.  Avoid lifting heavy objects.  Avoid standing for long periods of time.  Do not use tobacco products. These include cigarettes, chewing tobacco, or e-cigarettes. If you need help quitting, ask your doctor.  Try to stay at a healthy weight. General instructions  Do not try to force the hernia back in.  Watch your hernia for any changes in color or size. Let your doctor know if there are any changes.  Take over-the-counter and prescription medicines only as told by your doctor.  Keep all follow-up visits as told by your doctor. This is important. Contact a doctor if:  You have a fever.  You have new symptoms.  Your symptoms get worse. Get help right away if:  The area where the legs meets the lower belly has: ? Pain that gets worse suddenly. ? A bulge that gets bigger suddenly  and does not go down. ? A bulge that turns red or purple. ? A bulge that is painful to the touch.  You are a man and your scrotum: ? Suddenly feels painful. ? Suddenly changes in size.  You feel sick to your stomach (nauseous) and this feeling does not go away.  You throw up (vomit) and this keeps happening.  You feel your heart beating a lot more quickly than normal.  You cannot poop (have a bowel movement) or pass gas. This information is not intended to replace advice given to you by your health care provider. Make sure you discuss any questions you have with your health care provider. Document Released: 02/22/2006 Document Revised: 06/30/2015 Document Reviewed: 12/02/2013 Elsevier Interactive Patient Education  2018 [employer].

## 2017-12-11 ENCOUNTER — Encounter

## 2017-12-11 ENCOUNTER — Ambulatory Visit: Payer: Self-pay | Attending: Nurse Practitioner | Admitting: Nurse Practitioner

## 2017-12-11 ENCOUNTER — Encounter: Payer: Self-pay | Admitting: Nurse Practitioner

## 2017-12-11 VITALS — BP 138/93 | HR 83 | Temp 99.0°F | Ht 70.0 in | Wt 145.0 lb

## 2017-12-11 DIAGNOSIS — R160 Hepatomegaly, not elsewhere classified: Secondary | ICD-10-CM | POA: Insufficient documentation

## 2017-12-11 DIAGNOSIS — K4090 Unilateral inguinal hernia, without obstruction or gangrene, not specified as recurrent: Secondary | ICD-10-CM | POA: Insufficient documentation

## 2017-12-11 DIAGNOSIS — K769 Liver disease, unspecified: Secondary | ICD-10-CM | POA: Insufficient documentation

## 2017-12-11 DIAGNOSIS — K0889 Other specified disorders of teeth and supporting structures: Secondary | ICD-10-CM | POA: Insufficient documentation

## 2017-12-11 DIAGNOSIS — K089 Disorder of teeth and supporting structures, unspecified: Secondary | ICD-10-CM

## 2017-12-11 DIAGNOSIS — G8929 Other chronic pain: Secondary | ICD-10-CM | POA: Insufficient documentation

## 2017-12-11 DIAGNOSIS — M79672 Pain in left foot: Secondary | ICD-10-CM | POA: Insufficient documentation

## 2017-12-11 DIAGNOSIS — Z8249 Family history of ischemic heart disease and other diseases of the circulatory system: Secondary | ICD-10-CM | POA: Insufficient documentation

## 2017-12-11 DIAGNOSIS — Z79899 Other long term (current) drug therapy: Secondary | ICD-10-CM | POA: Insufficient documentation

## 2017-12-11 DIAGNOSIS — M79671 Pain in right foot: Secondary | ICD-10-CM | POA: Insufficient documentation

## 2017-12-11 DIAGNOSIS — F17200 Nicotine dependence, unspecified, uncomplicated: Secondary | ICD-10-CM | POA: Insufficient documentation

## 2017-12-11 DIAGNOSIS — R030 Elevated blood-pressure reading, without diagnosis of hypertension: Secondary | ICD-10-CM | POA: Insufficient documentation

## 2017-12-11 MED ORDER — NAPROXEN 500 MG PO TABS: 500.0000 mg | ORAL_TABLET | Freq: Two times a day (BID) | ORAL | 0 refills | Status: AC | PRN

## 2017-12-11 MED ORDER — AMLODIPINE BESYLATE 5 MG PO TABS: 5.0000 mg | ORAL_TABLET | Freq: Every day | ORAL | 3 refills | Status: DC

## 2017-12-11 MED ORDER — NAPROXEN 500 MG PO TABS: 500.0000 mg | ORAL_TABLET | Freq: Two times a day (BID) | ORAL | 2 refills | Status: DC

## 2017-12-11 MED FILL — AMLODIPINE BESYLATE 5 MG TA: 5 | 30 days supply | Qty: 30 | Fill #0

## 2017-12-11 MED FILL — NAPROXEN 500 MG TABLET: 500 | 30 days supply | Qty: 60 | Fill #0

## 2017-12-11 NOTE — Progress Notes (Signed)
Assessment & Plan:  [name] was seen today for referral.  Diagnoses and all orders for this visit:  Left inguinal hernia -     Ambulatory referral to General Surgery  Liver mass -     Ambulatory referral to Gastroenterology  Bilateral foot pain -     Uric Acid -     naproxen (NAPROSYN) 500 MG tablet; Take 1 tablet (500 mg total) by mouth 2 (two) times daily with a meal.  Liver disease -     CT Abdomen Pelvis Wo Contrast; Future  Poor dentition -     Ambulatory referral to Dentistry     Patient has been counseled on age-appropriate routine health concerns for screening and prevention. These are reviewed and up-to-date. Referrals have been placed accordingly. Immunizations are up-to-date or declined.    Subjective:   Chief Complaint  Patient presents with  . Referral    Pt. is requesting referral for [place] surgery for hernia repair.    HPI William Nguyen 47 y.o. male presents to office today requesting a referral to general surgery for evaluation of his left inguinal hernia. He is requesting to be referred to [facility]. I have instructed him that he may not be able to have his surgery performed through [facility] based on his current financial assistance plan.  There are notes from Dr. Hampton Abbot General Surgery with [facility] from 10-18-2017 and 11-20-2017 with latest plan to follow up later this month for progress and for patient to be evaluated by GI for liver mass which has been increasing in size. I will order a repeat CT to evaluate the size prior to his appointment with GI. His last imaging study is from 04/06/16, at which point the mass had enlarged from 4.9 cm to 5.7 cm. Per Dr. Hampton Abbot if liver mass does not require surgical intervention then he will proceed with left inguinal hernia repair.    Elevated Blood Pressure Recommended dietary changes/DASH and will follow up with patient in  6-8 weeks for blood pressure recheck. Blood pressure has not been  consistently high so I would like to see if he can reduce it without medications at this time. Denies chest pain, shortness of breath, palpitations, lightheadedness, dizziness, headaches or BLE edema. He continues to smoke as well. I have recommended smoking cessation.  BP Readings from Last 3 Encounters:  12/11/17 (!) 138/93  11/20/17 137/83  10/18/17 (!) 155/96   Bilateral Foot Pain Chronic. He does have a history of alcohol abuse and continues to smoke. Will check for Gout. Recommended sparing use of naproxen. He verbalized understanding. Pain is intermittent with no aggravating factors. Location of pain is the metatarsal area and sometimes the toes of both feet. Pain goes away on its own.    Review of Systems  Constitutional: Negative for fever, malaise/fatigue and weight loss.  HENT: Negative.  Negative for nosebleeds.   Eyes: Negative.  Negative for blurred vision, double vision and photophobia.  Respiratory: Negative.  Negative for cough and shortness of breath.   Cardiovascular: Negative.  Negative for chest pain, palpitations and leg swelling.  Gastrointestinal: Negative.  Negative for heartburn, nausea and vomiting.  Genitourinary:       Inguinal hernia  Musculoskeletal: Positive for joint pain. Negative for myalgias.  Neurological: Negative.  Negative for dizziness, focal weakness, seizures and headaches.  Psychiatric/Behavioral: Negative.  Negative for suicidal ideas.    Past Medical History:  Diagnosis Date  . Alcohol abuse 10/18/2017   drinks a  6 pack daily  . Fracture of angle of right mandible, initial encounter for closed fracture ([place])   . 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 Reducible left inguinal hernia   . Smoking   . Ulcer    gastric ulcers with bleeding episode    Past Surgical History:  Procedure Laterality Date  . CardioPulmonary Exercise Test: CPX  04/2016   Exercise testing with gas exchange shows a moderate functional  limitation due to a circulatory limitation -- Peak VO2 53%.  There were no ST-T wave changes with exercise. Resting spirometry suggests mild restriction. Hypertensive response to exercise.  . hand surgery Right     Family History  Problem Relation Age of Onset  . Healthy Mother   . Healthy Father   . Healthy Sister   . Healthy Brother   . Heart disease Paternal Grandmother        Unsure of details - still living at age    Social History Reviewed with no changes to be made today.   Outpatient Medications Prior to Visit  Medication Sig Dispense Refill  . co-enzyme Q-10 30 MG capsule Take 30 mg by mouth 3 (three) times daily.     No facility-administered medications prior to visit.     No Known Allergies     Objective:    BP (!) 138/93 (BP Location: Left Arm, Patient Position: Sitting, Cuff Size: Normal)   Pulse 83   Temp 99 F (37.2 C) (Oral)   Ht 5\' 10"  (1.778 m)   Wt 145 lb (65.8 kg)   SpO2 97%   BMI 20.81 kg/m  Wt Readings from Last 3 Encounters:  12/11/17 145 lb (65.8 kg)  11/20/17 145 lb (65.8 kg)  10/18/17 144 lb (65.3 kg)    Physical Exam  Constitutional: He is oriented to person, place, and time. He appears well-developed and well-nourished. He is cooperative.  HENT:  Head: Normocephalic and atraumatic.  Eyes: EOM are normal.  Neck: Normal range of motion.  Cardiovascular: Normal rate, regular rhythm and normal heart sounds. Exam reveals no gallop and no friction rub.  No murmur heard. Pulses:      Dorsalis pedis pulses are 2+ on the right side, and 2+ on the left side.       Posterior tibial pulses are 2+ on the right side, and 2+ on the left side.  Pulmonary/Chest: Effort normal and breath sounds normal. No tachypnea. No respiratory distress. He has no decreased breath sounds. He has no wheezes. He has no rhonchi. He has no rales. He exhibits no tenderness.  Abdominal: Soft. Normal appearance and bowel sounds are normal. There is tenderness in the right  upper quadrant.  Musculoskeletal: Normal range of motion. He exhibits no edema, tenderness or deformity.       Right foot: There is normal range of motion.       Left foot: There is normal range of motion.  Feet:  Right Foot:  Skin Integrity: Negative for skin breakdown or erythema.  Left Foot:  Skin Integrity: Negative for skin breakdown or erythema.  Neurological: He is alert and oriented to person, place, and time. Coordination normal.  Skin: Skin is warm and dry.  Psychiatric: He has a normal mood and affect. His behavior is normal. Judgment and thought content normal.  Nursing note and vitals reviewed.       Patient has been counseled extensively about nutrition and exercise as well as the importance  of adherence with medications and regular follow-up. The patient was given clear instructions to go to ER or return to medical center if symptoms don't improve, worsen or new problems develop. The patient verbalized understanding.   Follow-up: Return in about 6 weeks (around 01/22/2018) for BP recheck.   Gildardo Pounds, FNP-BC [facility] and [place], [place]   12/11/2017, 6:25 PM

## 2017-12-11 NOTE — Patient Instructions (Signed)
Inguinal Hernia, Adult An inguinal hernia is when fat or the intestines push through the area where the leg meets the lower belly (groin) and make a rounded lump (bulge). This condition happens over time. There are three types of inguinal hernias. These types include:  Hernias that can be pushed back into the belly (are reducible).  Hernias that cannot be pushed back into the belly (are incarcerated).  Hernias that cannot be pushed back into the belly and lose their blood supply (get strangulated). This type needs emergency surgery.  Follow these instructions at home: Lifestyle  Drink enough fluid to keep your urine (pee) clear or pale yellow.  Eat plenty of fruits, vegetables, and whole grains. These have a lot of fiber. Talk with your doctor if you have questions.  Avoid lifting heavy objects.  Avoid standing for long periods of time.  Do not use tobacco products. These include cigarettes, chewing tobacco, or e-cigarettes. If you need help quitting, ask your doctor.  Try to stay at a healthy weight. General instructions  Do not try to force the hernia back in.  Watch your hernia for any changes in color or size. Let your doctor know if there are any changes.  Take over-the-counter and prescription medicines only as told by your doctor.  Keep all follow-up visits as told by your doctor. This is important. Contact a doctor if:  You have a fever.  You have new symptoms.  Your symptoms get worse. Get help right away if:  The area where the legs meets the lower belly has: ? Pain that gets worse suddenly. ? A bulge that gets bigger suddenly and does not go down. ? A bulge that turns red or purple. ? A bulge that is painful to the touch.  You are a man and your scrotum: ? Suddenly feels painful. ? Suddenly changes in size.  You feel sick to your stomach (nauseous) and this feeling does not go away.  You throw up (vomit) and this keeps happening.  You feel your heart  beating a lot more quickly than normal.  You cannot poop (have a bowel movement) or pass gas. This information is not intended to replace advice given to you by your health care provider. Make sure you discuss any questions you have with your health care provider. Document Released: 02/22/2006 Document Revised: 06/30/2015 Document Reviewed: 12/02/2013 Elsevier Interactive Patient Education  2018 Elsevier Inc.  

## 2017-12-12 ENCOUNTER — Telehealth: Payer: Self-pay | Admitting: Nurse Practitioner

## 2017-12-12 ENCOUNTER — Ambulatory Visit: Payer: Self-pay | Admitting: Gastroenterology

## 2017-12-12 LAB — URIC ACID: URIC ACID: 8.6 mg/dL (ref 3.7–8.6)

## 2017-12-12 NOTE — Telephone Encounter (Signed)
Alycia can you please call [facility] [phone] back so I can speak to the Physician who called? Thanks

## 2017-12-12 NOTE — Telephone Encounter (Addendum)
Physician called to speak with ms.fleming however, because ms.fleming is not here he would like to speak with the attending provider however, the physician hung up before being able to be transferred or given a response. Due to the physician calling from an anonymous number they can not be reached.  Reviewed patients appointments and see that they do have an appointment with Dr.Wohl today Nov. 7th. Please follow up.

## 2017-12-13 NOTE — Telephone Encounter (Signed)
[facility] was called and I was informed that they were calling to inform PCP of appointment date change.

## 2017-12-16 ENCOUNTER — Other Ambulatory Visit: Payer: Self-pay | Admitting: Nurse Practitioner

## 2017-12-16 MED ORDER — ALLOPURINOL 100 MG PO TABS: 100.0000 mg | ORAL_TABLET | Freq: Every day | ORAL | 6 refills | Status: DC

## 2017-12-18 ENCOUNTER — Ambulatory Visit: Payer: Self-pay | Admitting: Surgery

## 2017-12-20 ENCOUNTER — Ambulatory Visit (HOSPITAL_COMMUNITY)
Admission: RE | Admit: 2017-12-20 | Discharge: 2017-12-20 | Disposition: A | Discharge status: Home / Self Care | Payer: Self-pay | Source: Ambulatory Visit | Attending: Nurse Practitioner | Admitting: Nurse Practitioner

## 2017-12-20 DIAGNOSIS — K4090 Unilateral inguinal hernia, without obstruction or gangrene, not specified as recurrent: Secondary | ICD-10-CM | POA: Insufficient documentation

## 2017-12-20 DIAGNOSIS — R160 Hepatomegaly, not elsewhere classified: Secondary | ICD-10-CM | POA: Insufficient documentation

## 2017-12-20 DIAGNOSIS — N200 Calculus of kidney: Secondary | ICD-10-CM | POA: Insufficient documentation

## 2017-12-25 ENCOUNTER — Ambulatory Visit: Payer: Self-pay | Admitting: Surgery

## 2018-01-13 IMAGING — CT CT CHEST W/O CM
2 of 3 series · 15 of 36 positions shown, 18 images · non-contrast
Comparison: 03/26/2016

CLINICAL DATA: Followup multilobar pneumonia.  Smoker.

EXAM:
CT CHEST WITHOUT CONTRAST
TECHNIQUE: Multidetector CT imaging of the chest was performed following the
standard protocol without IV contrast.

[Series 2: thorax · axial · 0.68mm/px · z∈[-279,-25]mm · 12 of 151 slices shown, 15 images]
[im 12/151  mediastinal]
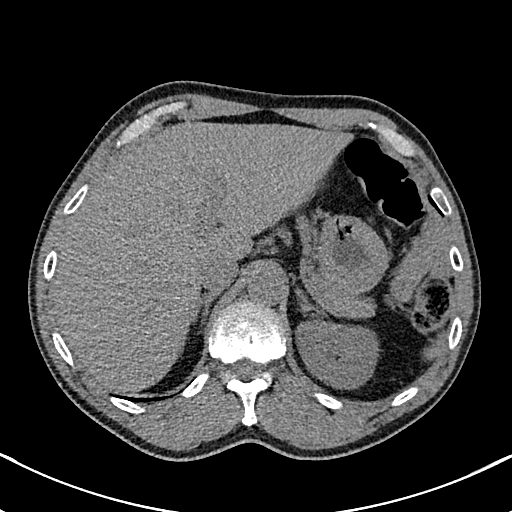
[im 12/151  lung]
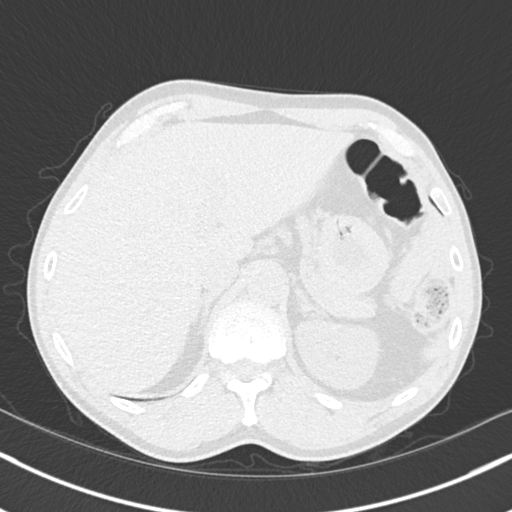
[im 23/151  lung]
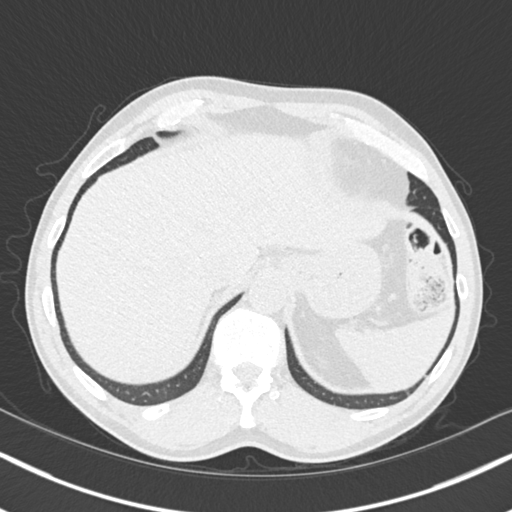
[im 34/151  lung]
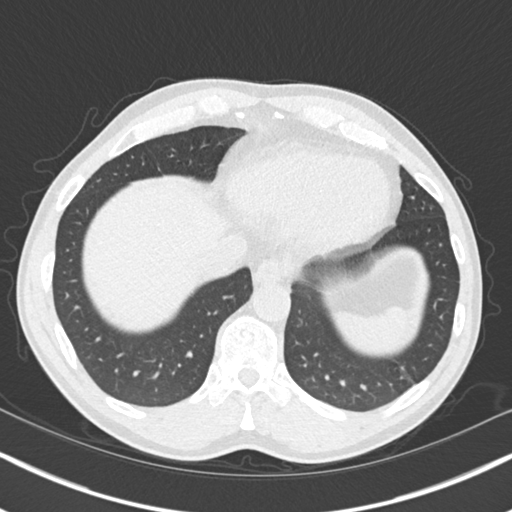
[im 45/151  lung]
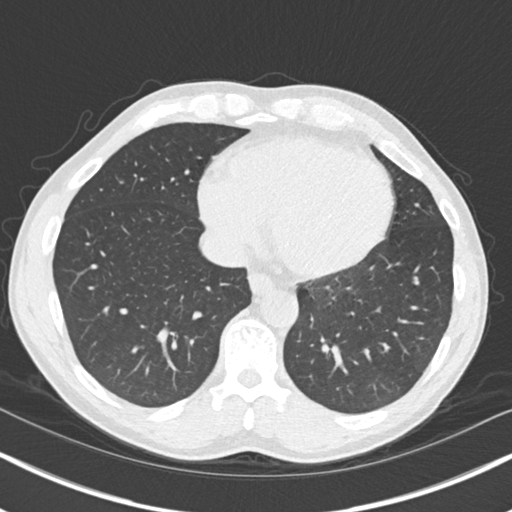
[im 56/151  mediastinal]
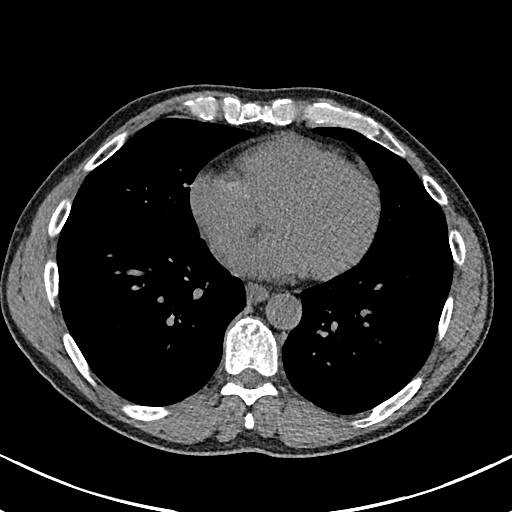
[im 56/151  lung]
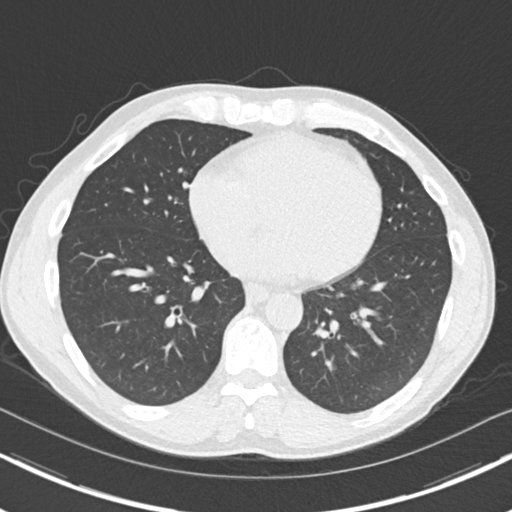
[im 67/151  lung]
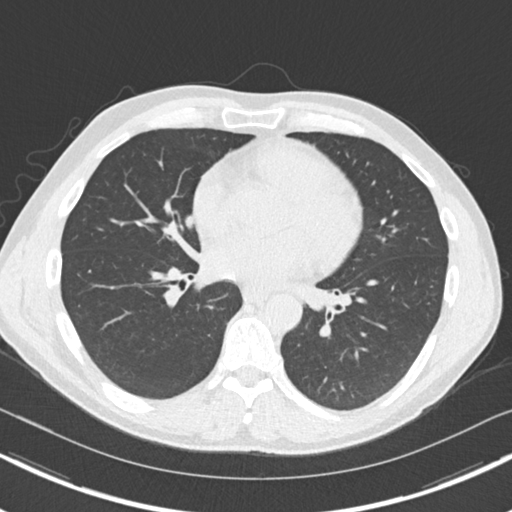
[im 84/151  lung]
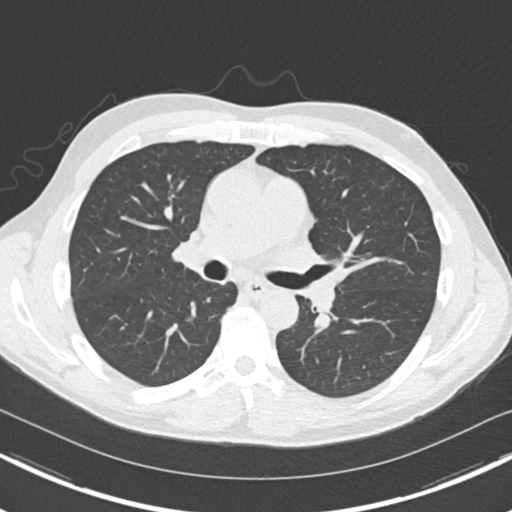
[im 95/151  lung]
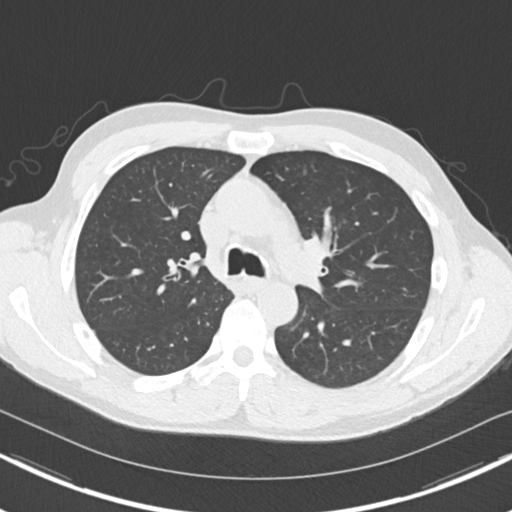
[im 106/151  mediastinal]
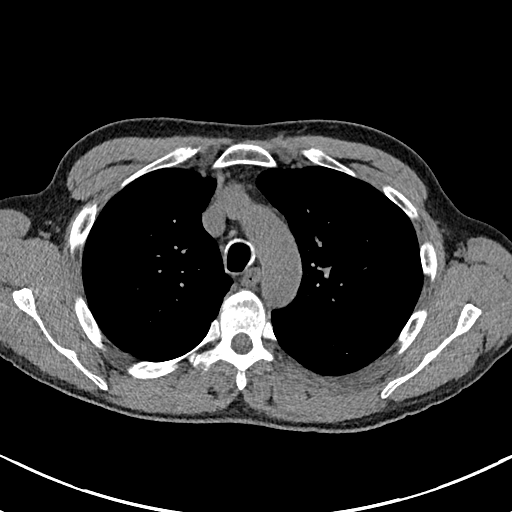
[im 106/151  lung]
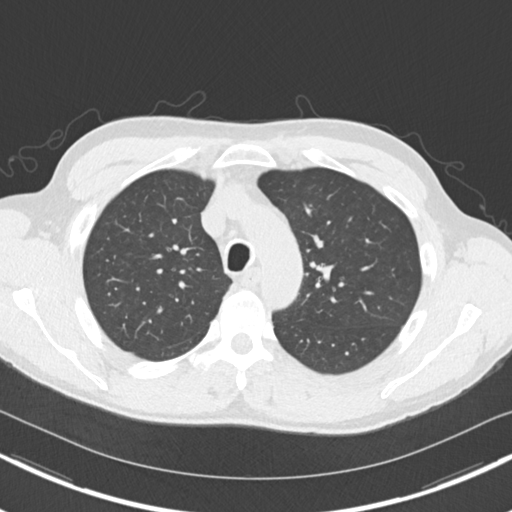
[im 117/151  lung]
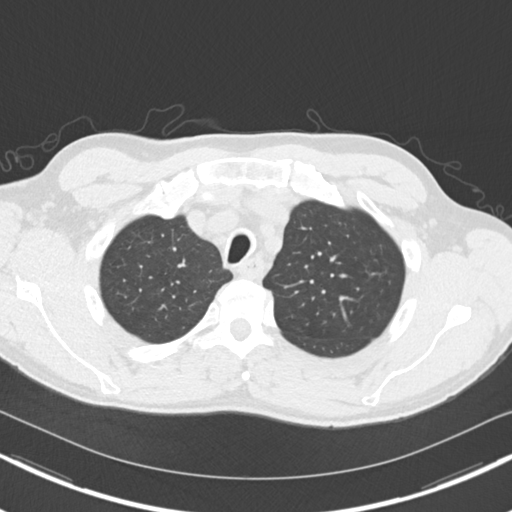
[im 128/151  lung]
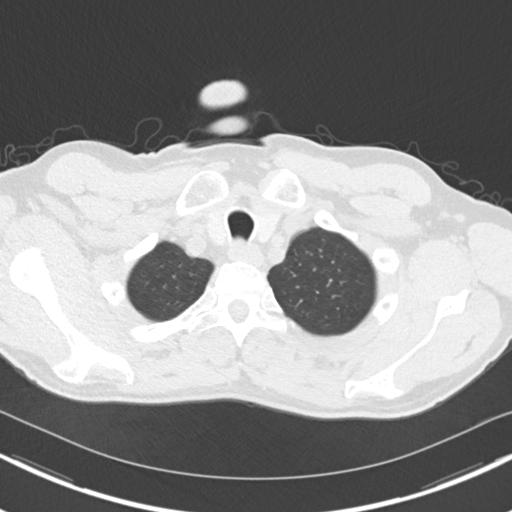
[im 139/151  lung]
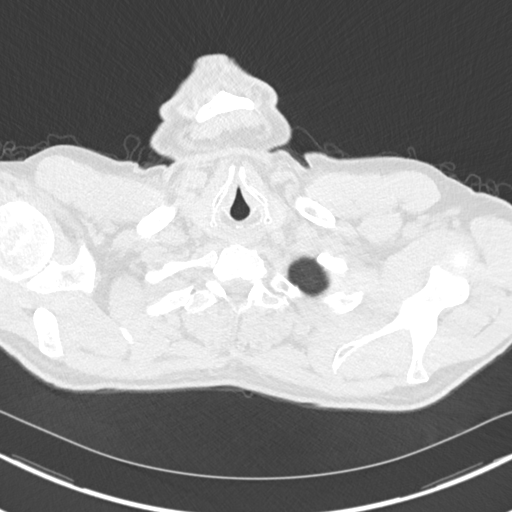

[Series 6: coronal · coronal · 0.62mm/px · 3 of 143 slices shown]
[im 29/143  lung]
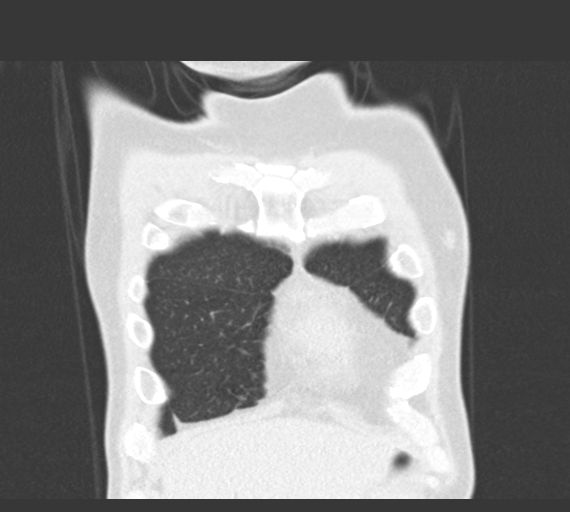
[im 57/143  lung]
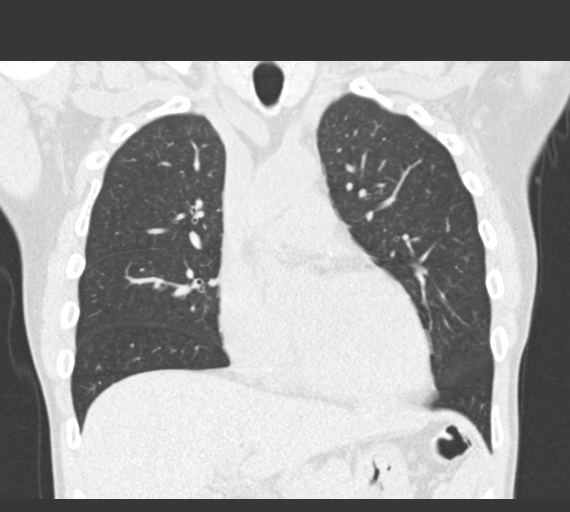
[im 86/143  lung]
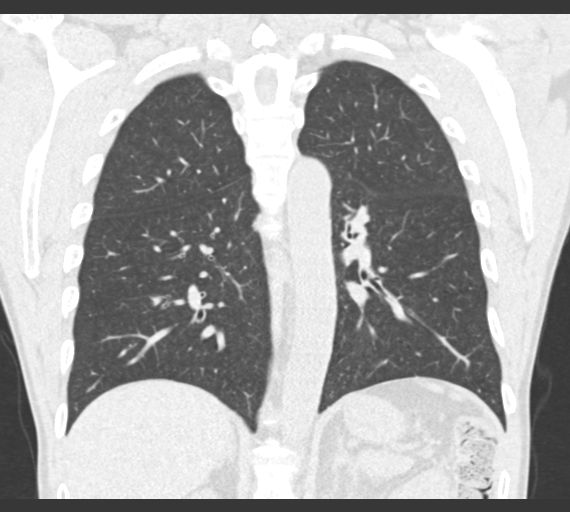

[15 of 36 positions shown; findings below may reference images not displayed]

FINDINGS: Cardiovascular: No acute findings. LAD coronary artery calcification
noted.

Mediastinum/Nodes: No masses or pathologically enlarged lymph nodes
identified on this unenhanced exam.

Lungs/Pleura: No pulmonary infiltrate or mass identified. No
effusion present.

Upper Abdomen:  Unremarkable.

Musculoskeletal:  No suspicious bone lesions.
IMPRESSION: Negative.  No evidence of pneumonia or other active disease.

## 2018-01-15 ENCOUNTER — Ambulatory Visit (INDEPENDENT_AMBULATORY_CARE_PROVIDER_SITE_OTHER): Payer: Self-pay | Admitting: Gastroenterology

## 2018-01-15 ENCOUNTER — Other Ambulatory Visit
Admission: RE | Admit: 2018-01-15 | Discharge: 2018-01-15 | Disposition: A | Discharge status: Home / Self Care | Payer: Self-pay | Attending: Gastroenterology | Admitting: Gastroenterology

## 2018-01-15 ENCOUNTER — Encounter: Payer: Self-pay | Admitting: Gastroenterology

## 2018-01-15 VITALS — BP 145/90 | HR 83 | Ht 70.0 in | Wt 149.2 lb

## 2018-01-15 DIAGNOSIS — D134 Benign neoplasm of liver: Secondary | ICD-10-CM | POA: Insufficient documentation

## 2018-01-15 LAB — HEPATIC FUNCTION PANEL
ALBUMIN: 4.4 g/dL (ref 3.5–5.0)
ALT: 17 U/L (ref 0–44)
AST: 30 U/L (ref 15–41)
Alkaline Phosphatase: 266 U/L — ABNORMAL HIGH (ref 38–126)
BILIRUBIN DIRECT: 0.1 mg/dL (ref 0.0–0.2)
BILIRUBIN INDIRECT: 0.9 mg/dL (ref 0.3–0.9)
TOTAL PROTEIN: 7.6 g/dL (ref 6.5–8.1)
Total Bilirubin: 1 mg/dL (ref 0.3–1.2)

## 2018-01-15 NOTE — Progress Notes (Signed)
Gastroenterology Consultation  Referring Provider:     Gildardo Pounds, NP Primary Care Physician:  Gildardo Pounds, NP Primary Gastroenterologist:  Dr. Allen Norris     Reason for Consultation:     Liver lesion        HPI:   NASSIM COSMA is a 47 y.o. y/o male referred for consultation & management of liver lesion by Dr. Raul Del, Vernia Buff, NP.  This patient comes in today with a history of having a liver lesion and was evaluated by surgery for a inguinal hernia.  The patient was recommended to get clearance prior to having the inguinal hernia repair.  The patient has a history dating back to 2013 with a lesion in his liver that has been investigated with multiple CT scans and MRIs in the past for size.  It appears that the patient's lesion has remained somewhat stable during this interval.  The patient denies any abdominal pain nausea vomiting fevers or chills.  He does report having worsening constipation due to his inability to strain due to his hernia.  The patient's adenoma on imaging has been over 5 cm on multiple occasions.  Past Medical History:  Diagnosis Date  . Alcohol abuse 10/18/2017   drinks a 6 pack daily  . Fracture of angle of right mandible, initial encounter for closed fracture ([place])   . 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 Reducible left inguinal hernia   . Smoking   . Ulcer    gastric ulcers with bleeding episode    Past Surgical History:  Procedure Laterality Date  . CardioPulmonary Exercise Test: CPX  04/2016   Exercise testing with gas exchange shows a moderate functional limitation due to a circulatory limitation -- Peak VO2 53%.  There were no ST-T wave changes with exercise. Resting spirometry suggests mild restriction. Hypertensive response to exercise.  . hand surgery Right     Prior to Admission medications   Medication Sig Start Date End Date Taking? Authorizing Provider  allopurinol (ZYLOPRIM) 100 MG tablet Take 1 tablet (100 mg  total) by mouth daily. 12/16/17  Yes Gildardo Pounds, NP  co-enzyme Q-10 30 MG capsule Take 30 mg by mouth 3 (three) times daily.   Yes [provider]  naproxen (NAPROSYN) 500 MG tablet Take 500 mg by mouth 2 (two) times daily with a meal.   Yes [provider]  amLODipine (NORVASC) 5 MG tablet  12/11/17   [provider]    Family History  Problem Relation Age of Onset  . Healthy Mother   . Healthy Father   . Healthy Sister   . Healthy Brother   . Heart disease Paternal Grandmother        Unsure of details - still living at age     Social History   Tobacco Use  . Smoking status: Current Every Day Smoker    Packs/day: 0.25    Years: 25.00    Pack years: 6.25    Types: Cigarettes  . Smokeless tobacco: Never Used  Substance Use Topics  . Alcohol use: Yes    Alcohol/week: 2.0 standard drinks    Types: 2 Cans of beer per week    Comment: heavier in past, for the past 20 years, but now a few on weekends  . Drug use: No    Allergies as of 01/15/2018  . (No Known Allergies)    Review of Systems:    All  systems reviewed and negative except where noted in HPI.   Physical Exam:  BP (!) 145/90   Pulse 83   Ht 5\' 10"  (1.778 m)   Wt 149 lb 3.2 oz (67.7 kg)   BMI 21.41 kg/m  No LMP for male patient. General:   Alert,  Well-developed, well-nourished, pleasant and cooperative in NAD Head:  Normocephalic and atraumatic. Eyes:  Sclera clear, no icterus.   Conjunctiva pink. Ears:  Normal auditory acuity. Nose:  No deformity, discharge, or lesions. Mouth:  No deformity or lesions,oropharynx pink & moist. Neck:  Supple; no masses or thyromegaly. Lungs:  Respirations even and unlabored.  Clear throughout to auscultation.   No wheezes, crackles, or rhonchi. No acute distress. Heart:  Regular rate and rhythm; no murmurs, clicks, rubs, or gallops. Abdomen:  Normal bowel sounds.  No bruits.  Soft, non-tender and non-distended without masses,  hepatosplenomegaly or hernias noted.  No guarding or rebound tenderness.  Negative Carnett sign.   Rectal:  Deferred.  Msk:  Symmetrical without gross deformities.  Good, equal movement & strength bilaterally. Pulses:  Normal pulses noted. Extremities:  No clubbing or edema.  No cyanosis. Neurologic:  Alert and oriented x3;  grossly normal neurologically. Skin:  Intact without significant lesions or rashes.  No jaundice. Lymph Nodes:  No significant cervical adenopathy. Psych:  Alert and cooperative. Normal mood and affect.  Imaging Studies: Ct Abdomen Pelvis Wo Contrast  Result Date: 12/22/2017 CLINICAL DATA:  Known liver mass.  Upcoming hernia surgery. EXAM: CT ABDOMEN AND PELVIS WITHOUT CONTRAST TECHNIQUE: Multidetector CT imaging of the abdomen and pelvis was performed following the standard protocol without IV contrast. COMPARISON:  CT and MRI scan since July 30, 2011 FINDINGS: Lower chest: No acute abnormality. Hepatobiliary: The patient's known mass in the right hepatic lobe is more difficult to measure today due to lack of contrast. However, the mass appears to measure 6.7 by 4.7 by 6.4 cm today versus 6.9 by 5.1 by 6.5 cm in January of 2018. The MRI from April 06, 2016 suggested a possible adenoma. The mass is not further characterized today. No identified hemorrhage. No other hepatic masses. The gallbladder is poorly distended but unremarkable. The portal vein is not well assessed. Pancreas: Unremarkable. No pancreatic ductal dilatation or surrounding inflammatory changes. Spleen: Normal in size without focal abnormality. Adrenals/Urinary Tract: The adrenal glands are normal. There is a 4 mm stone in the upper right kidney. No other renal stones. No hydronephrosis or perinephric stranding. No ureterectasis or ureteral stones. Mild prominence of the anterior bladder wall may be due to poor distention. Stomach/Bowel: The stomach and small bowel are normal. The distal descending colon extends  into a left inguinal hernia, extending into the scrotum. No evidence of inflammation or obstruction. Moderate fecal loading in the colon. The appendix is normal. Vascular/Lymphatic: No significant vascular findings are present. No enlarged abdominal or pelvic lymph nodes. Reproductive: Prostate is unremarkable. Other: No abdominal wall hernia or abnormality. No abdominopelvic ascites. Musculoskeletal: No acute or significant osseous findings. IMPRESSION: 1. The known hepatic mass is similar in size in the interval, thought to most likely represent an adenoma based on the April 06, 2016 MRI. 2. Nonobstructive 4 mm stone in the right kidney. 3. The distal descending colon extends into a left inguinal hernia, extending into the scrotum. 4. No other abnormalities. Electronically Signed   By: Dorise Bullion III M.D   On: 12/22/2017 20:25    Assessment and Plan:   [name] is  a 47 y.o. y/o male who comes in today with a history of a hepatic adenoma for many years.  The patient has had multiple imaging studies done of the lesion and the size appears stable although has ranged between 5 and over 6 cm.  The patient has been told that he should go on with his surgery for his hernia repair and he should follow-up for definitive treatment of the hepatic adenoma.  As per recommendations a hepatic adenoma over 5 cm would be considered for removal.  The patient has been told to follow-up in 3 months thereby getting the hernia repair taking care of and healing.  At that time we will discuss my recommendations for having the lesion evaluated and possibly removed by a hepatic surgeon.  The patient will also have his labs checked for liver enzymes since his alkaline phosphatase has been elevated on 2 occasions.  Lucilla Lame, MD. [facility]    Note: This dictation was prepared with Dragon dictation along with smaller phrase technology. Any transcriptional errors that result from this process are unintentional.

## 2018-01-22 ENCOUNTER — Ambulatory Visit (INDEPENDENT_AMBULATORY_CARE_PROVIDER_SITE_OTHER): Payer: Self-pay | Admitting: Surgery

## 2018-01-22 ENCOUNTER — Other Ambulatory Visit: Payer: Self-pay

## 2018-01-22 ENCOUNTER — Encounter: Payer: Self-pay | Admitting: Surgery

## 2018-01-22 VITALS — BP 147/94 | HR 85 | Temp 97.7°F | Resp 12 | Ht 70.0 in | Wt 149.0 lb

## 2018-01-22 DIAGNOSIS — K4090 Unilateral inguinal hernia, without obstruction or gangrene, not specified as recurrent: Secondary | ICD-10-CM

## 2018-01-22 NOTE — Progress Notes (Signed)
01/22/2018  History of Present Illness: William Nguyen is a 47 y.o. male with a reducible left inguinal hernia.  He presents for follow up.  He continues to cut down on his drinking and smoking and reports he drinks only on weekends, about 12 drinks total (down from 6 pack plus daily) and he only smokes about 8-9 cigarettes per day (down from 1 pack plus per day).  He saw Dr. Wohl with GI for evaluation of his liver mass, hepatic adenoma, which has been increasing in size.  He recommended that he can proceed with inguinal hernia repair and then follow up with him regarding the liver mass and referrals for excision.  Patient reports no new or worsening findings with his left inguinal hernia.  Still bothers him and he still wears a hernia truss which he finds uncomfortable.  He's ready for surgery.  Past Medical History: Past Medical History:  Diagnosis Date  . Alcohol abuse 10/18/2017   drinks a 6 pack daily  . Fracture of angle of right mandible, initial encounter for closed fracture ([facility])   . 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 Reducible left inguinal hernia   . Smoking   . Ulcer    gastric ulcers with bleeding episode     Past Surgical History: Past Surgical History:  Procedure Laterality Date  . CardioPulmonary Exercise Test: CPX  04/2016   Exercise testing with gas exchange shows a moderate functional limitation due to a circulatory limitation -- Peak VO2 53%.  There were no ST-T wave changes with exercise. Resting spirometry suggests mild restriction. Hypertensive response to exercise.  . hand surgery Right     Home Medications: Prior to Admission medications   Medication Sig Start Date End Date Taking? Authorizing Provider  allopurinol (ZYLOPRIM) 100 MG tablet Take 1 tablet (100 mg total) by mouth daily. 12/16/17  Yes Fleming, Zelda W, NP  amLODipine (NORVASC) 5 MG tablet  12/11/17  Yes [provider]  co-enzyme Q-10 30 MG capsule Take 30  mg by mouth 3 (three) times daily.   Yes [provider]  naproxen (NAPROSYN) 500 MG tablet Take 500 mg by mouth 2 (two) times daily with a meal.   Yes [provider]    Allergies: Allergies  Allergen Reactions  . Vicodin [Hydrocodone-Acetaminophen] Nausea And Vomiting    Review of Systems: Review of Systems  Constitutional: Negative for chills and fever.  Respiratory: Negative for shortness of breath.   Cardiovascular: Negative for chest pain.  Gastrointestinal: Negative for abdominal pain, nausea and vomiting.    Physical Exam BP (!) 147/94   Pulse 85   Temp 97.7 F (36.5 C) (Skin)   Resp 12   Ht 5' 10" (1.778 m)   Wt 149 lb (67.6 kg)   SpO2 97%   BMI 21.38 kg/m  CONSTITUTIONAL: No acute distress HEENT:  Normocephalic, atraumatic, extraocular motion intact. RESPIRATORY:  Lungs are clear, and breath sounds are equal bilaterally. Normal respiratory effort without pathologic use of accessory muscles. CARDIOVASCULAR: Heart is regular without murmurs, gallops, or rubs. GI: The abdomen is soft, non-distended, non-tender to palpation.  Left inguinal hernia is easily reducible and has no tenderness on exam.  No hernia on the right.  NEUROLOGIC:  Motor and sensation is grossly normal.  Cranial nerves are grossly intact. PSYCH:  Alert and oriented to person, place and time. Affect is normal.  Labs/Imaging: LFT's from 01/15/18: Total bili 1.0, AST 30, ALT 17,   Alk phos 266.  CT scan 12/20/17: IMPRESSION: 1. The known hepatic mass is similar in size in the interval, thought to most likely represent an adenoma based on the April 06, 2016 MRI.  Mass appears to measure 6.7 by 4.7 by 6.4 cm today versus 6.9 by 5.1 by 6.5 cm in January of 2018. 2. Nonobstructive 4 mm stone in the right kidney. 3. The distal descending colon extends into a left inguinal hernia, extending into the scrotum. 4. No other abnormalities.  Assessment and Plan: This is a 47 y.o. male with  reducible left inguinal hernia and liver mass.  --Discussed with the patient that the mass is stable in size.  Appreciate Dr. Dorothey Baseman assistance and input.  Will proceed with left inguinal hernia repair. --Discussed with the patient the role of an open left inguinal hernia repair, the use of mesh for repair, post-op restrictions, pain control, and outcomes after surgery.  Discussed the risks of bleeding, infection, and injury to surrounding structures.  He is aware that if there is any bowel injury, that would preclude the use of mesh for repair. --Patient will be scheduled for 02/03/18.    Face-to-face time spent with the patient and care providers was 25 minutes, with more than 50% of the time spent counseling, educating, and coordinating care of the patient.     Melvyn Neth, [facility]

## 2018-01-22 NOTE — H&P (View-Only) (Signed)
01/22/2018  History of Present Illness: William Nguyen is a 47 y.o. male with a reducible left inguinal hernia.  He presents for follow up.  He continues to cut down on his drinking and smoking and reports he drinks only on weekends, about 12 drinks total (down from 6 pack plus daily) and he only smokes about 8-9 cigarettes per day (down from 1 pack plus per day).  He saw Dr. Allen Norris with GI for evaluation of his liver mass, hepatic adenoma, which has been increasing in size.  He recommended that he can proceed with inguinal hernia repair and then follow up with him regarding the liver mass and referrals for excision.  Patient reports no new or worsening findings with his left inguinal hernia.  Still bothers him and he still wears a hernia truss which he finds uncomfortable.  He's ready for surgery.  Past Medical History: Past Medical History:  Diagnosis Date  . Alcohol abuse 10/18/2017   drinks a 6 pack daily  . Fracture of angle of right mandible, initial encounter for closed fracture ([place])   . 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 Reducible left inguinal hernia   . Smoking   . Ulcer    gastric ulcers with bleeding episode     Past Surgical History: Past Surgical History:  Procedure Laterality Date  . CardioPulmonary Exercise Test: CPX  04/2016   Exercise testing with gas exchange shows a moderate functional limitation due to a circulatory limitation -- Peak VO2 53%.  There were no ST-T wave changes with exercise. Resting spirometry suggests mild restriction. Hypertensive response to exercise.  . hand surgery Right     Home Medications: Prior to Admission medications   Medication Sig Start Date End Date Taking? Authorizing Provider  allopurinol (ZYLOPRIM) 100 MG tablet Take 1 tablet (100 mg total) by mouth daily. 12/16/17  Yes Gildardo Pounds, NP  amLODipine (NORVASC) 5 MG tablet  12/11/17  Yes [provider]  co-enzyme Q-10 30 MG capsule Take 30  mg by mouth 3 (three) times daily.   Yes [provider]  naproxen (NAPROSYN) 500 MG tablet Take 500 mg by mouth 2 (two) times daily with a meal.   Yes [provider]    Allergies: Allergies  Allergen Reactions  . Vicodin [Hydrocodone-Acetaminophen] Nausea And Vomiting    Review of Systems: Review of Systems  Constitutional: Negative for chills and fever.  Respiratory: Negative for shortness of breath.   Cardiovascular: Negative for chest pain.  Gastrointestinal: Negative for abdominal pain, nausea and vomiting.    Physical Exam BP (!) 147/94   Pulse 85   Temp 97.7 F (36.5 C) (Skin)   Resp 12   Ht 5' 10" (1.778 m)   Wt 149 lb (67.6 kg)   SpO2 97%   BMI 21.38 kg/m  CONSTITUTIONAL: No acute distress HEENT:  Normocephalic, atraumatic, extraocular motion intact. RESPIRATORY:  Lungs are clear, and breath sounds are equal bilaterally. Normal respiratory effort without pathologic use of accessory muscles. CARDIOVASCULAR: Heart is regular without murmurs, gallops, or rubs. GI: The abdomen is soft, non-distended, non-tender to palpation.  Left inguinal hernia is easily reducible and has no tenderness on exam.  No hernia on the right.  NEUROLOGIC:  Motor and sensation is grossly normal.  Cranial nerves are grossly intact. PSYCH:  Alert and oriented to person, place and time. Affect is normal.  Labs/Imaging: LFT's from 01/15/18: Total bili 1.0, AST 30, ALT 17,  Alk phos 266.  CT scan 12/20/17: IMPRESSION: 1. The known hepatic mass is similar in size in the interval, thought to most likely represent an adenoma based on the April 06, 2016 MRI.  Mass appears to measure 6.7 by 4.7 by 6.4 cm today versus 6.9 by 5.1 by 6.5 cm in January of 2018. 2. Nonobstructive 4 mm stone in the right kidney. 3. The distal descending colon extends into a left inguinal hernia, extending into the scrotum. 4. No other abnormalities.  Assessment and Plan: This is a 47 y.o. male with  reducible left inguinal hernia and liver mass.  --Discussed with the patient that the mass is stable in size.  Appreciate Dr. Dorothey Baseman assistance and input.  Will proceed with left inguinal hernia repair. --Discussed with the patient the role of an open left inguinal hernia repair, the use of mesh for repair, post-op restrictions, pain control, and outcomes after surgery.  Discussed the risks of bleeding, infection, and injury to surrounding structures.  He is aware that if there is any bowel injury, that would preclude the use of mesh for repair. --Patient will be scheduled for 02/03/18.    Face-to-face time spent with the patient and care providers was 25 minutes, with more than 50% of the time spent counseling, educating, and coordinating care of the patient.     Melvyn Neth, [facility]

## 2018-01-22 NOTE — Patient Instructions (Addendum)
No heavy lifting over 10 pounds for 6 weeks after surgery  Inguinal Hernia, Adult An inguinal hernia is when fat or your intestines push through a weak spot in a muscle where your leg meets your lower belly (groin). This causes a rounded lump (bulge). This kind of hernia could also be:  In your scrotum, if you are male.  In folds of skin around your vagina, if you are male. There are three types of inguinal hernias. These include:  Hernias that can be pushed back into the belly (are reducible). This type rarely causes pain.  Hernias that cannot be pushed back into the belly (are incarcerated).  Hernias that cannot be pushed back into the belly and lose their blood supply (are strangulated). This type needs emergency surgery. If you do not have symptoms, you may not need treatment. If you have symptoms or a large hernia, you may need surgery. Follow these instructions at home: Lifestyle  Do these things if told by your doctor so you do not have trouble pooping (constipation): ? Drink enough fluid to keep your pee (urine) pale yellow. ? Eat foods that have a lot of fiber. These include fresh fruits and vegetables, whole grains, and beans. ? Limit foods that are high in fat and processed sugars. These include foods that are fried or sweet. ? Take medicine for trouble pooping.  Avoid lifting heavy objects.  Avoid standing for long amounts of time.  Do not use any products that contain nicotine or tobacco. These include cigarettes and e-cigarettes. If you need help quitting, ask your doctor.  Stay at a healthy weight. General instructions  You may try to push your hernia in by very gently pressing on it when you are lying down. Do not try to force the bulge back in if it will not push in easily.  Watch your hernia for any changes in shape, size, or color. Tell your doctor if you see any changes.  Take over-the-counter and prescription medicines only as told by your  doctor.  Keep all follow-up visits as told by your doctor. This is important. Contact a doctor if:  You have a fever.  You have new symptoms.  Your symptoms get worse. Get help right away if:  The area where your leg meets your lower belly has: ? Pain that gets worse suddenly. ? A bulge that gets bigger suddenly, and it does not get smaller after that. ? A bulge that turns red or purple. ? A bulge that is painful when you touch it.  You are a man, and your scrotum: ? Suddenly feels painful. ? Suddenly changes in size.  You cannot push the hernia in by very gently pressing on it when you are lying down. Do not try to force the bulge back in if it will not push in easily.  You feel sick to your stomach (nauseous), and that feeling does not go away.  You throw up (vomit), and that keeps happening.  You have a fast heartbeat.  You cannot poop (have a bowel movement) or pass gas. These symptoms may be an emergency. Do not wait to see if the symptoms will go away. Get medical help right away. Call your local emergency services (911 in the [place] Summary  An inguinal hernia is when fat or your intestines push through a weak spot in a muscle where your leg meets your lower belly (groin). This causes a rounded lump (bulge).  If you do not have symptoms, you may  not need treatment. If you have symptoms or a large hernia, you may need surgery.  Avoid lifting heavy objects. Also avoid standing for long amounts of time.  Do not try to force the bulge back in if it will not push in easily. This information is not intended to replace advice given to you by your health care provider. Make sure you discuss any questions you have with your health care provider. Document Released: 02/22/2006 Document Revised: 02/23/2017 Document Reviewed: 10/24/2016 Elsevier Interactive Patient Education  [employer]   The patient is scheduled for surgery with Dr Hampton Abbot at [facility] on 02/03/18. He will  pre admit by phone. The patient is aware of date and instructions.

## 2018-01-24 ENCOUNTER — Ambulatory Visit: Payer: Self-pay | Attending: Nurse Practitioner | Admitting: Nurse Practitioner

## 2018-01-24 ENCOUNTER — Encounter
Admission: RE | Admit: 2018-01-24 | Discharge: 2018-01-24 | Disposition: A | Discharge status: Home / Self Care | Payer: Self-pay | Source: Ambulatory Visit | Attending: Surgery | Admitting: Surgery

## 2018-01-24 ENCOUNTER — Other Ambulatory Visit: Payer: Self-pay

## 2018-01-24 ENCOUNTER — Encounter: Payer: Self-pay | Admitting: Nurse Practitioner

## 2018-01-24 VITALS — BP 143/88 | HR 86 | Temp 99.4°F | Ht 70.0 in | Wt 148.4 lb

## 2018-01-24 DIAGNOSIS — K4090 Unilateral inguinal hernia, without obstruction or gangrene, not specified as recurrent: Secondary | ICD-10-CM | POA: Insufficient documentation

## 2018-01-24 DIAGNOSIS — F1010 Alcohol abuse, uncomplicated: Secondary | ICD-10-CM | POA: Insufficient documentation

## 2018-01-24 DIAGNOSIS — M1A9XX0 Chronic gout, unspecified, without tophus (tophi): Secondary | ICD-10-CM | POA: Insufficient documentation

## 2018-01-24 DIAGNOSIS — Z79899 Other long term (current) drug therapy: Secondary | ICD-10-CM | POA: Insufficient documentation

## 2018-01-24 DIAGNOSIS — Z8711 Personal history of peptic ulcer disease: Secondary | ICD-10-CM | POA: Insufficient documentation

## 2018-01-24 DIAGNOSIS — Z791 Long term (current) use of non-steroidal anti-inflammatories (NSAID): Secondary | ICD-10-CM | POA: Insufficient documentation

## 2018-01-24 DIAGNOSIS — Z8249 Family history of ischemic heart disease and other diseases of the circulatory system: Secondary | ICD-10-CM | POA: Insufficient documentation

## 2018-01-24 DIAGNOSIS — I10 Essential (primary) hypertension: Secondary | ICD-10-CM | POA: Insufficient documentation

## 2018-01-24 DIAGNOSIS — R160 Hepatomegaly, not elsewhere classified: Secondary | ICD-10-CM | POA: Insufficient documentation

## 2018-01-24 DIAGNOSIS — M1A49X0 Other secondary chronic gout, multiple sites, without tophus (tophi): Secondary | ICD-10-CM

## 2018-01-24 HISTORY — DX: Essential (primary) hypertension: I10

## 2018-01-24 HISTORY — DX: Gastro-esophageal reflux disease without esophagitis: K21.9

## 2018-01-24 HISTORY — DX: Pneumonia, unspecified organism: J18.9

## 2018-01-24 MED ORDER — ALLOPURINOL 100 MG PO TABS: 100.0000 mg | ORAL_TABLET | Freq: Every day | ORAL | 6 refills | Status: DC

## 2018-01-24 MED ORDER — AMLODIPINE BESYLATE 5 MG PO TABS: 5.0000 mg | ORAL_TABLET | Freq: Every day | ORAL | 1 refills | Status: DC

## 2018-01-24 MED FILL — ?ALLOPURINOL 100MG TABLET: 100 | 30 days supply | Qty: 30 | Fill #0

## 2018-01-24 MED FILL — ?AMLODIPINE BESYLATE 5 MG T: 5 MG | 30 days supply | Qty: 30 | Fill #0

## 2018-01-24 NOTE — Progress Notes (Signed)
Assessment & Plan:  William Nguyen was seen today for blood pressure check.  Diagnoses and all orders for this visit:  Essential hypertension -     amLODipine (NORVASC) 5 MG tablet; Take 1 tablet (5 mg total) by mouth daily. Continue all antihypertensives as prescribed.  Remember to bring in your blood pressure log with you for your follow up appointment.  DASH/Mediterranean Diets are healthier choices for HTN.   Other secondary chronic gout of multiple sites without tophus -     Uric Acid -     allopurinol (ZYLOPRIM) 100 MG tablet; Take 1 tablet (100 mg total) by mouth daily.    Patient has been counseled on age-appropriate routine health concerns for screening and prevention. These are reviewed and up-to-date. Referrals have been placed accordingly. Immunizations are up-to-date or declined.    Subjective:   Chief Complaint  Patient presents with  . Blood Pressure Check   HPI William Nguyen 47 y.o. male presents to office today for blood pressure check. He is accompanied by his mother today. He has not been taking his amlodipine or allopurinol as prescribed. States he only takes them as needed. I have instructed him that both of these medications should be taken on a daily basis. He has inguinal hernia surgery scheduled next week. His ALP is elevated. I have instructed William Nguyen to speak with Dr. Allen Norris regarding his recent lab results.      Essential Hypertension Chronic and poorly controlled. He was started on Amlodipine 5mg  in October for poorly controlled HTN. He has not been medication compliant nor does he check his blood pressure at home. Reports he ran out of norvasc and wasn't aware he had refills. Denies chest pain, shortness of breath, palpitations, lightheadedness, dizziness, headaches or BLE edema.  BP Readings from Last 3 Encounters:  01/24/18 (!) 143/88  01/22/18 (!) 147/94  01/15/18 (!) 145/90    Gout  Chronic and poorly controlled. Last uric acid level abnormal. I have  instructed him to take allopurinol as prescribed daily. Will recheck uric acid levels in 6 weeks. He currently denies any gout exacerbation symptoms.  Lab Results  Component Value Date   LABURIC 8.6 12/11/2017   Review of Systems  Constitutional: Negative for fever, malaise/fatigue and weight loss.  HENT: Negative.  Negative for nosebleeds.   Eyes: Negative.  Negative for blurred vision, double vision and photophobia.  Respiratory: Negative.  Negative for cough and shortness of breath.   Cardiovascular: Negative.  Negative for chest pain, palpitations and leg swelling.  Gastrointestinal: Negative.  Negative for heartburn, nausea and vomiting.  Musculoskeletal: Negative.  Negative for myalgias.  Neurological: Negative.  Negative for dizziness, focal weakness, seizures and headaches.  Psychiatric/Behavioral: Negative.  Negative for suicidal ideas.    Past Medical History:  Diagnosis Date  . Alcohol abuse 10/18/2017   drinks a 6 pack daily  . Fracture of angle of right mandible, initial encounter for closed fracture ([place])   . 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 Reducible left inguinal hernia   . Smoking   . Ulcer    gastric ulcers with bleeding episode    Past Surgical History:  Procedure Laterality Date  . CardioPulmonary Exercise Test: CPX  04/2016   Exercise testing with gas exchange shows a moderate functional limitation due to a circulatory limitation -- Peak VO2 53%.  There were no ST-T wave changes with exercise. Resting spirometry suggests mild restriction. Hypertensive response to exercise.  Marland Kitchen  hand surgery Right     Family History  Problem Relation Age of Onset  . Healthy Mother   . Healthy Father   . Healthy Sister   . Healthy Brother   . Heart disease Paternal Grandmother        Unsure of details - still living at age    Social History Reviewed with no changes to be made today.   Outpatient Medications Prior to Visit  Medication Sig  Dispense Refill  . Coenzyme Q10 (CO Q-10) 100 MG CAPS Take 100 mg by mouth daily.    Marland Kitchen amLODipine (NORVASC) 5 MG tablet Take 5 mg by mouth daily as needed (high bp).   3  . Aspirin-Salicylamide-Caffeine (BC HEADACHE PO) Take 1 packet by mouth daily as needed (headaches).    . naproxen (NAPROSYN) 500 MG tablet Take 500 mg by mouth 2 (two) times daily as needed (gout pain).     Marland Kitchen allopurinol (ZYLOPRIM) 100 MG tablet Take 1 tablet (100 mg total) by mouth daily. (Patient not taking: Reported on 01/24/2018) 30 tablet 6   No facility-administered medications prior to visit.     Allergies  Allergen Reactions  . Vicodin [Hydrocodone-Acetaminophen] Nausea And Vomiting       Objective:    BP (!) 143/88   Pulse 86   Temp 99.4 F (37.4 C) (Oral)   Ht 5\' 10"  (1.778 m)   Wt 148 lb 6.4 oz (67.3 kg)   SpO2 97%   BMI 21.29 kg/m  Wt Readings from Last 3 Encounters:  01/24/18 148 lb 6.4 oz (67.3 kg)  01/22/18 149 lb (67.6 kg)  01/15/18 149 lb 3.2 oz (67.7 kg)    Physical Exam Vitals signs and nursing note reviewed.  Constitutional:      Appearance: He is well-developed.  HENT:     Head: Normocephalic and atraumatic.  Neck:     Musculoskeletal: Normal range of motion.  Cardiovascular:     Rate and Rhythm: Normal rate and regular rhythm.     Heart sounds: Normal heart sounds. No murmur. No friction rub. No gallop.   Pulmonary:     Effort: Pulmonary effort is normal. No tachypnea or respiratory distress.     Breath sounds: Normal breath sounds. No decreased breath sounds, wheezing, rhonchi or rales.  Chest:     Chest wall: No tenderness.  Abdominal:     General: Bowel sounds are normal.     Palpations: Abdomen is soft.  Musculoskeletal: Normal range of motion.  Skin:    General: Skin is warm and dry.  Neurological:     Mental Status: He is alert and oriented to person, place, and time.     Coordination: Coordination normal.  Psychiatric:        Behavior: Behavior normal. Behavior  is cooperative.        Thought Content: Thought content normal.        Judgment: Judgment normal.          Patient has been counseled extensively about nutrition and exercise as well as the importance of adherence with medications and regular follow-up. The patient was given clear instructions to go to ER or return to medical center if symptoms don't improve, worsen or new problems develop. The patient verbalized understanding.   Follow-up: Return in about 6 weeks (around 03/07/2018) for Lab appointment uric acid and BP recheck .   Gildardo Pounds, FNP-BC [facility] [place], [place]   01/24/2018, 11:50 AM

## 2018-01-24 NOTE — Patient Instructions (Signed)
Your procedure is scheduled on: 02-03-18 MONDAY Report to Same Day Surgery 2nd floor medical mall [facility] Entrance-take elevator on left to 2nd floor.  Check in with surgery information desk.) To find out your arrival time please call [phone] between 1PM - 3PM on 01-31-18 FRIDAY  Remember: Instructions that are not followed completely may result in serious medical risk, up to and including death, or upon the discretion of your surgeon and anesthesiologist your surgery may need to be rescheduled.    _x___ 1. Do not eat food after midnight the night before your procedure. NO GUM OR CANDY AFTER MIDNIGHT.  You may drink clear liquids up to 2 hours before you are scheduled to arrive at the hospital for your procedure.  Do not drink clear liquids within 2 hours of your scheduled arrival to the hospital.  Clear liquids include  --Water or Apple juice without pulp  --Clear carbohydrate beverage such as ClearFast or Gatorade  --Black Coffee or Clear Tea (No milk, no creamers, do not add anything to the coffee or Tea   ____Ensure clear carbohydrate drink on the way to the hospital for bariatric patients  ____Ensure clear carbohydrate drink 3 hours before surgery for Dr Dwyane Luo patients if physician instructed.   No gum chewing or hard candies.     __x__ 2. No Alcohol for 24 hours before or after surgery.   __x__3. No Smoking or e-cigarettes for 24 prior to surgery.  Do not use any chewable tobacco products for at least 6 hour prior to surgery   ____  4. Bring all medications with you on the day of surgery if instructed.    __x__ 5. Notify your doctor if there is any change in your medical condition     (cold, fever, infections).    x___6. On the morning of surgery brush your teeth with toothpaste and water.  You may rinse your mouth with mouth wash if you wish.  Do not swallow any toothpaste or mouthwash.   Do not wear jewelry, make-up, hairpins, clips or nail polish.  Do not  wear lotions, powders, or perfumes. You may wear deodorant.  Do not shave 48 hours prior to surgery. Men may shave face and neck.  Do not bring valuables to the hospital.    [facility] is not responsible for any belongings or valuables.               Contacts, dentures or bridgework may not be worn into surgery.  Leave your suitcase in the car. After surgery it may be brought to your room.  For patients admitted to the hospital, discharge time is determined by your treatment team.  _  Patients discharged the day of surgery will not be allowed to drive home.  You will need someone to drive you home and stay with you the night of your procedure.    Please read over the following fact sheets that you were given:   [facility] Preparing for Surgery and or MRSA Information   _x___ TAKE THE FOLLOWING MEDICATION THE MORNING OF SURGERY WITH A SMALL SIP OF WATER. These include:  1. AMLODIPINE (NORVASC)  2. ALLOPURINOL (  3.  4.  5.  6.  ____Fleets enema or Magnesium Citrate as directed.   _x___ Use CHG Soap or sage wipes as directed on instruction sheet   ____ Use inhalers on the day of surgery and bring to hospital day of surgery  ____ Stop Metformin and Janumet 2 days prior  to surgery.    ____ Take 1/2 of usual insulin dose the night before surgery and none on the morning surgery.   ____ Follow recommendations from Cardiologist, Pulmonologist or PCP regarding stopping Aspirin, Coumadin, Plavix ,Eliquis, Effient, or Pradaxa, and Pletal.  X____Stop Anti-inflammatories such as Advil, Aleve, Ibuprofen, Motrin, Naproxen, Naprosyn, Goodies powders or aspirin products NOW-OK to take Tylenol    _x___ Stop supplements until after surgery-STOP CO Q-10 NOW-MAY RESUME AFTER SURGERY   ____ Bring C-Pap to the hospital.

## 2018-01-24 NOTE — Pre-Procedure Instructions (Signed)
Result Notes for Cardiopulmonary exercise test   Notes recorded by Raiford Simmonds, RN on 05/10/2016 at 3:24 PM EDT Spoke to patient. CPX Result given . Verbalized understanding PATIENT WOULD LIKE TO WAIT UNTIL APPOINTMENT ON 05/14/16- and then proceed with future test if needed   ------  Notes recorded by Fidel Levy, RN on 05/08/2016 at 3:48 PM EDT [facility] ------  Notes recorded by Leonie Man, MD on 05/08/2016 at 7:40 AM EDT The exercise test did show both long and cardiovascular abnormalities. 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 High blood pressure response to exercise just that he is out of shape and possible cardiac dysfunction. The next step in evaluation would be an echocardiogram if not already ordered. We can then discuss whether or not we do a Myoview stress test versus cardiac cath at clinic followup.  Glenetta Hew, MD  Cardiopulmonary exercise test  Order: [id number]  Status:  Final result Visible to patient:  Yes (MyChart) Next appt:  03/03/2018 at 09:00 AM in Internal Medicine ([facility]) Dx:  Dyspnea on exertion; Coronary artery ...    Narrative   Referred for: Dyspnea  Procedure: This patient underwent staged symptom-limited exercise treadmill testing using an individualized treadmill protocol with expired gas analysis metabolic evaluation during exercise.  Demographics  Age: 47 Ht. (in.) 6 Wt. (lb) 149 BMI: 21.4   Predicted Peak VO2: 38.5  Gender: Male Ht (cm) 177.8 Wt. (kg) 67.6    Results  Pre-Exercise PFTs   FVC 3.78 (73%)     FEV1 2.69 (65%)      FEV1/FVC 71 (88%)      MVV 112 (69%)      Exercise Time:  9:30  Speed (mph): 3.0    Grade (%): 7.5   RPE: 17  Reason stopped: Patient ended test due to leg fatigue.  Additional symptoms: Dyspena (7/10)  Resting HR: 90 Peak HR: 154  (87% age predicted max HR)  BP rest: 144/96 BP peak: 218/86  Peak VO2: 20.5 (53% predicted peak VO2)  VE/VCO2  slope: 35  OUES: 1.11   Peak RER: 1.20  Ventilatory Threshold: 14.6 (38% predicted or measured peak VO2)  Peak RR 32  Peak Ventilation: 60.3  VE/MVV: 54%  PETCO2 at peak: 30  O2pulse: 9  (60% predicted O2pulse)   Interpretation  Notes: Patient gave a very good effort. Pulse-oximetry remained 99-10% for the duration of exercise. Exercise was performed on on a treadmill beginning at 2.38mph and 0% grade increasing to steady 3.0 mph and 0% grade with 2.5% grade every other minute. (see attached document for time-down stages)  ECG: Resting ECG in normal sinus rhythm with T-wave abnormality and likely LVH. HR response appropriate. There were no sustained arrhythmias and no ST-T changes. BP mildly hypertensive at rest with exaggerated response to exercise at low-workloads, becoming increasingly hypertensive at peak exercise.   PFT: Pre-exercise spirometry demonstrates restrictive lung patterns. The MVV was slightly below normal.   CPX: Exercise testing with gas exchange demonstrates a severely reduced peak VO2 of 20.5 ml/kg/min (53% of the age/gender/weight matched sedentary norms). The RER of 1.20 indicates a maximal effort. The VE/VCO2 slope is elevated and indicates excessive dead space ventilation. The oxygen uptake efficiency slope (OUES) is severely reduced. The VO2 at the ventilatory threshold was well below normal at 38% of the predicted peak VO2. At peak exercise, the ventilation reached 54% of the measured MVV indicating ventilatory reserve remained. The O2pulse (a surrogate for stroke volume) increased only very  mildly at start of exercise, remained blunted and flat for the entirety of incremental exercise protocol, reaching peak at 9 ml/beat (60% predicted) .    Conclusion: Exercise testing with gas exchange demonstrates a moderate functional impairment when compared to matched sedentary norms. Pre-exercise spirometry demonstrates mild restrictive lung physiology, which  is likely contributing to exercise intolerance and dyspnea. However, with incremental exercise, patient is most likely primarily circulatory limited, given hypertensive responses during exercise and blunted O2 pulse response. Poor OUES and elevated VE/VCO2 slope indicate cardiovascular limitations are highly likely.  Test, report and preliminary impression by: Landis Martins, MS, ACSM-RCEP 04/27/2016 10:57 AM  Exercise testing with gas exchange shows a moderate functional limitation due to a circulatory limitation. There were no ST-T wave changes with exercise. Resting spirometry suggests mild restriction. Hypertensive response to exercise. Suggest echocardiogram.  Glori Bickers, MD  9:44 PM   Glori Bickers, MD  9:40 PM         Last Resulted: 04/27/16 10:17      Order Details     View Encounter     Lab and Collection Details     Routing     Result History - Result Edited        Scans on Order [id number]   Document on 04/27/2016 10:34 AM by Landis Martins M: [email or web address]       Result Notes for Cardiopulmonary exercise test   Notes recorded by Raiford Simmonds, RN on 05/10/2016 at 3:24 PM EDT Spoke to patient. CPX Result given . Verbalized understanding PATIENT WOULD LIKE TO WAIT UNTIL APPOINTMENT ON 05/14/16- and then proceed with future test if needed   ------  Notes recorded by Fidel Levy, RN on 05/08/2016 at 3:48 PM EDT [facility] ------  Notes recorded by Leonie Man, MD on 05/08/2016 at 7:40 AM EDT The exercise test did show both long and cardiovascular abnormalities. 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 High blood pressure response to exercise just that he is out of shape and possible cardiac dysfunction. The next step in evaluation would be an echocardiogram if not already ordered. We can then discuss whether or not we do a Myoview stress test versus cardiac cath at clinic followup.  Glenetta Hew, MD      Patient  Result Comments   Viewed by [name] on 05/29/2016 11:18 AM  Written by Leonie Man, MD on 05/08/2016 7:40 AM  The exercise test did show both long and cardiovascular abnormalities.  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 High blood pressure response to exercise just that he is out of shape and possible cardiac dysfunction.  The next step in evaluation would be an echocardiogram if not already ordered. We can then discuss whether or not we do a Myoview stress test versus cardiac cath at clinic followup.   Glenetta Hew, MD   All Reviewers List   Leonie Man, MD on 05/10/2016 18:57  Leonie Man, MD on 04/28/2016 22:04  Encounter   View Encounter       Result Information   Status: Final result (Resulted: 04/27/2016 10:17) Provider Status: Reviewed    Order-Level Documents - 04/27/2016:   Document on 04/27/2016 10:34 AM by Landis Martins M: [email or web address]

## 2018-01-24 NOTE — Pre-Procedure Instructions (Signed)
Result Notes for ECHOCARDIOGRAM COMPLETE   Notes recorded by Cristopher Estimable, RN on 05/30/2016 at 11:26 AM EDT Results released to my chart ------  Notes recorded by Raiford Simmonds, RN on 05/29/2016 at 10:37 AM EDT LEFT DETAILED MESSAGE OF RESULT ON SECURE VOICE MAIL PER DPI. ANY QUESTION MAY CALL BACK  ------  Notes recorded by Leonie Man, MD on 05/28/2016 at 5:45 PM EDT Echo results: Good news: Essentially normal echocardiogram and normal pump function and normal valve function. No regional wall motion abnormalities = No signs to suggest heart attack.. EF: 55% is NORMAL (percentage of blood pumped out of the heart)  Glenetta Hew, MD        Study Result   Result status: Final result                           [facility]                        [address],  [address]                            [phone]  ------------------------------------------------------------------- Transthoracic Echocardiography  Patient:    Derral, Colucci MR #:       [id number] Study Date: 05/28/2016 Gender:     M Age:        47 Height:     177.8 cm Weight:     65.4 kg BSA:        1.79 m^2 Pt. Status: Room:   SONOGRAPHER  Diamond Nickel  ATTENDING    Loralie Champagne, M.D.  ORDERING     Glenetta Hew, MD  [place], MD  PERFORMING   [facility] Outpatient  cc:  ------------------------------------------------------------------- LV EF: 55%  ------------------------------------------------------------------- Indications:      Dyspnea - R06.00.  ------------------------------------------------------------------- History:   PMH:   Coronary artery disease.  Risk factors:  Current tobacco use.  ------------------------------------------------------------------- Study Conclusions  - Left ventricle: The cavity size was normal. Wall thickness was   normal. The estimated ejection fraction was 55%. Wall motion  was   normal; there were no regional wall motion abnormalities. Doppler   parameters are consistent with abnormal left ventricular   relaxation (grade 1 diastolic dysfunction). - Aortic valve: There was no stenosis. - Mitral valve: There was trivial regurgitation. - Left atrium: The atrium was mildly dilated. - Right ventricle: The cavity size was normal. Systolic function   was normal. - Tricuspid valve: Peak RV-RA gradient (S): 15 mm Hg. - Inferior vena cava: The vessel was normal in size. The   respirophasic diameter changes were in the normal range (>= 50%),   consistent with normal central venous pressure.  Impressions:  - Normal LV size with EF 55%. Normal RV size and systolic function.   No significant valvular abnormalities.  ------------------------------------------------------------------- Study data:   Study status:  Routine.  Procedure:  The patient reported no pain pre or post test. Transthoracic echocardiography. Image quality was adequate.  Study completion:  There were no complications.          Transthoracic echocardiography.  M-mode, complete 2D, spectral Doppler, and color Doppler.  Birthdate: Patient birthdate: [birth year].  Age:  Patient  is 47 yr old.  Sex: Gender: male.    BMI: 20.7 kg/m^2.  Blood pressure:     132/87 Patient status:  Outpatient.  Study date:  Study date: 05/28/2016. Study time: 08:05 AM.  Location:  [facility]  -------------------------------------------------------------------  ------------------------------------------------------------------- Left ventricle:  The cavity size was normal. Wall thickness was normal. The estimated ejection fraction was 55%. Wall motion was normal; there were no regional wall motion abnormalities. Doppler parameters are consistent with abnormal left ventricular relaxation (grade 1 diastolic dysfunction).  ------------------------------------------------------------------- Aortic valve:    Trileaflet.  Doppler:   There was no stenosis. There was no regurgitation.  ------------------------------------------------------------------- Aorta:  Aortic root: The aortic root was normal in size. Ascending aorta: The ascending aorta was normal in size.  ------------------------------------------------------------------- Mitral valve:   Normal thickness leaflets .  Doppler:   There was no evidence for stenosis.   There was trivial regurgitation. Peak gradient (D): 2 mm Hg.  ------------------------------------------------------------------- Left atrium:  The atrium was mildly dilated.  ------------------------------------------------------------------- Right ventricle:  The cavity size was normal. Systolic function was normal.  ------------------------------------------------------------------- Pulmonic valve:    Structurally normal valve.   Cusp separation was normal.  Doppler:  Transvalvular velocity was within the normal range. There was no regurgitation.  ------------------------------------------------------------------- Tricuspid valve:   Doppler:  There was trivial regurgitation.   ------------------------------------------------------------------- Right atrium:  The atrium was normal in size.  ------------------------------------------------------------------- Pericardium:  There was no pericardial effusion.  ------------------------------------------------------------------- Systemic veins: Inferior vena cava: The vessel was normal in size. The respirophasic diameter changes were in the normal range (>= 50%), consistent with normal central venous pressure.  ------------------------------------------------------------------- Measurements   Left ventricle                         Value        Reference  LV ID, ED, PLAX chordal                48.7  mm     43 - 52  LV ID, ES, PLAX chordal                35.4  mm     23 - 38  LV fx shortening, PLAX  chordal (L)     27    %      >=29  LV PW thickness, ED                    11.6  mm     ---------  IVS/LV PW ratio, ED                    0.94         <=1.3  LV e&', lateral                         9.65  cm/s   ---------  LV E/e&', lateral                       7.67         ---------  LV e&', medial                          7.24  cm/s   ---------  LV E/e&', medial  10.22        ---------  LV e&', average                         8.45  cm/s   ---------  LV E/e&', average                       8.76         ---------    Ventricular septum                     Value        Reference  IVS thickness, ED                      10.9  mm     ---------    LVOT                                   Value        Reference  LVOT ID, S                             21    mm     ---------  LVOT area                              3.46  cm^2   ---------    Aorta                                  Value        Reference  Aortic root ID, ED                     32    mm     ---------    Left atrium                            Value        Reference  LA ID, A-P, ES                         38    mm     ---------  LA ID/bsa, A-P                         2.12  cm/m^2 <=2.2  LA volume, S                           64    ml     ---------  LA volume/bsa, S                       35.7  ml/m^2 ---------  LA volume, ES, 1-p A4C                 61    ml     ---------  LA volume/bsa, ES, 1-p A4C             34    ml/m^2 ---------  LA volume, ES, 1-p A2C  66    ml     ---------  LA volume/bsa, ES, 1-p A2C             36.8  ml/m^2 ---------    Mitral valve                           Value        Reference  Mitral E-wave peak velocity            74    cm/s   ---------  Mitral A-wave peak velocity            83.9  cm/s   ---------  Mitral deceleration time               211   ms     150 - 230  Mitral peak gradient, D                2     mm Hg  ---------  Mitral E/A ratio, peak                 0.9           ---------    Pulmonary arteries                     Value        Reference  PA pressure, S, DP                     18    mm Hg  <=30    Tricuspid valve                        Value        Reference  Tricuspid regurg peak velocity         193   cm/s   ---------  Tricuspid peak RV-RA gradient          15    mm Hg  ---------    Systemic veins                         Value        Reference  Estimated CVP                          3     mm Hg  ---------    Right ventricle                        Value        Reference  RV s&', lateral, S                      12.5  cm/s   ---------  Legend: (L)  and  (H)  mark values outside specified reference range.  ------------------------------------------------------------------- Prepared and Electronically Authenticated by  Loralie Champagne, M.D. 2018-04-23T13:22:44  [employer] Images   Show images for ECHOCARDIOGRAM COMPLETE  MERGE Images   Show images for ECHOCARDIOGRAM COMPLETE  Performing Technologist/Nurse   Performing Technologist/Nurse: Valinda Hoar, RCS  Reason for Exam  Priority: Routine  Dx: Dyspnea on exertion [R06.09 (ICD-10-CM)]; Abnormal finding on cardiovascular stress test [R94.39 (ICD-10-CM)]  Comments: Abnormal CARDIOPULM. TEST ( CPX)  Surgical History   Surgical History   No past medical history on file.    Other Surgical History   Procedure Laterality Date Comment Source  CardioPulmonary Exercise Test: CPX  04/2016 Exercise testing with gas exchange shows a moderate functional limitation due to a circulatory limitation -- Peak VO2 53%. There were no ST-T wave changes with exercise. Resting spirometry suggests mild restriction. Hypertensive response to exercise. Provider  hand surgery Right   Provider    Implants    No active implants to display in this view.  Order-Level Documents:   There are no order-level documents.  Encounter-Level Documents - 05/28/2016:   Scan  on 06/05/2016 2:44 PM by Default, Provider, MD  Electronic signature on 05/28/2016 8:21 AM: CSN: [id number] - Signed  Electronic signature on 05/28/2016 8:21 AM - Signed      Resulted by:   Signed Date/Time  Phone Pager  Larey Dresser 05/28/2016 1:23 PM [phone]   External Result Report   External Result Report     Patient Result Comments   Viewed by [name] on 06/18/2016 6:41 PM  Written by Leonie Man, MD on 05/28/2016 5:45 PM  Echo results:  Good news: Essentially normal echocardiogram and normal pump function and normal valve function. No regional wall motion abnormalities = No signs to suggest heart attack..  EF: 55% is NORMAL (percentage of blood pumped out of the heart)   Glenetta Hew, MD

## 2018-01-24 NOTE — Patient Instructions (Signed)
Gout  Gout is painful swelling of your joints. Gout is a type of arthritis. It is caused by having too much uric acid in your body. Uric acid is a chemical that is made when your body breaks down substances called purines. If your body has too much uric acid, sharp crystals can form and build up in your joints. This causes pain and swelling. Gout attacks can happen quickly and be very painful (acute gout). Over time, the attacks can affect more joints and happen more often (chronic gout). What are the causes?  Too much uric acid in your blood. This can happen because: ? Your kidneys do not remove enough uric acid from your blood. ? Your body makes too much uric acid. ? You eat too many foods that are high in purines. These foods include organ meats, some seafood, and beer.  Trauma or stress. What increases the risk?  Having a family history of gout.  Being male and middle-aged.  Being male and having gone through menopause.  Being very overweight (obese).  Drinking alcohol, especially beer.  Not having enough water in the body (being dehydrated).  Losing weight too quickly.  Having an organ transplant.  Having lead poisoning.  Taking certain medicines.  Having kidney disease.  Having a skin condition called psoriasis. What are the signs or symptoms? An attack of acute gout usually happens in just one joint. The most common place is the big toe. Attacks often start at night. Other joints that may be affected include joints of the feet, ankle, knee, fingers, wrist, or elbow. Symptoms of an attack may include:  Very bad pain.  Warmth.  Swelling.  Stiffness.  Shiny, red, or purple skin.  Tenderness. The affected joint may be very painful to touch.  Chills and fever. Chronic gout may cause symptoms more often. More joints may be involved. You may also have white or yellow lumps (tophi) on your hands or feet or in other areas near your joints. How is this  treated?  Treatment for this condition has two phases: treating an acute attack and preventing future attacks.  Acute gout treatment may include: ? NSAIDs. ? Steroids. These are taken by mouth or injected into a joint. ? Colchicine. This medicine relieves pain and swelling. It can be given by mouth or through an IV tube.  Preventive treatment may include: ? Taking small doses of NSAIDs or colchicine daily. ? Using a medicine that reduces uric acid levels in your blood. ? Making changes to your diet. You may need to see a food expert (dietitian) about what to eat and drink to prevent gout. Follow these instructions at home: During a gout attack   If told, put ice on the painful area: ? Put ice in a plastic bag. ? Place a towel between your skin and the bag. ? Leave the ice on for 20 minutes, 2-3 times a day.  Raise (elevate) the painful joint above the level of your heart as often as you can.  Rest the joint as much as possible. If the joint is in your leg, you may be given crutches.  Follow instructions from your doctor about what you cannot eat or drink. Avoiding future gout attacks  Eat a low-purine diet. Avoid foods and drinks such as: ? Liver. ? Kidney. ? Anchovies. ? Asparagus. ? Herring. ? Mushrooms. ? Mussels. ? Beer.  Stay at a healthy weight. If you want to lose weight, talk with your doctor. Do not lose weight   drink.  Avoiding future gout attacks   Eat a low-purine diet. Avoid foods and drinks such as:  ? Liver.  ? Kidney.  ? Anchovies.  ? Asparagus.  ? Herring.  ? Mushrooms.  ? Mussels.  ? Beer.   Stay at a healthy weight. If you want to lose weight, talk with your doctor. Do not lose weight too fast.   Start or continue an exercise plan as told by your doctor.  Eating and drinking   Drink enough fluids to keep your pee (urine) pale yellow.   If you drink alcohol:  ? Limit how much you use to:   0-1 drink a day for women.   0-2 drinks a day for men.  ? Be aware of how much alcohol is in your drink. In the [place] one drink equals one 12 oz bottle of beer (355 mL), one 5 oz glass of wine (148 mL), or one 1 oz glass of hard liquor (44 mL).  General instructions   Take over-the-counter and prescription medicines only as told by your doctor.   Do  not drive or use heavy machinery while taking prescription pain medicine.   Return to your normal activities as told by your doctor. Ask your doctor what activities are safe for you.   Keep all follow-up visits as told by your doctor. This is important.  Contact a doctor if:   You have another gout attack.   You still have symptoms of a gout attack after 10 days of treatment.   You have problems (side effects) because of your medicines.   You have chills or a fever.   You have burning pain when you pee (urinate).   You have pain in your lower back or belly.  Get help right away if:   You have very bad pain.   Your pain cannot be controlled.   You cannot pee.  Summary   Gout is painful swelling of the joints.   The most common site of pain is the big toe, but it can affect other joints.   Medicines and avoiding some foods can help to prevent and treat gout attacks.  This information is not intended to replace advice given to you by your health care provider. Make sure you discuss any questions you have with your health care provider.  Document Released: 11/01/2007 Document Revised: 08/14/2017 Document Reviewed: 08/14/2017  Elsevier Interactive Patient Education  2019 [employer]    Low-Purine Eating Plan  A low-purine eating plan involves making food choices to limit your intake of purine. Purine is a kind of uric acid. Too much uric acid in your blood can cause certain conditions, such as gout and kidney stones. Eating a low-purine diet can help control these conditions.  What are tips for following this plan?  Reading food labels     Avoid foods with saturated or Trans fat.   Check the ingredient list of grains-based foods, such as bread and cereal, to make sure that they contain whole grains.   Check the ingredient list of sauces or soups to make sure they do not contain meat or fish.   When choosing soft drinks, check the ingredient list to make sure they do not contain high-fructose corn  syrup.  Shopping   Buy plenty of fresh fruits and vegetables.   Avoid buying canned or fresh fish.   Buy dairy products labeled as low-fat or nonfat.   Avoid buying premade or processed foods. These foods are often   high in fat, salt (sodium), and added sugar.  Cooking   Use olive oil instead of butter when cooking. Oils like olive oil, canola oil, and sunflower oil contain healthy fats.  Meal planning   Learn which foods do or do not affect you. If you find out that a food tends to cause your gout symptoms to flare up, avoid eating that food. You can enjoy foods that do not cause problems. If you have any questions about a food item, talk with your dietitian or health care provider.   Limit foods high in fat, especially saturated fat. Fat makes it harder for your body to get rid of uric acid.   Choose foods that are lower in fat and are lean sources of protein.  General guidelines   Limit alcohol intake to no more than 1 drink a day for nonpregnant women and 2 drinks a day for men. One drink equals 12 oz of beer, 5 oz of wine, or 1 oz of hard liquor. Alcohol can affect the way your body gets rid of uric acid.   Drink plenty of water to keep your urine clear or pale yellow. Fluids can help remove uric acid from your body.   If directed by your health care provider, take a vitamin C supplement.   Work with your health care provider and dietitian to develop a plan to achieve or maintain a healthy weight. Losing weight can help reduce uric acid in your blood.  What foods are recommended?  The items listed may not be a complete list. Talk with your dietitian about what dietary choices are best for you.  Foods low in purines  Foods low in purines do not need to be limited. These include:   All fruits.   All low-purine vegetables, pickles, and olives.   Breads, pasta, rice, cornbread, and popcorn. Cake and other baked goods.   All dairy foods.   Eggs, nuts, and nut butters.   Spices and condiments, such  as salt, herbs, and vinegar.   Plant oils, butter, and margarine.   Water, sugar-free soft drinks, tea, coffee, and cocoa.   Vegetable-based soups, broths, sauces, and gravies.  Foods moderate in purines  Foods moderate in purines should be limited to the amounts listed.    cup of asparagus, cauliflower, spinach, mushrooms, or green peas, each day.   2/3 cup uncooked oatmeal, each day.    cup dry wheat bran or wheat germ, each day.   2-3 ounces of meat or poultry, each day.   4-6 ounces of shellfish, such as crab, lobster, oysters, or shrimp, each day.   1 cup cooked beans, peas, or lentils, each day.   Soup, broths, or bouillon made from meat or fish. Limit these foods as much as possible.  What foods are not recommended?  The items listed may not be a complete list. Talk with your dietitian about what dietary choices are best for you.  Limit your intake of foods high in purines, including:   Beer and other alcohol.   Meat-based gravy or sauce.   Canned or fresh fish, such as:  ? Anchovies, sardines, herring, and tuna.  ? Mussels and scallops.  ? Codfish, trout, and haddock.   Bacon.   Organ meats, such as:  ? Liver or kidney.  ? Tripe.  ? Sweetbreads (thymus gland or pancreas).   Wild game or goose.   Yeast or yeast extract supplements.   Drinks sweetened with high-fructose corn syrup.  Summary  

## 2018-01-25 LAB — URIC ACID: Uric Acid: 8.7 mg/dL — ABNORMAL HIGH (ref 3.7–8.6)

## 2018-01-27 ENCOUNTER — Encounter
Admission: RE | Admit: 2018-01-27 | Discharge: 2018-01-27 | Disposition: A | Discharge status: Home / Self Care | Payer: Self-pay | Source: Ambulatory Visit | Attending: Surgery | Admitting: Surgery

## 2018-01-27 DIAGNOSIS — I10 Essential (primary) hypertension: Secondary | ICD-10-CM

## 2018-01-27 DIAGNOSIS — Z01810 Encounter for preprocedural cardiovascular examination: Secondary | ICD-10-CM | POA: Insufficient documentation

## 2018-01-31 ENCOUNTER — Telehealth (INDEPENDENT_AMBULATORY_CARE_PROVIDER_SITE_OTHER): Payer: Self-pay

## 2018-01-31 ENCOUNTER — Other Ambulatory Visit: Payer: Self-pay | Admitting: Nurse Practitioner

## 2018-01-31 DIAGNOSIS — M1A49X0 Other secondary chronic gout, multiple sites, without tophus (tophi): Secondary | ICD-10-CM

## 2018-01-31 MED ORDER — ALLOPURINOL 100 MG PO TABS: 200.0000 mg | ORAL_TABLET | Freq: Every day | ORAL | 0 refills | Status: DC

## 2018-01-31 NOTE — Telephone Encounter (Signed)
-----   Message from Gildardo Pounds, NP sent at 01/31/2018  8:54 AM EST ----- Uric acid still high. I am increasing your allopurinol to 200mg . Take 2 tablets every day and make a lab appointment to have your uric acid rechecked in 6 weeks.

## 2018-01-31 NOTE — Telephone Encounter (Signed)
Left voicemail notifying patient that uric acid is still high. Allopurinol increased to 200 mg. Take 2 tablets daily and make lab appointment to recheck uric acid in 6 weeks. Please call our office at [phone] to schedule appointment or if you have any questions. Nat Christen, CMA

## 2018-02-02 MED ORDER — CEFAZOLIN SODIUM-DEXTROSE 2-4 GM/100ML-% IV SOLN: 2.0000 g | INTRAVENOUS | Status: DC

## 2018-02-03 ENCOUNTER — Telehealth: Payer: Self-pay | Admitting: *Deleted

## 2018-02-03 ENCOUNTER — Ambulatory Visit: Payer: Self-pay | Admitting: Certified Registered Nurse Anesthetist

## 2018-02-03 ENCOUNTER — Encounter: Payer: Self-pay | Admitting: *Deleted

## 2018-02-03 ENCOUNTER — Encounter
Admission: RE | Disposition: A | Discharge status: Home / Self Care | Payer: Self-pay | Source: Home / Self Care | Attending: Surgery

## 2018-02-03 ENCOUNTER — Ambulatory Visit
Admission: RE | Admit: 2018-02-03 | Discharge: 2018-02-03 | Disposition: A | Discharge status: Home / Self Care | Payer: Self-pay | Attending: Surgery | Admitting: Surgery

## 2018-02-03 ENCOUNTER — Other Ambulatory Visit: Payer: Self-pay

## 2018-02-03 DIAGNOSIS — Z791 Long term (current) use of non-steroidal anti-inflammatories (NSAID): Secondary | ICD-10-CM | POA: Insufficient documentation

## 2018-02-03 DIAGNOSIS — F17210 Nicotine dependence, cigarettes, uncomplicated: Secondary | ICD-10-CM | POA: Insufficient documentation

## 2018-02-03 DIAGNOSIS — Z79899 Other long term (current) drug therapy: Secondary | ICD-10-CM | POA: Insufficient documentation

## 2018-02-03 DIAGNOSIS — Z885 Allergy status to narcotic agent status: Secondary | ICD-10-CM | POA: Insufficient documentation

## 2018-02-03 DIAGNOSIS — R160 Hepatomegaly, not elsewhere classified: Secondary | ICD-10-CM | POA: Insufficient documentation

## 2018-02-03 DIAGNOSIS — Z5309 Procedure and treatment not carried out because of other contraindication: Secondary | ICD-10-CM | POA: Insufficient documentation

## 2018-02-03 DIAGNOSIS — K4090 Unilateral inguinal hernia, without obstruction or gangrene, not specified as recurrent: Secondary | ICD-10-CM | POA: Insufficient documentation

## 2018-02-03 LAB — URINE DRUG SCREEN, QUALITATIVE (ARMC ONLY)
Amphetamines, Ur Screen: NOT DETECTED
Barbiturates, Ur Screen: NOT DETECTED
Benzodiazepine, Ur Scrn: NOT DETECTED
Cannabinoid 50 Ng, Ur ~~LOC~~: NOT DETECTED
Cocaine Metabolite,Ur ~~LOC~~: POSITIVE — AB
MDMA (Ecstasy)Ur Screen: NOT DETECTED
Methadone Scn, Ur: NOT DETECTED
Opiate, Ur Screen: NOT DETECTED
PHENCYCLIDINE (PCP) UR S: NOT DETECTED
Tricyclic, Ur Screen: NOT DETECTED

## 2018-02-03 SURGERY — REPAIR, HERNIA, INGUINAL, ADULT
Anesthesia: Choice

## 2018-02-03 MED ORDER — LACTATED RINGERS IV SOLN
INTRAVENOUS | Status: DC
  Administered 2018-02-03: 08:00:00 via INTRAVENOUS

## 2018-02-03 MED ORDER — GABAPENTIN 300 MG PO CAPS
300.0000 mg | ORAL_CAPSULE | ORAL | Status: AC
  Administered 2018-02-03: 300 mg via ORAL

## 2018-02-03 MED ORDER — FAMOTIDINE 20 MG PO TABS
ORAL_TABLET | ORAL | Status: AC
  Filled 2018-02-03: qty 1

## 2018-02-03 MED ORDER — GABAPENTIN 300 MG PO CAPS
ORAL_CAPSULE | ORAL | Status: AC
  Filled 2018-02-03: qty 1

## 2018-02-03 MED ORDER — CHLORHEXIDINE GLUCONATE CLOTH 2 % EX PADS: 6.0000 | MEDICATED_PAD | Freq: Once | CUTANEOUS | Status: DC

## 2018-02-03 MED ORDER — SUCCINYLCHOLINE CHLORIDE 20 MG/ML IJ SOLN
INTRAMUSCULAR | Status: AC
  Filled 2018-02-03: qty 1

## 2018-02-03 MED ORDER — BUPIVACAINE LIPOSOME 1.3 % IJ SUSP: 20.0000 mL | Freq: Once | INTRAMUSCULAR | Status: DC

## 2018-02-03 MED ORDER — ACETAMINOPHEN 500 MG PO TABS
1000.0000 mg | ORAL_TABLET | ORAL | Status: AC
  Administered 2018-02-03: 1000 mg via ORAL

## 2018-02-03 MED ORDER — CEFAZOLIN SODIUM-DEXTROSE 2-4 GM/100ML-% IV SOLN
INTRAVENOUS | Status: AC
  Filled 2018-02-03: qty 100

## 2018-02-03 MED ORDER — MIDAZOLAM HCL 2 MG/2ML IJ SOLN
INTRAMUSCULAR | Status: AC
  Filled 2018-02-03: qty 2

## 2018-02-03 MED ORDER — GLYCOPYRROLATE 0.2 MG/ML IJ SOLN
INTRAMUSCULAR | Status: AC
  Filled 2018-02-03: qty 1

## 2018-02-03 MED ORDER — ACETAMINOPHEN 500 MG PO TABS
ORAL_TABLET | ORAL | Status: AC
  Filled 2018-02-03: qty 2

## 2018-02-03 MED ORDER — ONDANSETRON HCL 4 MG/2ML IJ SOLN
INTRAMUSCULAR | Status: AC
  Filled 2018-02-03: qty 2

## 2018-02-03 MED ORDER — FAMOTIDINE 20 MG PO TABS
20.0000 mg | ORAL_TABLET | Freq: Once | ORAL | Status: AC
  Administered 2018-02-03: 20 mg via ORAL

## 2018-02-03 MED ORDER — LIDOCAINE HCL (PF) 2 % IJ SOLN
INTRAMUSCULAR | Status: AC
  Filled 2018-02-03: qty 10

## 2018-02-03 MED ORDER — DEXAMETHASONE SODIUM PHOSPHATE 10 MG/ML IJ SOLN
INTRAMUSCULAR | Status: AC
  Filled 2018-02-03: qty 1

## 2018-02-03 MED ORDER — PROPOFOL 10 MG/ML IV BOLUS
INTRAVENOUS | Status: AC
  Filled 2018-02-03: qty 20

## 2018-02-03 MED ORDER — FENTANYL CITRATE (PF) 100 MCG/2ML IJ SOLN
INTRAMUSCULAR | Status: AC
  Filled 2018-02-03: qty 2

## 2018-02-03 SURGICAL SUPPLY — 35 items
ADH SKN CLS APL DERMABOND .7 (GAUZE/BANDAGES/DRESSINGS) ×1
BLADE SURG 15 STRL LF DISP TIS (BLADE) ×2 IMPLANT
BLADE SURG 15 STRL SS (BLADE) ×3
CANISTER SUCT 1200ML W/VALVE (MISCELLANEOUS) ×4 IMPLANT
CHLORAPREP W/TINT 26ML (MISCELLANEOUS) ×4 IMPLANT
COVER WAND RF STERILE (DRAPES) ×4 IMPLANT
DERMABOND ADVANCED (GAUZE/BANDAGES/DRESSINGS) ×2
DERMABOND ADVANCED .7 DNX12 (GAUZE/BANDAGES/DRESSINGS) ×2 IMPLANT
DRAIN PENROSE 1/4X12 LTX (DRAIN) ×4 IMPLANT
DRAPE LAPAROTOMY 100X77 ABD (DRAPES) ×4 IMPLANT
ELECT CAUTERY BLADE 6.4 (BLADE) ×4 IMPLANT
ELECT REM PT RETURN 9FT ADLT (ELECTROSURGICAL) ×3
ELECTRODE REM PT RTRN 9FT ADLT (ELECTROSURGICAL) ×2 IMPLANT
GLOVE SURG SYN 7.0 (GLOVE) ×3 IMPLANT
GLOVE SURG SYN 7.0 PF PI (GLOVE) ×1 IMPLANT
GLOVE SURG SYN 7.5  E (GLOVE) ×2
GLOVE SURG SYN 7.5 E (GLOVE) ×1 IMPLANT
GLOVE SURG SYN 7.5 PF PI (GLOVE) ×1 IMPLANT
GOWN STRL REUS W/ TWL LRG LVL3 (GOWN DISPOSABLE) ×4 IMPLANT
GOWN STRL REUS W/TWL LRG LVL3 (GOWN DISPOSABLE) ×6
LABEL OR SOLS (LABEL) ×4 IMPLANT
NEEDLE HYPO 22GX1.5 SAFETY (NEEDLE) ×4 IMPLANT
NS IRRIG 500ML POUR BTL (IV SOLUTION) ×4 IMPLANT
PACK BASIN MINOR ARMC (MISCELLANEOUS) ×4 IMPLANT
SPONGE LAP 18X18 RF (DISPOSABLE) ×4 IMPLANT
SUT MNCRL 4-0 (SUTURE) ×3
SUT MNCRL 4-0 27XMFL (SUTURE) ×1
SUT PROLENE 2 0 SH DA (SUTURE) ×8 IMPLANT
SUT VIC AB 2-0 CT1 (SUTURE) ×4 IMPLANT
SUT VIC AB 3-0 SH 27 (SUTURE) ×3
SUT VIC AB 3-0 SH 27X BRD (SUTURE) ×2 IMPLANT
SUTURE MNCRL 4-0 27XMF (SUTURE) ×2 IMPLANT
SYR 10ML LL (SYRINGE) ×4 IMPLANT
SYR 30ML LL (SYRINGE) ×4 IMPLANT
SYR BULB IRRIG 60ML STRL (SYRINGE) ×4 IMPLANT

## 2018-02-03 NOTE — Addendum Note (Signed)
Addended by: Riki Sheer on: 02/03/2018 02:21 PM   Modules accepted: Orders, SmartSet

## 2018-02-03 NOTE — Telephone Encounter (Signed)
Patient agreeable to rescheduling surgery to 02-17-18.  The patient is aware that all instructions will remain the same. He is aware to call the office should he have further questions.

## 2018-02-03 NOTE — Progress Notes (Signed)
02/03/18  Patient is here scheduled for left inguinal hernia repair.  He has a history of cocaine use and reports he used cocaine a week ago.  He tested positive today.  After discussing with anesthesia, his surgery will be canceled today.  Our office will contact him to reschedule.  Discussed with patient the importance of staying away from drugs, not just for surgery but for his overall health.  Olean Ree, MD.

## 2018-02-03 NOTE — Interval H&P Note (Deleted)
History and Physical Interval Note:  02/03/2018 8:08 AM  William Nguyen  has presented today for surgery, with the diagnosis of INGUINAL HERNIA  The various methods of treatment have been discussed with the patient and family. After consideration of risks, benefits and other options for treatment, the patient has consented to  Procedure(s): HERNIA REPAIR INGUINAL ADULT (Left) as a surgical intervention .  The patient's history has been reviewed, patient examined, no change in status, stable for surgery.  I have reviewed the patient's chart and labs.  Questions were answered to the patient's satisfaction.     Nehemias Sauceda

## 2018-02-03 NOTE — Telephone Encounter (Signed)
-----   Message from Olean Ree, MD sent at 02/03/2018  9:40 AM EST ----- Regarding: reschedule case Hi,  I was supposed to do a left inguinal hernia repair on this patient today, but he tested positive for cocaine on his urine tox screen.  We just canceled case.   Looking at the schedule, can we work on rescheduling him for January 13th?  He won't need another office visit.  If y'all can call the patient to work on the date with him, that'd be great.  I did tell him to stay away from drugs.  Thanks!  [employer]

## 2018-02-03 NOTE — H&P (View-Only) (Signed)
02/03/18  Patient is here scheduled for left inguinal hernia repair.  He has a history of cocaine use and reports he used cocaine a week ago.  He tested positive today.  After discussing with anesthesia, his surgery will be canceled today.  Our office will contact him to reschedule.  Discussed with patient the importance of staying away from drugs, not just for surgery but for his overall health.  Olean Ree, MD.

## 2018-02-12 MED FILL — ?ALLOPURINOL 100MG TABLET: 100 | 30 days supply | Qty: 60 | Fill #0

## 2018-02-14 ENCOUNTER — Encounter: Payer: Self-pay | Admitting: *Deleted

## 2018-02-16 MED ORDER — CEFAZOLIN SODIUM-DEXTROSE 2-4 GM/100ML-% IV SOLN: 2.0000 g | INTRAVENOUS | Status: DC

## 2018-02-17 ENCOUNTER — Encounter
Admission: RE | Disposition: A | Discharge status: Home / Self Care | Payer: Self-pay | Source: Home / Self Care | Attending: Surgery

## 2018-02-17 ENCOUNTER — Ambulatory Visit: Payer: Self-pay | Admitting: Certified Registered"

## 2018-02-17 ENCOUNTER — Encounter: Payer: Self-pay | Admitting: *Deleted

## 2018-02-17 ENCOUNTER — Ambulatory Visit
Admission: RE | Admit: 2018-02-17 | Discharge: 2018-02-17 | Disposition: A | Discharge status: Home / Self Care | Payer: Self-pay | Attending: Surgery | Admitting: Surgery

## 2018-02-17 DIAGNOSIS — Z79899 Other long term (current) drug therapy: Secondary | ICD-10-CM | POA: Insufficient documentation

## 2018-02-17 DIAGNOSIS — I10 Essential (primary) hypertension: Secondary | ICD-10-CM | POA: Insufficient documentation

## 2018-02-17 DIAGNOSIS — K219 Gastro-esophageal reflux disease without esophagitis: Secondary | ICD-10-CM | POA: Insufficient documentation

## 2018-02-17 DIAGNOSIS — D176 Benign lipomatous neoplasm of spermatic cord: Secondary | ICD-10-CM | POA: Insufficient documentation

## 2018-02-17 DIAGNOSIS — I2510 Atherosclerotic heart disease of native coronary artery without angina pectoris: Secondary | ICD-10-CM | POA: Insufficient documentation

## 2018-02-17 DIAGNOSIS — F17200 Nicotine dependence, unspecified, uncomplicated: Secondary | ICD-10-CM | POA: Insufficient documentation

## 2018-02-17 DIAGNOSIS — K4090 Unilateral inguinal hernia, without obstruction or gangrene, not specified as recurrent: Secondary | ICD-10-CM | POA: Insufficient documentation

## 2018-02-17 HISTORY — PX: INGUINAL HERNIA REPAIR: SHX194

## 2018-02-17 HISTORY — DX: Cocaine abuse, uncomplicated: F14.10

## 2018-02-17 LAB — URINE DRUG SCREEN, QUALITATIVE (ARMC ONLY)
Amphetamines, Ur Screen: NOT DETECTED
Barbiturates, Ur Screen: NOT DETECTED
Benzodiazepine, Ur Scrn: NOT DETECTED
Cannabinoid 50 Ng, Ur ~~LOC~~: NOT DETECTED
Cocaine Metabolite,Ur ~~LOC~~: NOT DETECTED
MDMA (Ecstasy)Ur Screen: NOT DETECTED
Methadone Scn, Ur: NOT DETECTED
Opiate, Ur Screen: NOT DETECTED
Phencyclidine (PCP) Ur S: NOT DETECTED
Tricyclic, Ur Screen: NOT DETECTED

## 2018-02-17 SURGERY — REPAIR, HERNIA, INGUINAL, ADULT
Anesthesia: General | Laterality: Left

## 2018-02-17 MED ORDER — GABAPENTIN 300 MG PO CAPS
300.0000 mg | ORAL_CAPSULE | ORAL | Status: AC
  Administered 2018-02-17: 300 mg via ORAL

## 2018-02-17 MED ORDER — ACETAMINOPHEN 500 MG PO TABS
1000.0000 mg | ORAL_TABLET | ORAL | Status: AC
  Administered 2018-02-17: 1000 mg via ORAL

## 2018-02-17 MED ORDER — BUPIVACAINE LIPOSOME 1.3 % IJ SUSP
INTRAMUSCULAR | Status: DC | PRN
  Administered 2018-02-17: 20 mL

## 2018-02-17 MED ORDER — DEXAMETHASONE SODIUM PHOSPHATE 10 MG/ML IJ SOLN
INTRAMUSCULAR | Status: DC | PRN
  Administered 2018-02-17: 10 mg via INTRAVENOUS

## 2018-02-17 MED ORDER — SUGAMMADEX SODIUM 500 MG/5ML IV SOLN
INTRAVENOUS | Status: DC | PRN
  Administered 2018-02-17: 220 mg via INTRAVENOUS

## 2018-02-17 MED ORDER — FENTANYL CITRATE (PF) 100 MCG/2ML IJ SOLN: 25.0000 ug | INTRAMUSCULAR | Status: DC | PRN

## 2018-02-17 MED ORDER — IBUPROFEN 600 MG PO TABS: 600.0000 mg | ORAL_TABLET | Freq: Three times a day (TID) | ORAL | 0 refills | Status: AC | PRN

## 2018-02-17 MED ORDER — PHENYLEPHRINE HCL 10 MG/ML IJ SOLN
INTRAMUSCULAR | Status: DC | PRN
  Administered 2018-02-17: 100 ug via INTRAVENOUS
  Administered 2018-02-17: 200 ug via INTRAVENOUS
  Administered 2018-02-17 (×2): 100 ug via INTRAVENOUS

## 2018-02-17 MED ORDER — SUGAMMADEX SODIUM 500 MG/5ML IV SOLN
INTRAVENOUS | Status: AC
  Filled 2018-02-17: qty 5

## 2018-02-17 MED ORDER — EPHEDRINE SULFATE 50 MG/ML IJ SOLN
INTRAMUSCULAR | Status: AC
  Filled 2018-02-17: qty 1

## 2018-02-17 MED ORDER — FENTANYL CITRATE (PF) 250 MCG/5ML IJ SOLN
INTRAMUSCULAR | Status: AC
  Filled 2018-02-17: qty 5

## 2018-02-17 MED ORDER — FENTANYL CITRATE (PF) 100 MCG/2ML IJ SOLN
INTRAMUSCULAR | Status: DC | PRN
  Administered 2018-02-17 (×2): 50 ug via INTRAVENOUS

## 2018-02-17 MED ORDER — KETOROLAC TROMETHAMINE 15 MG/ML IJ SOLN
INTRAMUSCULAR | Status: DC | PRN
  Administered 2018-02-17: 30 mg via INTRAVENOUS

## 2018-02-17 MED ORDER — PROMETHAZINE HCL 25 MG/ML IJ SOLN: 6.2500 mg | INTRAMUSCULAR | Status: DC | PRN

## 2018-02-17 MED ORDER — BUPIVACAINE LIPOSOME 1.3 % IJ SUSP
INTRAMUSCULAR | Status: AC
  Filled 2018-02-17: qty 20

## 2018-02-17 MED ORDER — CEFAZOLIN SODIUM-DEXTROSE 2-3 GM-%(50ML) IV SOLR
INTRAVENOUS | Status: DC | PRN
  Administered 2018-02-17: 2 g via INTRAVENOUS

## 2018-02-17 MED ORDER — GLYCOPYRROLATE 0.2 MG/ML IJ SOLN
INTRAMUSCULAR | Status: DC | PRN
  Administered 2018-02-17: 0.2 mg via INTRAVENOUS

## 2018-02-17 MED ORDER — BUPIVACAINE HCL (PF) 0.25 % IJ SOLN
INTRAMUSCULAR | Status: DC | PRN
  Administered 2018-02-17: 20 mL

## 2018-02-17 MED ORDER — FAMOTIDINE 20 MG PO TABS
ORAL_TABLET | ORAL | Status: AC
  Administered 2018-02-17: 20 mg via ORAL
  Filled 2018-02-17: qty 1

## 2018-02-17 MED ORDER — ONDANSETRON HCL 4 MG/2ML IJ SOLN
INTRAMUSCULAR | Status: DC | PRN
  Administered 2018-02-17: 4 mg via INTRAVENOUS

## 2018-02-17 MED ORDER — CEFAZOLIN SODIUM-DEXTROSE 2-4 GM/100ML-% IV SOLN
INTRAVENOUS | Status: AC
  Filled 2018-02-17: qty 100

## 2018-02-17 MED ORDER — CHLORHEXIDINE GLUCONATE CLOTH 2 % EX PADS: 6.0000 | MEDICATED_PAD | Freq: Once | CUTANEOUS | Status: DC

## 2018-02-17 MED ORDER — MIDAZOLAM HCL 2 MG/2ML IJ SOLN
INTRAMUSCULAR | Status: AC
  Filled 2018-02-17: qty 2

## 2018-02-17 MED ORDER — HYDROCODONE-ACETAMINOPHEN 5-325 MG PO TABS
1.0000 | ORAL_TABLET | Freq: Once | ORAL | Status: AC
  Administered 2018-02-17: 1 via ORAL

## 2018-02-17 MED ORDER — OXYCODONE HCL 5 MG PO TABS: 5.0000 mg | ORAL_TABLET | ORAL | 0 refills | Status: DC | PRN

## 2018-02-17 MED ORDER — MIDAZOLAM HCL 2 MG/2ML IJ SOLN
INTRAMUSCULAR | Status: DC | PRN
  Administered 2018-02-17: 2 mg via INTRAVENOUS

## 2018-02-17 MED ORDER — EPHEDRINE SULFATE 50 MG/ML IJ SOLN
INTRAMUSCULAR | Status: DC | PRN
  Administered 2018-02-17: 10 mg via INTRAVENOUS
  Administered 2018-02-17 (×2): 5 mg via INTRAVENOUS

## 2018-02-17 MED ORDER — ROCURONIUM BROMIDE 50 MG/5ML IV SOLN
INTRAVENOUS | Status: AC
  Filled 2018-02-17: qty 1

## 2018-02-17 MED ORDER — LIDOCAINE HCL (CARDIAC) PF 100 MG/5ML IV SOSY
PREFILLED_SYRINGE | INTRAVENOUS | Status: DC | PRN
  Administered 2018-02-17: 80 mg via INTRAVENOUS

## 2018-02-17 MED ORDER — DEXAMETHASONE SODIUM PHOSPHATE 10 MG/ML IJ SOLN
INTRAMUSCULAR | Status: AC
  Filled 2018-02-17: qty 1

## 2018-02-17 MED ORDER — FAMOTIDINE 20 MG PO TABS
20.0000 mg | ORAL_TABLET | Freq: Once | ORAL | Status: AC
  Administered 2018-02-17: 20 mg via ORAL

## 2018-02-17 MED ORDER — LACTATED RINGERS IV SOLN
INTRAVENOUS | Status: DC
  Administered 2018-02-17 (×2): via INTRAVENOUS

## 2018-02-17 MED ORDER — BUPIVACAINE HCL (PF) 0.25 % IJ SOLN
INTRAMUSCULAR | Status: AC
  Filled 2018-02-17: qty 30

## 2018-02-17 MED ORDER — PROPOFOL 10 MG/ML IV BOLUS
INTRAVENOUS | Status: DC | PRN
  Administered 2018-02-17: 150 mg via INTRAVENOUS

## 2018-02-17 MED ORDER — LIDOCAINE HCL (PF) 2 % IJ SOLN
INTRAMUSCULAR | Status: AC
  Filled 2018-02-17: qty 10

## 2018-02-17 MED ORDER — MEPERIDINE HCL 50 MG/ML IJ SOLN: 6.2500 mg | INTRAMUSCULAR | Status: DC | PRN

## 2018-02-17 MED ORDER — ONDANSETRON HCL 4 MG/2ML IJ SOLN
INTRAMUSCULAR | Status: AC
  Filled 2018-02-17: qty 2

## 2018-02-17 MED ORDER — ACETAMINOPHEN 500 MG PO TABS
ORAL_TABLET | ORAL | Status: AC
  Administered 2018-02-17: 1000 mg via ORAL
  Filled 2018-02-17: qty 2

## 2018-02-17 MED ORDER — ESMOLOL HCL 100 MG/10ML IV SOLN
INTRAVENOUS | Status: AC
  Filled 2018-02-17: qty 10

## 2018-02-17 MED ORDER — HYDROCODONE-ACETAMINOPHEN 5-325 MG PO TABS
ORAL_TABLET | ORAL | Status: AC
  Filled 2018-02-17: qty 1

## 2018-02-17 MED ORDER — PROPOFOL 10 MG/ML IV BOLUS
INTRAVENOUS | Status: AC
  Filled 2018-02-17: qty 20

## 2018-02-17 MED ORDER — GABAPENTIN 300 MG PO CAPS
ORAL_CAPSULE | ORAL | Status: AC
  Administered 2018-02-17: 300 mg via ORAL
  Filled 2018-02-17: qty 1

## 2018-02-17 MED ORDER — PHENYLEPHRINE HCL 10 MG/ML IJ SOLN
INTRAMUSCULAR | Status: AC
  Filled 2018-02-17: qty 1

## 2018-02-17 MED ORDER — CHLORHEXIDINE GLUCONATE CLOTH 2 % EX PADS
6.0000 | MEDICATED_PAD | Freq: Once | CUTANEOUS | Status: AC
  Administered 2018-02-17: 6 via TOPICAL

## 2018-02-17 MED ORDER — ROCURONIUM BROMIDE 100 MG/10ML IV SOLN
INTRAVENOUS | Status: DC | PRN
  Administered 2018-02-17 (×2): 10 mg via INTRAVENOUS
  Administered 2018-02-17: 50 mg via INTRAVENOUS
  Administered 2018-02-17: 10 mg via INTRAVENOUS
  Administered 2018-02-17: 20 mg via INTRAVENOUS
  Administered 2018-02-17: 10 mg via INTRAVENOUS

## 2018-02-17 MED FILL — IBUPROFEN 600 MG TABLET: 600 | 10 days supply | Qty: 30 | Fill #0

## 2018-02-17 SURGICAL SUPPLY — 37 items
ADH SKN CLS APL DERMABOND .7 (GAUZE/BANDAGES/DRESSINGS) ×1
BLADE SURG 15 STRL LF DISP TIS (BLADE) ×1 IMPLANT
BLADE SURG 15 STRL SS (BLADE) ×3
CANISTER SUCT 1200ML W/VALVE (MISCELLANEOUS) ×3 IMPLANT
CHLORAPREP W/TINT 26ML (MISCELLANEOUS) ×3 IMPLANT
COVER WAND RF STERILE (DRAPES) ×3 IMPLANT
DERMABOND ADVANCED (GAUZE/BANDAGES/DRESSINGS) ×2
DERMABOND ADVANCED .7 DNX12 (GAUZE/BANDAGES/DRESSINGS) ×1 IMPLANT
DRAIN PENROSE 1/4X12 LTX (DRAIN) ×3 IMPLANT
DRAPE LAPAROTOMY 100X77 ABD (DRAPES) ×3 IMPLANT
ELECT CAUTERY BLADE 6.4 (BLADE) ×3 IMPLANT
ELECT REM PT RETURN 9FT ADLT (ELECTROSURGICAL) ×3
ELECTRODE REM PT RTRN 9FT ADLT (ELECTROSURGICAL) ×1 IMPLANT
GLOVE SURG SYN 7.0 (GLOVE) ×3 IMPLANT
GLOVE SURG SYN 7.0 PF PI (GLOVE) ×1 IMPLANT
GLOVE SURG SYN 7.5  E (GLOVE) ×2
GLOVE SURG SYN 7.5 E (GLOVE) ×1 IMPLANT
GLOVE SURG SYN 7.5 PF PI (GLOVE) ×1 IMPLANT
GOWN STRL REUS W/ TWL LRG LVL3 (GOWN DISPOSABLE) ×2 IMPLANT
GOWN STRL REUS W/TWL LRG LVL3 (GOWN DISPOSABLE) ×6
LABEL OR SOLS (LABEL) ×3 IMPLANT
MESH MARLEX PLUG MEDIUM (Mesh General) ×3 IMPLANT
NEEDLE HYPO 22GX1.5 SAFETY (NEEDLE) ×3 IMPLANT
NS IRRIG 500ML POUR BTL (IV SOLUTION) ×3 IMPLANT
PACK BASIN MINOR ARMC (MISCELLANEOUS) ×3 IMPLANT
SPONGE LAP 18X18 RF (DISPOSABLE) ×3 IMPLANT
SUT MNCRL 4-0 (SUTURE) ×3
SUT MNCRL 4-0 27XMFL (SUTURE) ×1
SUT PROLENE 2 0 SH DA (SUTURE) ×6 IMPLANT
SUT SILK 2 0 SH (SUTURE) ×2 IMPLANT
SUT VIC AB 2-0 CT1 (SUTURE) ×3 IMPLANT
SUT VIC AB 3-0 SH 27 (SUTURE) ×3
SUT VIC AB 3-0 SH 27X BRD (SUTURE) ×1 IMPLANT
SUTURE MNCRL 4-0 27XMF (SUTURE) ×1 IMPLANT
SYR 10ML LL (SYRINGE) ×3 IMPLANT
SYR 30ML LL (SYRINGE) ×3 IMPLANT
SYR BULB IRRIG 60ML STRL (SYRINGE) ×3 IMPLANT

## 2018-02-17 NOTE — Interval H&P Note (Signed)
History and Physical Interval Note:  02/17/2018 9:06 AM  William Nguyen  has presented today for surgery, with the diagnosis of LEFT INGUINAL HERNIA  The various methods of treatment have been discussed with the patient and family. After consideration of risks, benefits and other options for treatment, the patient has consented to  Procedure(s): OPEN HERNIA REPAIR INGUINAL ADULT (Left) as a surgical intervention .  The patient's history has been reviewed, patient examined, no change in status, stable for surgery.  I have reviewed the patient's chart and labs.  Questions were answered to the patient's satisfaction.     Symantha Steeber

## 2018-02-17 NOTE — Anesthesia Postprocedure Evaluation (Signed)
Anesthesia Post Note  Patient: CHIEF WALKUP  Procedure(s) Performed: OPEN HERNIA REPAIR INGUINAL ADULT (Left )  Patient location during evaluation: PACU Anesthesia Type: General Level of consciousness: awake and alert and oriented Pain management: pain level controlled Vital Signs Assessment: post-procedure vital signs reviewed and stable Respiratory status: spontaneous breathing, nonlabored ventilation and respiratory function stable Cardiovascular status: blood pressure returned to baseline and stable Postop Assessment: no signs of nausea or vomiting Anesthetic complications: no     Last Vitals:  Vitals:   02/17/18 1344 02/17/18 1422  BP: 124/83 117/71  Pulse: 92 78  Resp: 18 20  Temp: 37 C   SpO2: 100% 100%    Last Pain:  Vitals:   02/17/18 1422  TempSrc:   PainSc: 5                  Arsen Mangione

## 2018-02-17 NOTE — Transfer of Care (Signed)
Immediate Anesthesia Transfer of Care Note  Patient: William Nguyen  Procedure(s) Performed: OPEN HERNIA REPAIR INGUINAL ADULT (Left )  Patient Location: PACU  Anesthesia Type:General  Level of Consciousness: drowsy, patient cooperative and responds to stimulation  Airway & Oxygen Therapy: Patient Spontanous Breathing and Patient connected to face mask oxygen  Post-op Assessment: Report given to RN and Post -op Vital signs reviewed and stable  Post vital signs: Reviewed and stable  Last Vitals:  Vitals Value Taken Time  BP 110/78 02/17/2018 12:40 PM  Temp 37.3 C 02/17/2018 12:40 PM  Pulse 84 02/17/2018 12:46 PM  Resp 16 02/17/2018 12:46 PM  SpO2 99 % 02/17/2018 12:46 PM  Vitals shown include unvalidated device data.  Last Pain:  Vitals:   02/17/18 1240  TempSrc:   PainSc: Asleep         Complications: No apparent anesthesia complications

## 2018-02-17 NOTE — Anesthesia Procedure Notes (Signed)
Procedure Name: Intubation Performed by: Kelton Pillar, CRNA Pre-anesthesia Checklist: Patient identified, Emergency Drugs available, Suction available and Patient being monitored Patient Re-evaluated:Patient Re-evaluated prior to induction Oxygen Delivery Method: Circle system utilized Preoxygenation: Pre-oxygenation with 100% oxygen Induction Type: IV induction Ventilation: Mask ventilation without difficulty Laryngoscope Size: Mac and 3 Grade View: Grade I Tube type: Oral Tube size: 7.0 mm Number of attempts: 1 Airway Equipment and Method: Stylet Placement Confirmation: positive ETCO2,  ETT inserted through vocal cords under direct vision,  CO2 detector and breath sounds checked- equal and bilateral Secured at: 21 cm Tube secured with: Tape Dental Injury: Teeth and Oropharynx as per pre-operative assessment

## 2018-02-17 NOTE — Discharge Instructions (Signed)
Open Hernia Repair, Adult, Care After These instructions give you information about caring for yourself after your procedure. Your doctor may also give you more specific instructions. If you have problems or questions, contact your doctor. Follow these instructions at home: Surgical cut (incision) care   Follow instructions from your doctor about how to take care of your surgical cut area. Make sure you: ? Wash your hands with soap and water before you change your bandage (dressing). If you cannot use soap and water, use hand sanitizer. ? Change your bandage as told by your doctor. ? Leave stitches (sutures), skin glue, or skin tape (adhesive) strips in place. They may need to stay in place for 2 weeks or longer. If tape strips get loose and curl up, you may trim the loose edges. Do not remove tape strips completely unless your doctor says it is okay.  Check your surgical cut every day for signs of infection. Check for: ? More redness, swelling, or pain. ? More fluid or blood. ? Warmth. ? Pus or a bad smell. Activity  Do not drive or use heavy machinery while taking prescription pain medicine. Do not drive until your doctor says it is okay.  Until your doctor says it is okay: ? Do not lift anything that is heavier than 10 lb (4.5 kg). ? Do not play contact sports.  Return to your normal activities as told by your doctor. Ask your doctor what activities are safe. General instructions  To prevent or treat having a hard time pooping (constipation) while you are taking prescription pain medicine, your doctor may recommend that you: ? Drink enough fluid to keep your pee (urine) clear or pale yellow. ? Take over-the-counter or prescription medicines. ? Eat foods that are high in fiber, such as fresh fruits and vegetables, whole grains, and beans. ? Limit foods that are high in fat and processed sugars, such as fried and sweet foods.  Take over-the-counter and prescription medicines only as  told by your doctor.  Do not take baths, swim, or use a hot tub until your doctor says it is okay.  Keep all follow-up visits as told by your doctor. This is important. Contact a doctor if:  You develop a rash.  You have more redness, swelling, or pain around your surgical cut.  You have more fluid or blood coming from your surgical cut.  Your surgical cut feels warm to the touch.  You have pus or a bad smell coming from your surgical cut.  You have a fever or chills.  You have blood in your poop (stool).  You have not pooped in 2-3 days.  Medicine does not help your pain. Get help right away if:  You have chest pain or you are short of breath.  You feel light-headed.  You feel weak and dizzy (feel faint).  You have very bad pain.  You throw up (vomit) and your pain is worse. This information is not intended to replace advice given to you by your health care provider. Make sure you discuss any questions you have with your health care provider. Document Released: 02/12/2014 Document Revised: 08/12/2015 Document Reviewed: 07/06/2015 Elsevier Interactive Patient Education  2019 [employer]   1) The drugs that you were given will stay in your system until tomorrow so for the next 24 hours you should not:  A) Drive an automobile B) Make any legal decisions C) Drink any alcoholic beverage   2)  You may resume regular meals tomorrow.  Today it is better to start with liquids and gradually work up to solid foods.  You may eat anything you prefer, but it is better to start with liquids, then soup and crackers, and gradually work up to solid foods.   3) Please notify your doctor immediately if you have any unusual bleeding, trouble breathing, redness and pain at the surgery site, drainage, fever, or pain not relieved by medication.    4) Additional Instructions:        Please contact your physician with any  problems or Same Day Surgery at [phone], Monday through Friday 6 am to 4 pm, or [place] at [facility] [address] number at [phone].

## 2018-02-17 NOTE — Op Note (Signed)
  Procedure Date:  02/17/2018  Pre-operative Diagnosis:  Left inguinal hernia  Post-operative Diagnosis:  Left inguinal hernia  Procedure:  Left Inguinal Hernia Repair with Mesh  Surgeon:  Melvyn Neth, MD  Anesthesia:  General endotracheal  Estimated Blood Loss:  10 ml  Specimens:  Left hernia sac and cord lipoma  Complications:  None  Findings:  Sigmoid colon inside hernia sac extending into scrotum, no injuries.  Indications for Procedure:  This is a 48 y.o. male who presents with a left inguinal hernia.  The options of surgery versus observation were reviewed with the patient and/or family. The risks of bleeding, abscess or infection, recurrence of symptoms, potential for an open procedure, injury to surrounding structures, and chronic pain were all discussed with the patient and was willing to proceed.  Description of Procedure: The patient was correctly identified in the preoperative area and brought into the operating room.  The patient was placed supine with VTE prophylaxis in place.  Appropriate time-outs were performed.  Anesthesia was induced and the patient was intubated.  Appropriate antibiotics were infused.  The left groin and lower abdomen were prepped and draped in a sterile fashion. An oblique incision was made between the pubic symphysis extending laterally toward the ASIS. Using electrocautery, the subcutaneous tissues were dissected, assuring adequate hemostasis, until reaching the external oblique aponeurosis.  A 1 cm incision was made over the aponeurosis and extended laterally and medially toward the external inguinal ring avoiding injury to the ilioinguinal nerve.  The cord was encircled using a Penrose drain and using medial and lateral retraction, the cord structures were identified and the hernia sac was identified and dissected free.  The sac was entered and freed of any contents carefully.  Sigmoid colon was noted inside and this was extending into the left  scrotum.  The sigmoid was easily reduced and then the sac was ligated with 2-0 Silk tie and resected.  There was a small cord lipoma as well which was ligated and resected at its base.  The stump of the hernia sac and cord lipoma were pushed back into the abdominal cavity.  A Bard medium plug was placed into the internal ring opening and secured in place with 2-0 Prolene.  A Bard medium mesh was then placed and sutured to the pubic tubercle, shelving edge, and conjoined tendon with interrupted 2-0 Prolene sutures.  The tails of the mesh were crossed behind the cord and sutured together creating a new internal ring.  The external oblique was then closed in running fashion with 0-0 Vicryl, creating a new external ring.  50 ml of Exparel solution was infiltrated into the facia, subcutaneous tissue, and as an ileoinguinal block near the ASIS.  The wound was irrigated and the incision was closed in two layers with interrupted 3-0 Vicryl and running 4-0 Monocryl.  The wound was cleaned and sealed with DermaBond.  The patient was emerged from anesthesia and extubated and brought to the recovery room for further management.  The patient tolerated the procedure well and all counts were correct at the end of the case.   Melvyn Neth, MD

## 2018-02-17 NOTE — Anesthesia Preprocedure Evaluation (Signed)
Anesthesia Evaluation  Patient identified by MRN, date of birth, ID band Patient awake    Reviewed: Allergy & Precautions, NPO status , Patient's Chart, lab work & pertinent test results  History of Anesthesia Complications Negative for: history of anesthetic complications  Airway Mallampati: II  TM Distance: >3 FB Neck ROM: Full    Dental no notable dental hx.    Pulmonary neg sleep apnea, neg COPD, Current Smoker,    breath sounds clear to auscultation- rhonchi (-) wheezing      Cardiovascular hypertension, Pt. on medications + CAD (nonocclusive)  (-) Past MI, (-) Cardiac Stents and (-) CABG  Rhythm:Regular Rate:Normal - Systolic murmurs and - Diastolic murmurs    Neuro/Psych neg Seizures negative neurological ROS  negative psych ROS   GI/Hepatic Neg liver ROS, GERD  ,  Endo/Other  negative endocrine ROSneg diabetes  Renal/GU negative Renal ROS     Musculoskeletal negative musculoskeletal ROS (+)   Abdominal (+) - obese,   Peds  Hematology negative hematology ROS (+)   Anesthesia Other Findings Past Medical History: 10/18/2017: Alcohol abuse     Comment:  drinks a 6 pack daily No date: Cocaine abuse (HCC) No date: Fracture of angle of right mandible, initial encounter for  closed fracture (HCC) No date: GERD (gastroesophageal reflux disease)     Comment:  h/o No date: Hypertension No date: Liver mass, right lobe     Comment:  hepatic adenoma with FNH features 03/2016: Pneumonia No date: Reducible left inguinal hernia No date: Smoking No date: Ulcer     Comment:  gastric ulcers with bleeding episode   Reproductive/Obstetrics                             Anesthesia Physical Anesthesia Plan  ASA: II  Anesthesia Plan: General   Post-op Pain Management:    Induction: Intravenous  PONV Risk Score and Plan: 0 and Ondansetron and Midazolam  Airway Management Planned: Oral  ETT  Additional Equipment:   Intra-op Plan:   Post-operative Plan: Extubation in OR  Informed Consent: I have reviewed the patients History and Physical, chart, labs and discussed the procedure including the risks, benefits and alternatives for the proposed anesthesia with the patient or authorized representative who has indicated his/her understanding and acceptance.   Dental advisory given  Plan Discussed with: CRNA and Anesthesiologist  Anesthesia Plan Comments:         Anesthesia Quick Evaluation

## 2018-02-17 NOTE — Anesthesia Post-op Follow-up Note (Signed)
Anesthesia QCDR form completed.        

## 2018-02-18 LAB — SURGICAL PATHOLOGY

## 2018-02-19 ENCOUNTER — Encounter: Payer: Self-pay | Admitting: Surgery

## 2018-03-03 ENCOUNTER — Other Ambulatory Visit: Payer: Self-pay | Admitting: Nurse Practitioner

## 2018-03-03 ENCOUNTER — Ambulatory Visit: Payer: Self-pay | Attending: Family Medicine

## 2018-03-03 DIAGNOSIS — R748 Abnormal levels of other serum enzymes: Secondary | ICD-10-CM | POA: Insufficient documentation

## 2018-03-03 DIAGNOSIS — M1A00X0 Idiopathic chronic gout, unspecified site, without tophus (tophi): Secondary | ICD-10-CM

## 2018-03-03 NOTE — Progress Notes (Signed)
u

## 2018-03-04 LAB — CMP14+EGFR
ALT: 16 IU/L (ref 0–44)
AST: 24 IU/L (ref 0–40)
Albumin/Globulin Ratio: 1.7 (ref 1.2–2.2)
Albumin: 4.5 g/dL (ref 4.0–5.0)
Alkaline Phosphatase: 252 IU/L — ABNORMAL HIGH (ref 39–117)
BUN/Creatinine Ratio: 11 (ref 9–20)
BUN: 8 mg/dL (ref 6–24)
Bilirubin Total: <0.2 mg/dL (ref 0.0–1.2)
CO2: 20 mmol/L (ref 20–29)
Calcium: 9.8 mg/dL (ref 8.7–10.2)
Chloride: 96 mmol/L (ref 96–106)
Creatinine, Ser: 0.75 mg/dL — ABNORMAL LOW (ref 0.76–1.27)
GFR calc Af Amer: 128 mL/min/{1.73_m2} (ref >59)
GFR calc non Af Amer: 111 mL/min/{1.73_m2} (ref >59)
Globulin, Total: 2.6 g/dL (ref 1.5–4.5)
Glucose: 75 mg/dL (ref 65–99)
POTASSIUM: 4.4 mmol/L (ref 3.5–5.2)
Sodium: 140 mmol/L (ref 134–144)
TOTAL PROTEIN: 7.1 g/dL (ref 6.0–8.5)

## 2018-03-04 LAB — URIC ACID: Uric Acid: 4.7 mg/dL (ref 3.7–8.6)

## 2018-03-05 ENCOUNTER — Other Ambulatory Visit: Payer: Self-pay

## 2018-03-05 ENCOUNTER — Ambulatory Visit (INDEPENDENT_AMBULATORY_CARE_PROVIDER_SITE_OTHER): Payer: Self-pay | Admitting: Surgery

## 2018-03-05 ENCOUNTER — Encounter: Payer: Self-pay | Admitting: Surgery

## 2018-03-05 VITALS — BP 142/85 | HR 86 | Temp 98.1°F | Ht 70.0 in | Wt 150.0 lb

## 2018-03-05 DIAGNOSIS — K4090 Unilateral inguinal hernia, without obstruction or gangrene, not specified as recurrent: Secondary | ICD-10-CM

## 2018-03-05 DIAGNOSIS — Z09 Encounter for follow-up examination after completed treatment for conditions other than malignant neoplasm: Secondary | ICD-10-CM

## 2018-03-05 NOTE — Patient Instructions (Addendum)
Patient is to return to the office as needed, use a jock strap to support the scrotum or brief underwear the swelling will go away on its on. Follow up with Dr.Wohl   Call the office with any questions or concerns.

## 2018-03-05 NOTE — Progress Notes (Signed)
03/05/2018  HPI: William Nguyen is a 48 y.o. male s/p left inguinal hernia repair with mesh on 02/17/18.  Patient presents for follow up.  He reports that he is doing well with only some soreness at the incision now.  He does endorse still swelling at the scrotum but otherwise no worsening pain, or any bulging sensation.  Vital signs: BP (!) 142/85   Pulse 86   Temp 98.1 F (36.7 C) (Temporal)   Ht 5\' 10"  (1.778 m)   Wt 150 lb (68 kg)   SpO2 99%   BMI 21.52 kg/m    Physical Exam: Constitutional: No acute distress Abdomen: Soft, nondistended, nontender to palpation.  Left inguinal incision is clean dry and intact and healing well with only a small area of swelling beneath the incision consistent with normal healing and scarring.  There is fluid swelling of the scrotum but is soft and not indurated with no erythema, consistent with postop fluid.  Assessment/Plan: This is a 48 y.o. male s/p left inguinal hernia repair with mesh.  -Reassured the patient that currently he is healing well and as expected.  There is no evidence of recurrence of a hernia.  Discussed with him that this will continue to go down and the fluid should be reabsorbed slowly by his body. -Suggested to the patient that he may wear a jockstrap or brief underwear to help with scrotal support and this will improve some of the swelling and the soreness that he is having. -Patient follow-up with [place] on an as-needed basis.  Did discuss with him that if the swelling has not gone down in the next month, that he should call us so that we can reevaluate him.   Melvyn Neth, [facility]

## 2018-03-07 ENCOUNTER — Ambulatory Visit: Payer: Self-pay | Admitting: Nurse Practitioner

## 2018-03-07 MED FILL — AMLODIPINE BESYLATE 5 MG TA: 5 | 30 days supply | Qty: 30 | Fill #1

## 2018-03-10 ENCOUNTER — Ambulatory Visit: Payer: Self-pay | Attending: Nurse Practitioner | Admitting: Nurse Practitioner

## 2018-03-10 ENCOUNTER — Encounter: Payer: Self-pay | Admitting: Nurse Practitioner

## 2018-03-10 VITALS — BP 124/78 | HR 93 | Temp 99.1°F | Ht 70.0 in | Wt 154.2 lb

## 2018-03-10 DIAGNOSIS — R7989 Other specified abnormal findings of blood chemistry: Secondary | ICD-10-CM

## 2018-03-10 DIAGNOSIS — I10 Essential (primary) hypertension: Secondary | ICD-10-CM

## 2018-03-10 DIAGNOSIS — M1A49X0 Other secondary chronic gout, multiple sites, without tophus (tophi): Secondary | ICD-10-CM

## 2018-03-10 DIAGNOSIS — R945 Abnormal results of liver function studies: Secondary | ICD-10-CM

## 2018-03-10 MED ORDER — ALLOPURINOL 100 MG PO TABS: 200.0000 mg | ORAL_TABLET | Freq: Every day | ORAL | 0 refills | Status: DC

## 2018-03-10 MED FILL — ?ALLOPURINOL 100MG TABLET: 100 | 15 days supply | Qty: 30 | Fill #1

## 2018-03-10 MED FILL — ?ALLOPURINOL 100MG TABLET: 100 | 30 days supply | Qty: 30 | Fill #1

## 2018-03-10 NOTE — Progress Notes (Signed)
Assessment & Plan:  William Nguyen was seen today for blood pressure check.  Diagnoses and all orders for this visit:  Essential hypertension Continue all antihypertensives as prescribed.  Remember to bring in your blood pressure log with you for your follow up appointment.  DASH/Mediterranean Diets are healthier choices for HTN.    Other secondary chronic gout of multiple sites without tophus -     allopurinol (ZYLOPRIM) 100 MG tablet; Take 2 tablets (200 mg total) by mouth daily.  Patient is compliant with allopurinol and last uric acid was normal.  He was instructed to make a lab appointment in 6 weeks for his uric acid to be retested.  He is to continue on allopurinol as prescribed. Lab Results  Component Value Date   LABURIC 4.7 03/03/2018      Patient has been counseled on age-appropriate routine health concerns for screening and prevention. These are reviewed and up-to-date. Referrals have been placed accordingly. Immunizations are up-to-date or declined.    Subjective:   Chief Complaint  Patient presents with  . Blood Pressure Check    Pt. is here for bp recheck.    HPI William Nguyen 48 y.o. male presents to office today for follow-up BP recheck.  He does not pressure at home.  He is normotensive today and endorses medication compliance taking amlodipine 5 mg daily.  He continues to smoke cigarettes however he states he is down to 1/2 ppd of cigarettes.  He is not agitated.  We discussed some of the potential risk of long-term cigarette smoking.  He does complain of some swelling below his surgical site which he states his surgeon explained to him is normal after surgery and should likely resolve in a few weeks. He is status post left inguinal open hernia repair 02-03-2018.   ESSENTIAL HYPERTENSION  BP Readings from Last 3 Encounters:  03/10/18 124/78  03/05/18 (!) 142/85  02/17/18 117/71  Chronic and well controlled. Today he denies any chest pain or shortness of breath,  lightheadedness, dizziness, headaches or BLE edema.    Elevated LFT Alk phos is persistently elevated.  He is currently being followed by gastroenterology for hepatic adenoma with next follow-up appointment for March 20.  Per review of GI notes on 01/22/2018 there was discussion of referral to hepatic surgeon for removal of greater than 5 cm hepatic adenoma.  I have contacted his gastroenterologist today to determine if he would like for me to place that referral.  Per [facility]. [facility] nurse they will see him in the office in February instead of March and will discuss the upcoming referral for surgery at that time.  Patient is aware of his new appointment time.     Review of Systems  Constitutional: Negative for fever, malaise/fatigue and weight loss.  HENT: Negative.  Negative for nosebleeds.   Eyes: Negative.  Negative for blurred vision, double vision and photophobia.  Respiratory: Negative.  Negative for cough and shortness of breath.   Cardiovascular: Negative.  Negative for chest pain, palpitations and leg swelling.  Gastrointestinal: Negative.  Negative for heartburn, nausea and vomiting.  Genitourinary:       Per patient report: Mild left groin swelling post surgery  Musculoskeletal: Negative.  Negative for myalgias.  Neurological: Negative.  Negative for dizziness, focal weakness, seizures and headaches.  Psychiatric/Behavioral: Negative.  Negative for suicidal ideas.    Past Medical History:  Diagnosis Date  . Alcohol abuse 10/18/2017   drinks a 6 pack daily  . Cocaine abuse ([place])   .  Fracture of angle of right mandible, initial encounter for closed fracture ([place])   . GERD (gastroesophageal reflux disease)    h/o  . Hypertension   . 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 Pneumonia 03/2016  . Reducible left inguinal hernia   . Smoking   . Ulcer    gastric ulcers with bleeding episode    Past Surgical History:  Procedure Laterality Date  .  CardioPulmonary Exercise Test: CPX  04/2016   Exercise testing with gas exchange shows a moderate functional limitation due to a circulatory limitation -- Peak VO2 53%.  There were no ST-T wave changes with exercise. Resting spirometry suggests mild restriction. Hypertensive response to exercise.  . hand surgery Right   . INGUINAL HERNIA REPAIR Left 02/17/2018   Procedure: OPEN HERNIA REPAIR INGUINAL ADULT;  Surgeon: Olean Ree, MD;  Location: [facility] ORS;  Service: General;  Laterality: Left;    Family History  Problem Relation Age of Onset  . Healthy Mother   . Healthy Father   . Healthy Sister   . Healthy Brother   . Heart disease Paternal Grandmother        Unsure of details - still living at age    Social History Reviewed with no changes to be made today.   Outpatient Medications Prior to Visit  Medication Sig Dispense Refill  . amLODipine (NORVASC) 5 MG tablet Take 1 tablet (5 mg total) by mouth daily. (Patient taking differently: Take 5 mg by mouth daily before lunch. ) 90 tablet 1  . ibuprofen (ADVIL,MOTRIN) 600 MG tablet Take 1 tablet (600 mg total) by mouth every 8 (eight) hours as needed for fever, mild pain or moderate pain. 30 tablet 0  . allopurinol (ZYLOPRIM) 100 MG tablet Take 2 tablets (200 mg total) by mouth daily. 90 tablet 0  . Coenzyme Q10 (CO Q-10) 100 MG CAPS Take 100 mg by mouth daily.    Marland Kitchen oxyCODONE (OXY IR/ROXICODONE) 5 MG immediate release tablet Take 1 tablet (5 mg total) by mouth every 4 (four) hours as needed for severe pain. (Patient not taking: Reported on 03/10/2018) 30 tablet 0  . oxyCODONE (ROXICODONE) 5 MG immediate release tablet Take 1 tablet (5 mg total) by mouth every 4 (four) hours as needed for severe pain. (Patient not taking: Reported on 03/10/2018) 30 tablet 0   No facility-administered medications prior to visit.     Allergies  Allergen Reactions  . Vicodin [Hydrocodone-Acetaminophen] Nausea And Vomiting       Objective:    BP 124/78    Pulse 93   Temp 99.1 F (37.3 C) (Oral)   Ht _0  (1.778 m)   Wt 154 lb 3.2 oz (69.9 kg)   SpO2 95%   BMI 22.13 kg/m  Wt Readings from Last 3 Encounters:  03/10/18 154 lb 3.2 oz (69.9 kg)  03/05/18 150 lb (68 kg)  02/03/18 148 lb 6.4 oz (67.3 kg)    Physical Exam Vitals signs and nursing note reviewed.  Constitutional:      Appearance: He is well-developed.  HENT:     Head: Normocephalic and atraumatic.  Neck:     Musculoskeletal: Normal range of motion.  Cardiovascular:     Rate and Rhythm: Normal rate and regular rhythm.     Heart sounds: Normal heart sounds. No murmur. No friction rub. No gallop.   Pulmonary:     Effort: Pulmonary effort is normal. No tachypnea or respiratory distress.  Breath sounds: Normal breath sounds. No decreased breath sounds, wheezing, rhonchi or rales.  Chest:     Chest wall: No tenderness.  Abdominal:     General: Bowel sounds are normal.     Palpations: Abdomen is soft.  Musculoskeletal: Normal range of motion.  Skin:    General: Skin is warm and dry.  Neurological:     Mental Status: He is alert and oriented to person, place, and time.     Coordination: Coordination normal.  Psychiatric:        Behavior: Behavior normal. Behavior is cooperative.        Thought Content: Thought content normal.        Judgment: Judgment normal.       Patient has been counseled extensively about nutrition and exercise as well as the importance of adherence with medications and regular follow-up. The patient was given clear instructions to go to ER or return to medical center if symptoms don't improve, worsen or new problems develop. The patient verbalized understanding.   Follow-up: Return for Make 6-week lab appointment for uric acid and make 74-monthappointment with me for follow-up HTN   ZGildardo Pounds FNP-BC [facility]GGlen Arbor NGracey  03/10/2018, 12:12 PM

## 2018-03-14 ENCOUNTER — Ambulatory Visit: Payer: Self-pay | Attending: Family Medicine

## 2018-03-21 ENCOUNTER — Ambulatory Visit (INDEPENDENT_AMBULATORY_CARE_PROVIDER_SITE_OTHER): Payer: Self-pay | Admitting: Surgery

## 2018-03-21 ENCOUNTER — Other Ambulatory Visit: Payer: Self-pay

## 2018-03-21 ENCOUNTER — Encounter: Payer: Self-pay | Admitting: Surgery

## 2018-03-21 VITALS — BP 132/85 | HR 99 | Temp 97.9°F | Resp 18 | Ht 70.0 in | Wt 151.2 lb

## 2018-03-21 DIAGNOSIS — Z09 Encounter for follow-up examination after completed treatment for conditions other than malignant neoplasm: Secondary | ICD-10-CM

## 2018-03-21 DIAGNOSIS — K4090 Unilateral inguinal hernia, without obstruction or gangrene, not specified as recurrent: Secondary | ICD-10-CM

## 2018-03-21 NOTE — Patient Instructions (Addendum)
Your Ultrasound is scheduled for 03/26/2018 at 4pm. Please arrie at 3:45pm. Please see your follow up appointment listed below.     GENERAL POST-OPERATIVE PATIENT INSTRUCTIONS   WOUND CARE INSTRUCTIONS:  Keep a dry clean dressing on the wound if there is drainage. The initial bandage may be removed after 24 hours.  Once the wound has quit draining you may leave it open to air.  If clothing rubs against the wound or causes irritation and the wound is not draining you may cover it with a dry dressing during the daytime.  Try to keep the wound dry and avoid ointments on the wound unless directed to do so.  If the wound becomes bright red and painful or starts to drain infected material that is not clear, please contact your physician immediately.  If the wound is mildly pink and has a thick firm ridge underneath it, this is normal, and is referred to as a healing ridge.  This will resolve over the next 4-6 weeks.  BATHING: You may shower if you have been informed of this by your surgeon. However, Please do not submerge in a tub, hot tub, or pool until incisions are completely sealed or have been told by your surgeon that you may do so.  DIET:  You may eat any foods that you can tolerate.  It is a good idea to eat a high fiber diet and take in plenty of fluids to prevent constipation.  If you do become constipated you may want to take a mild laxative or take ducolax tablets on a daily basis until your bowel habits are regular.  Constipation can be very uncomfortable, along with straining, after recent surgery.  ACTIVITY:  You are encouraged to cough and deep breath or use your incentive spirometer if you were given one, every 15-30 minutes when awake.  This will help prevent respiratory complications and low grade fevers post-operatively if you had a general anesthetic.  You may want to hug a pillow when coughing and sneezing to add additional support to the surgical area, if you had abdominal or chest  surgery, which will decrease pain during these times.  You are encouraged to walk and engage in light activity for the next two weeks.  You should not lift more than 20 pounds, until 2/24/2020as it could put you at increased risk for complications.  Twenty pounds is roughly equivalent to a plastic bag of groceries. At that time- Listen to your body when lifting, if you have pain when lifting, stop and then try again in a few days. Soreness after doing exercises or activities of daily living is normal as you get back in to your normal routine.  MEDICATIONS:  Try to take narcotic medications and anti-inflammatory medications, such as tylenol, ibuprofen, naprosyn, etc., with food.  This will minimize stomach upset from the medication.  Should you develop nausea and vomiting from the pain medication, or develop a rash, please discontinue the medication and contact your physician.  You should not drive, make important decisions, or operate machinery when taking narcotic pain medication.  SUNBLOCK Use sun block to incision area over the next year if this area will be exposed to sun. This helps decrease scarring and will allow you avoid a permanent darkened area over your incision.  QUESTIONS:  Please feel free to call our office if you have any questions, and we will be glad to assist you. [phone]

## 2018-03-21 NOTE — Progress Notes (Unsigned)
[place] groin

## 2018-03-21 NOTE — Progress Notes (Signed)
03/21/2018  HPI: William Nguyen is a 48 y.o. male s/p open left inguinal hernia repair on/13/20.  He was seen on 1/29 where it was felt that he had fluid swelling on the left groin.  However he reports that this is getting worse and persistent and sometimes is large and sometimes is not.  He reports that is sore.  There is no sharp pain.  No nausea or vomiting or issues with bowel movements.  Vital signs: BP 132/85   Pulse 99   Temp 97.9 F (36.6 C) (Temporal)   Resp 18   Ht 5\' 10"  (1.778 m)   Wt 151 lb 3.2 oz (68.6 kg)   SpO2 98%   BMI 21.69 kg/m    Physical Exam: Constitutional: No acute distress Abdomen: Soft, nondistended, nontender to palpation in the abdomen.  In the left groin there is an area of swelling radiating from the distal portion of the incision towards the left scrotum.  This area is mobile and I cannot determine if this is a hernia recurrence that I cannot reduce versus an area of fluid.  I am able to palpate both testicles and they are not involved in this process.  Assessment/Plan: This is a 48 y.o. male s/p left inguinal hernia repair.  - At this point I am more worried that this could potentially represent a recurrence of a hernia that could have happened at the very corner where the mesh attaches to the pubic tubercle.  If this is the case then he would require surgery to repair his hernia again.  First we need to start with obtaining an ultrasound of the left groin to determine whether this is truly a hernia recurrence versus fluid collection.  Then based on these findings I will call the patient to inform him and determine whether he needs surgery or not.  Patient is in agreement with this plan and all of his questions have been answered.   Melvyn Neth, [facility]

## 2018-03-26 ENCOUNTER — Ambulatory Visit
Admission: RE | Admit: 2018-03-26 | Discharge: 2018-03-26 | Disposition: A | Discharge status: Home / Self Care | Payer: Self-pay | Source: Ambulatory Visit | Attending: Surgery | Admitting: Surgery

## 2018-03-26 DIAGNOSIS — K4090 Unilateral inguinal hernia, without obstruction or gangrene, not specified as recurrent: Secondary | ICD-10-CM

## 2018-03-28 ENCOUNTER — Telehealth: Payer: Self-pay

## 2018-03-28 ENCOUNTER — Other Ambulatory Visit: Payer: Self-pay

## 2018-03-28 ENCOUNTER — Ambulatory Visit: Payer: Self-pay | Admitting: Surgery

## 2018-03-28 DIAGNOSIS — K4090 Unilateral inguinal hernia, without obstruction or gangrene, not specified as recurrent: Secondary | ICD-10-CM

## 2018-03-28 NOTE — Telephone Encounter (Signed)
CT scan 04/01/2018 @ 10 am [place].  Patient to pick up prep  And instructions today or Monday.  Nothing to eat /drink 4 hours prior.  Patient notified and he stated he will pick up prep Monday at outpatient Imaging.  Follow up with Dr.OPiscoya 04/02/2018.

## 2018-04-01 ENCOUNTER — Encounter: Payer: Self-pay | Admitting: Gastroenterology

## 2018-04-01 ENCOUNTER — Ambulatory Visit (INDEPENDENT_AMBULATORY_CARE_PROVIDER_SITE_OTHER): Payer: Self-pay | Admitting: Gastroenterology

## 2018-04-01 ENCOUNTER — Ambulatory Visit
Admission: RE | Admit: 2018-04-01 | Discharge: 2018-04-01 | Disposition: A | Discharge status: Home / Self Care | Payer: Self-pay | Source: Ambulatory Visit | Attending: Surgery | Admitting: Surgery

## 2018-04-01 VITALS — BP 135/82 | HR 97 | Ht 70.0 in | Wt 154.2 lb

## 2018-04-01 DIAGNOSIS — D134 Benign neoplasm of liver: Secondary | ICD-10-CM

## 2018-04-01 DIAGNOSIS — K4090 Unilateral inguinal hernia, without obstruction or gangrene, not specified as recurrent: Secondary | ICD-10-CM | POA: Insufficient documentation

## 2018-04-01 MED ORDER — IOHEXOL 300 MG/ML  SOLN
100.0000 mL | Freq: Once | INTRAMUSCULAR | Status: AC | PRN
  Administered 2018-04-01: 100 mL via INTRAVENOUS

## 2018-04-01 NOTE — Progress Notes (Signed)
Primary Care Physician: Gildardo Pounds, NP  Primary Gastroenterologist:  Dr. Lucilla Lame  Chief Complaint  Patient presents with  . Follow up hepatic adenoma     HPI: William Nguyen is a 48 y.o. male here for follow-up after seeing me in the past for a liver lesion.  The patient was also seen me prior to having a hernia repair.  The patient was followed by surgery and had a hernia repair.  I have now asked the patient to come back and see me because of the liver lesion.  The lesion is thought to be a hepatic adenoma.  The patient was having his hernia repair which he states was repaired and then recurred.  Current Outpatient Medications  Medication Sig Dispense Refill  . allopurinol (ZYLOPRIM) 100 MG tablet Take 2 tablets (200 mg total) by mouth daily. 90 tablet 0  . amLODipine (NORVASC) 5 MG tablet Take 1 tablet (5 mg total) by mouth daily. (Patient taking differently: Take 5 mg by mouth daily before lunch. ) 90 tablet 1  . Coenzyme Q10 (CO Q-10) 100 MG CAPS Take 100 mg by mouth daily.    Marland Kitchen ibuprofen (ADVIL,MOTRIN) 600 MG tablet Take 1 tablet (600 mg total) by mouth every 8 (eight) hours as needed for fever, mild pain or moderate pain. 30 tablet 0  . oxyCODONE (OXY IR/ROXICODONE) 5 MG immediate release tablet Take 1 tablet (5 mg total) by mouth every 4 (four) hours as needed for severe pain. 30 tablet 0  . oxyCODONE (ROXICODONE) 5 MG immediate release tablet Take 1 tablet (5 mg total) by mouth every 4 (four) hours as needed for severe pain. 30 tablet 0   No current facility-administered medications for this visit.     Allergies as of 04/01/2018 - Review Complete 04/01/2018  Allergen Reaction Noted  . Vicodin [hydrocodone-acetaminophen] Nausea And Vomiting 01/22/2018    ROS:  General: Negative for anorexia, weight loss, fever, chills, fatigue, weakness. ENT: Negative for hoarseness, difficulty swallowing , nasal congestion. CV: Negative for chest pain, angina, palpitations,  dyspnea on exertion, peripheral edema.  Respiratory: Negative for dyspnea at rest, dyspnea on exertion, cough, sputum, wheezing.  GI: See history of present illness. GU:  Negative for dysuria, hematuria, urinary incontinence, urinary frequency, nocturnal urination.  Endo: Negative for unusual weight change.    Physical Examination:   BP 135/82   Pulse 97   Ht 5\' 10"  (1.778 m)   Wt 154 lb 3.2 oz (69.9 kg)   BMI 22.13 kg/m   General: Well-nourished, well-developed in no acute distress.  Eyes: No icterus. Conjunctivae pink. Extremities: No lower extremity edema. No clubbing or deformities. Neuro: Alert and oriented x 3.  Grossly intact. Skin: Warm and dry, no jaundice.   Psych: Alert and cooperative, normal mood and affect.  Labs:    Imaging Studies: [place] Soft Tissue Non Vascular  Result Date: 03/27/2018 CLINICAL DATA:  Left inguinal hernia.  PRIOR REPAIR 02/17/2018. EXAM: ULTRASOUND LEFT LOWER EXTREMITY LIMITED TECHNIQUE: Ultrasound examination of the lower extremity soft tissues was performed in the area of clinical concern. COMPARISON:  CT 12/20/2017. FINDINGS: A 8.7 x 4.3 x 6.5 cm complex mass is noted in the left inguinal canal. Given patient's history of recent surgery this could represent a hematoma. Abscess can not be excluded. Residual hernia can not be excluded. IMPRESSION: 1. 8.7 x 4.3 x 6.5 cm complex mass left inguinal canal. Given the patient's history of recent surgery this could  represent a hematoma. Abscess can not be excluded. 2.  Residual hernia can not be excluded. Electronically Signed   By: Marcello Moores  Register   On: 03/27/2018 08:49    Assessment and Plan:   William Nguyen is a 48 y.o. y/o male who has a hepatic adenoma that is 6.5 cm in the largest dimension.  The patient has been told about the risks of this turning into cancer and that it should be evaluated by a hepatic surgeon.  The patient will be sent to [facility] for evaluation and possible removal  of this hepatic adenoma.  The patient has been explained the plan and agrees with it.    Lucilla Lame, MD. Marval Regal   Note: This dictation was prepared with Dragon dictation along with smaller phrase technology. Any transcriptional errors that result from this process are unintentional.

## 2018-04-02 ENCOUNTER — Encounter: Payer: Self-pay | Admitting: Surgery

## 2018-04-02 ENCOUNTER — Ambulatory Visit (INDEPENDENT_AMBULATORY_CARE_PROVIDER_SITE_OTHER): Payer: Self-pay | Admitting: Surgery

## 2018-04-02 ENCOUNTER — Other Ambulatory Visit: Payer: Self-pay

## 2018-04-02 VITALS — BP 143/87 | HR 91 | Temp 98.6°F | Ht 70.0 in | Wt 154.0 lb

## 2018-04-02 DIAGNOSIS — Z09 Encounter for follow-up examination after completed treatment for conditions other than malignant neoplasm: Secondary | ICD-10-CM

## 2018-04-02 DIAGNOSIS — K4090 Unilateral inguinal hernia, without obstruction or gangrene, not specified as recurrent: Secondary | ICD-10-CM

## 2018-04-02 MED ORDER — OXYCODONE HCL 5 MG PO TABS: 5.0000 mg | ORAL_TABLET | ORAL | 0 refills | Status: DC | PRN

## 2018-04-02 NOTE — Progress Notes (Signed)
04/02/2018  HPI: GALVIN AVERSA is a 48 y.o. male s/p left inguinal hernia repair on 02/17/18.  He was seen on 2/14 because he was still having swelling of the left scrotum and distal groin which was uncomfortable.  Ultrasound was obtained which showed a complex mass which could be a hematoma but hernia could not be excluded.  I have independently viewed the imaging studies and agree with the findings.  Thus a CT scan was ordered.  This confirms no hernia recurrence but he does have a hematoma in the left scrotum.  Patient presents for follow up and discuss results from CT.  Vital signs: BP (!) 143/87   Pulse 91   Temp 98.6 F (37 C)   Ht 5\' 10"  (1.778 m)   Wt 154 lb (69.9 kg)   SpO2 98%   BMI 22.10 kg/m    Physical Exam: Constitutional: No acute distress Abdomen:  Soft, non-distended, non-tender to palpation.  Left groin incision is clean, dry, intact with only some mild distal swelling at the distal corner of the incision. GU:  There is swelling of the left scrotum due to mass effect from a hematoma.  There is ecchymosis of the skin of the scrotum which extends towards the left side of the penis.  This is actually smaller than how it appeared on my exam on 2/14.  Assessment/Plan: This is a 49 y.o. male s/p left inguinal hernia repair.  --Discussed with the patient that CT scan shows no hernia but a hematoma.  On exam, this has improved compared to his last visit.   --Recommended that he use a jock strap to help with scrotal support.  I think part of his discomfort is from the extra weight and gravity tugging on his groin and scrotum.  This should help.  Also recommended that he continue using Ibuprofen ~ 600 mg for pain control and will give him a new rx for oxycodone 5 mg #20 tabs for pain control, particularly for night time because he wakes up with pain when he rolls and hits the scrotum. --He will follow up in a month to check on his progress.   Melvyn Neth, [facility]

## 2018-04-02 NOTE — Patient Instructions (Addendum)
Patient will need to return to the office in 1 month. Use a jock strap for more support, to support the scrotum.   Call the office with any questions or concerns.

## 2018-04-08 MED FILL — AMLODIPINE BESYLATE 5 MG TA: 5 | 30 days supply | Qty: 30 | Fill #2

## 2018-04-08 MED FILL — ?ALLOPURINOL 100MG TABLET: 100 | 30 days supply | Qty: 60 | Fill #0

## 2018-04-21 ENCOUNTER — Other Ambulatory Visit: Payer: Self-pay

## 2018-04-21 ENCOUNTER — Ambulatory Visit: Payer: Self-pay | Attending: Family Medicine

## 2018-04-21 DIAGNOSIS — M1A00X0 Idiopathic chronic gout, unspecified site, without tophus (tophi): Secondary | ICD-10-CM

## 2018-04-22 LAB — URIC ACID: Uric Acid: 5 mg/dL (ref 3.7–8.6)

## 2018-04-23 ENCOUNTER — Other Ambulatory Visit: Payer: Self-pay | Admitting: Nurse Practitioner

## 2018-04-23 ENCOUNTER — Ambulatory Visit: Payer: Self-pay | Admitting: Gastroenterology

## 2018-04-23 DIAGNOSIS — M1A49X0 Other secondary chronic gout, multiple sites, without tophus (tophi): Secondary | ICD-10-CM

## 2018-04-23 MED ORDER — ALLOPURINOL 100 MG PO TABS: 200.0000 mg | ORAL_TABLET | Freq: Every day | ORAL | 1 refills | Status: DC

## 2018-04-23 MED ORDER — ALLOPURINOL 100 MG PO TABS: 200.0000 mg | ORAL_TABLET | Freq: Every day | ORAL | 0 refills | Status: DC

## 2018-04-30 ENCOUNTER — Encounter: Payer: Self-pay | Admitting: Surgery

## 2018-04-30 ENCOUNTER — Ambulatory Visit (INDEPENDENT_AMBULATORY_CARE_PROVIDER_SITE_OTHER): Payer: Self-pay | Admitting: Surgery

## 2018-04-30 ENCOUNTER — Other Ambulatory Visit: Payer: Self-pay

## 2018-04-30 VITALS — BP 139/88 | HR 85 | Temp 98.1°F | Ht 70.0 in | Wt 150.0 lb

## 2018-04-30 DIAGNOSIS — Z09 Encounter for follow-up examination after completed treatment for conditions other than malignant neoplasm: Secondary | ICD-10-CM

## 2018-04-30 DIAGNOSIS — K4090 Unilateral inguinal hernia, without obstruction or gangrene, not specified as recurrent: Secondary | ICD-10-CM

## 2018-04-30 NOTE — Progress Notes (Signed)
04/30/2018  HPI: William Nguyen is a 48 y.o. male s/p left inguinal hernia repair on 02/17/18.  He had post-op hematoma which was evaluated by ultrasound and CT scan on 2/20 and 2/25 respectively.  His swelling was improving on his last visit on 2/26.  He presents today for follow up.  He feels the area is still swollen and has some mild discomfort from time to time.  Vital signs: BP 139/88   Pulse 85   Temp 98.1 F (36.7 C) (Skin)   Ht 5\' 10"  (1.778 m)   Wt 150 lb (68 kg)   SpO2 97%   BMI 21.52 kg/m    Physical Exam: Constitutional: No acute distress Abdomen:  Soft, non-distended, non-tender to palpation.  Left groin incision is clean, dry, intact.  The scrotal swelling from his hematoma is actually smaller, now measuring length of 3.5 inches at maximum.  The swelling before extended all the way down the scrotum, whereas now only two thirds down.  Assessment/Plan: This is a 48 y.o. male s/p left inguinal hernia repair.  --Discussed with patient that indeed the swelling is improving.  The discomfort is likely associated with irritation from the hematoma, and this will continue improving. --Gave patient a ruler so he can also measure the hematoma from time to time.  He seems worried that the swelling is not decreasing but it truly is.  --Follow up prn.   Melvyn Neth, [facility]

## 2018-04-30 NOTE — Patient Instructions (Signed)
Return as needed.The patient is aware to call back for any questions or concerns.  

## 2018-05-09 MED FILL — ?ALLOPURINOL 100MG TABLET: 100 | 15 days supply | Qty: 30 | Fill #1

## 2018-05-09 MED FILL — ?AMLODIPINE BESYLATE 5MG TA: 5 | 30 days supply | Qty: 30 | Fill #3

## 2018-06-03 MED FILL — ?AMLODIPINE BESYLATE 5MG TA: 5 | 30 days supply | Qty: 30 | Fill #4

## 2018-06-03 MED FILL — ?ALLOPURINOL 100MG TABLET: 100 | 90 days supply | Qty: 180 | Fill #0

## 2018-06-09 ENCOUNTER — Other Ambulatory Visit: Payer: Self-pay

## 2018-06-09 ENCOUNTER — Encounter: Payer: Self-pay | Admitting: Nurse Practitioner

## 2018-06-09 ENCOUNTER — Ambulatory Visit: Payer: Self-pay | Attending: Nurse Practitioner | Admitting: Nurse Practitioner

## 2018-06-09 DIAGNOSIS — R945 Abnormal results of liver function studies: Secondary | ICD-10-CM

## 2018-06-09 DIAGNOSIS — F10920 Alcohol use, unspecified with intoxication, uncomplicated: Secondary | ICD-10-CM | POA: Insufficient documentation

## 2018-06-09 DIAGNOSIS — R7989 Other specified abnormal findings of blood chemistry: Secondary | ICD-10-CM

## 2018-06-09 DIAGNOSIS — I10 Essential (primary) hypertension: Secondary | ICD-10-CM

## 2018-06-09 MED ORDER — AMLODIPINE BESYLATE 5 MG PO TABS: 5.0000 mg | ORAL_TABLET | Freq: Every day | ORAL | 1 refills | Status: DC

## 2018-06-09 NOTE — Progress Notes (Signed)
Virtual Visit via Telephone Note Due to national recommendations of social distancing due to [place] 19, telehealth visit is felt to be most appropriate for this patient at this time.  I discussed the limitations, risks, security and privacy concerns of performing an evaluation and management service by telephone and the availability of in person appointments. I also discussed with the patient that there may be a patient responsible charge related to this service. The patient expressed understanding and agreed to proceed.    I connected with William Nguyen on 06/09/18  at   9:10 AM EDT  EDT by telephone and verified that I am speaking with the correct person using two identifiers.   Consent I discussed the limitations, risks, security and privacy concerns of performing an evaluation and management service by telephone and the availability of in person appointments. I also discussed with the patient that there may be a patient responsible charge related to this service. The patient expressed understanding and agreed to proceed.   Location of Patient: Private  Residence    Location of Provider: [place] and [place] participating in Telemedicine visit: Geryl Rankins FNP-BC [place]    History of Present Illness: Telemedicine visit for: Follow up of HTN and ETOH abuse.  Essential Hypertension Chronic and well controlled. He is unable to monitor his blood pressure at home as he does not have a BP monitor. Taking amlodipine 17m daily as prescribed. Denies chest pain, shortness of breath, palpitations, lightheadedness, dizziness, headaches or BLE edema. He has not been diet compliant in regard to sodium intake. He does not exercise.  BP Readings from Last 3 Encounters:  04/30/18 139/88  04/02/18 (!) 143/87  04/01/18 135/82   ETOH Abuse States he has cut back. Only drinks on the weekends as opposed to every day. He declines AA resources today.   Alk Phos was elevated in January. He has been instructed to make an appointment for lab work to be drawn. Will also need to check for Hepatitis.     Past Medical History:  Diagnosis Date  . Alcohol abuse 10/18/2017   drinks a 6 pack daily  . [place]   . Fracture of angle of right mandible, [place]   . GERD (gastroesophageal reflux disease)    h/o  . Hypertension   . 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 Pneumonia 03/2016  . Reducible left inguinal hernia   . Smoking   . Ulcer    gastric ulcers with bleeding episode    Past Surgical History:  Procedure Laterality Date  . CardioPulmonary Exercise Test: CPX  04/2016   Exercise testing with gas exchange shows a moderate functional limitation due to a circulatory limitation -- Peak VO2 53%.  There were no ST-T wave changes with exercise. Resting spirometry suggests mild restriction. Hypertensive response to exercise.  . hand surgery Right   . INGUINAL HERNIA REPAIR Left 02/17/2018   Procedure: OPEN HERNIA REPAIR INGUINAL ADULT;  Surgeon: POlean Ree MD;  Location: [facility] ORS;  Service: General;  Laterality: Left;    Family History  Problem Relation Age of Onset  . Healthy Mother   . Healthy Father   . Healthy Sister   . Healthy Brother   . Heart disease Paternal Grandmother        Unsure of details - still living at age    Social History  Socioeconomic History  . Marital status: Single    Spouse name: Not on file  . Number of children: 2  . Years of education: Not on file  . Highest education level: Not on file  Occupational History  . Not on file  Social Needs  . Financial resource strain: Not on file  . Food insecurity:    Worry: Not on file    Inability: Not on file  . Transportation needs:    Medical: Not on file    Non-medical: Not on file  Tobacco Use  . Smoking status: Current Every Day Smoker    Packs/day: 0.50    Years: 25.00     Pack years: 12.50    Types: Cigarettes  . Smokeless tobacco: Never Used  Substance and Sexual Activity  . Alcohol use: Yes    Alcohol/week: 2.0 standard drinks    Types: 2 Cans of beer per week    Comment: heavier in past, for the past 20 years, but now 12 on weekends  . Drug use: Not Currently    Comment: + UDS FOR COCAINE IN 2103   . Sexual activity: Not Currently  Lifestyle  . Physical activity:    Days per week: Not on file    Minutes per session: Not on file  . Stress: Not on file  Relationships  . Social connections:    Talks on phone: Not on file    Gets together: Not on file    Attends religious service: Not on file    Active member of club or organization: Not on file    Attends meetings of clubs or organizations: Not on file    Relationship status: Not on file  Other Topics Concern  . Not on file  Social History Narrative  . Not on file     Observations/Objective: Awake, alert and oriented x 3   Review of Systems  Constitutional: Negative for fever, malaise/fatigue and weight loss.  HENT: Negative.  Negative for nosebleeds.   Eyes: Negative.  Negative for blurred vision, double vision and photophobia.  Respiratory: Negative.  Negative for cough and shortness of breath.   Cardiovascular: Negative.  Negative for chest pain, palpitations and leg swelling.  Gastrointestinal: Positive for heartburn. Negative for abdominal pain, blood in stool, constipation, diarrhea, melena, nausea and vomiting.  Musculoskeletal: Negative.  Negative for myalgias.  Neurological: Negative.  Negative for dizziness, focal weakness, seizures and headaches.  Psychiatric/Behavioral: Positive for substance abuse (ETOH). Negative for suicidal ideas.    Assessment and Plan: Diagnoses and all orders for this visit:  Essential hypertension -     amLODipine (NORVASC) 5 MG tablet; Take 1 tablet (5 mg total) by mouth daily. -     CMP14+EGFR -     CBC Continue all antihypertensives as  prescribed.  Remember to bring in your blood pressure log with you for your follow up appointment.  DASH/Mediterranean Diets are healthier choices for HTN.   Alcoholic intoxication without complication ([place]) -     Hepatic Function Panel  Elevated LFTs -     Hepatic Function Panel -     Hepatitis panel, acute    Follow Up Instructions Return in about 2 months (around 08/09/2018) for HTN.     I discussed the assessment and treatment plan with the patient. The patient was provided an opportunity to ask questions and all were answered. The patient agreed with the plan and demonstrated an understanding of the instructions.   The patient was advised to  call back or seek an in-person evaluation if the symptoms worsen or if the condition fails to improve as anticipated.  I provided 24 minutes of non-face-to-face time during this encounter including median intraservice time, reviewing previous notes, labs, imaging, medications and explaining diagnosis and management.  Gildardo Pounds, FNP-BC

## 2018-06-10 ENCOUNTER — Ambulatory Visit: Payer: Self-pay | Attending: Family Medicine

## 2018-06-11 LAB — CBC
Hematocrit: 41.6 % (ref 37.5–51.0)
Hemoglobin: 15 g/dL (ref 13.0–17.7)
MCH: 33.9 pg — ABNORMAL HIGH (ref 26.6–33.0)
MCHC: 36.1 g/dL — ABNORMAL HIGH (ref 31.5–35.7)
MCV: 94 fL (ref 79–97)
Platelets: 362 10*3/uL (ref 150–450)
RBC: 4.42 x10E6/uL (ref 4.14–5.80)
RDW: 12.6 % (ref 11.6–15.4)
WBC: 6.5 10*3/uL (ref 3.4–10.8)

## 2018-06-11 LAB — HEPATIC FUNCTION PANEL: Bilirubin, Direct: 0.13 mg/dL (ref 0.00–0.40)

## 2018-06-11 LAB — HEPATITIS PANEL, ACUTE
Hep A IgM: NEGATIVE
Hep B C IgM: NEGATIVE
Hep C Virus Ab: <0.1 s/co ratio (ref 0.0–0.9)
Hepatitis B Surface Ag: NEGATIVE

## 2018-06-11 LAB — CMP14+EGFR
ALT: 72 IU/L — ABNORMAL HIGH (ref 0–44)
AST: 97 IU/L — ABNORMAL HIGH (ref 0–40)
Albumin/Globulin Ratio: 1.7 (ref 1.2–2.2)
Albumin: 4.8 g/dL (ref 4.0–5.0)
Alkaline Phosphatase: 238 IU/L — ABNORMAL HIGH (ref 39–117)
BUN/Creatinine Ratio: 8 — ABNORMAL LOW (ref 9–20)
BUN: 7 mg/dL (ref 6–24)
Bilirubin Total: 0.4 mg/dL (ref 0.0–1.2)
CO2: 23 mmol/L (ref 20–29)
Calcium: 9.9 mg/dL (ref 8.7–10.2)
Chloride: 92 mmol/L — ABNORMAL LOW (ref 96–106)
Creatinine, Ser: 0.93 mg/dL (ref 0.76–1.27)
GFR calc Af Amer: 114 mL/min/{1.73_m2} (ref >59)
GFR calc non Af Amer: 99 mL/min/{1.73_m2} (ref >59)
Globulin, Total: 2.9 g/dL (ref 1.5–4.5)
Glucose: 91 mg/dL (ref 65–99)
Potassium: 5.2 mmol/L (ref 3.5–5.2)
Sodium: 136 mmol/L (ref 134–144)
Total Protein: 7.7 g/dL (ref 6.0–8.5)

## 2018-07-11 MED FILL — ?AMLODIPINE BESYLATE 5MG TA: 5 | 30 days supply | Qty: 30 | Fill #5

## 2018-07-21 ENCOUNTER — Other Ambulatory Visit: Payer: Self-pay

## 2018-07-21 ENCOUNTER — Ambulatory Visit: Payer: Self-pay | Attending: Nurse Practitioner | Admitting: Nurse Practitioner

## 2018-07-21 ENCOUNTER — Encounter: Payer: Self-pay | Admitting: Nurse Practitioner

## 2018-07-21 VITALS — BP 129/79 | HR 88 | Temp 98.7°F | Ht 70.0 in | Wt 152.0 lb

## 2018-07-21 DIAGNOSIS — E782 Mixed hyperlipidemia: Secondary | ICD-10-CM

## 2018-07-21 DIAGNOSIS — K219 Gastro-esophageal reflux disease without esophagitis: Secondary | ICD-10-CM

## 2018-07-21 DIAGNOSIS — H9192 Unspecified hearing loss, left ear: Secondary | ICD-10-CM

## 2018-07-21 DIAGNOSIS — L247 Irritant contact dermatitis due to plants, except food: Secondary | ICD-10-CM

## 2018-07-21 DIAGNOSIS — I10 Essential (primary) hypertension: Secondary | ICD-10-CM

## 2018-07-21 DIAGNOSIS — M1A49X0 Other secondary chronic gout, multiple sites, without tophus (tophi): Secondary | ICD-10-CM

## 2018-07-21 MED ORDER — OMEPRAZOLE 20 MG PO CPDR: 20.0000 mg | DELAYED_RELEASE_CAPSULE | Freq: Two times a day (BID) | ORAL | 1 refills | Status: DC

## 2018-07-21 MED ORDER — CLOBETASOL PROPIONATE 0.05 % EX CREA: 1.0000 "application " | TOPICAL_CREAM | Freq: Two times a day (BID) | CUTANEOUS | 1 refills | Status: DC

## 2018-07-21 MED FILL — CLOBETASOL PROPIONATE 0.05: 0.05 | 15 days supply | Qty: 30 | Fill #0

## 2018-07-21 MED FILL — ?OMEPRAZOLE 20 MG CAPSULE D: 20 | 30 days supply | Qty: 60 | Fill #0

## 2018-07-21 NOTE — Patient Instructions (Signed)
Food Choices for Gastroesophageal Reflux Disease, Adult When you have gastroesophageal reflux disease (GERD), the foods you eat and your eating habits are very important. Choosing the right foods can help ease your discomfort. Think about working with a nutrition specialist (dietitian) to help you make good choices. What are tips for following this plan?  Meals  Choose healthy foods that are low in fat, such as fruits, vegetables, whole grains, low-fat dairy products, and lean meat, fish, and poultry.  Eat small meals often instead of 3 large meals a day. Eat your meals slowly, and in a place where you are relaxed. Avoid bending over or lying down until 2-3 hours after eating.  Avoid eating meals 2-3 hours before bed.  Avoid drinking a lot of liquid with meals.  Cook foods using methods other than frying. Bake, grill, or broil food instead.  Avoid or limit: ? Chocolate. ? Peppermint or spearmint. ? Alcohol. ? Pepper. ? Black and decaffeinated coffee. ? Black and decaffeinated tea. ? Bubbly (carbonated) soft drinks. ? Caffeinated energy drinks and soft drinks.  Limit high-fat foods such as: ? Fatty meat or fried foods. ? Whole milk, cream, butter, or ice cream. ? Nuts and nut butters. ? Pastries, donuts, and sweets made with butter or shortening.  Avoid foods that cause symptoms. These foods may be different for everyone. Common foods that cause symptoms include: ? Tomatoes. ? Oranges, lemons, and limes. ? Peppers. ? Spicy food. ? Onions and garlic. ? Vinegar. Lifestyle  Maintain a healthy weight. Ask your doctor what weight is healthy for you. If you need to lose weight, work with your doctor to do so safely.  Exercise for at least 30 minutes for 5 or more days each week, or as told by your doctor.  Wear loose-fitting clothes.  Do not smoke. If you need help quitting, ask your doctor.  Sleep with the head of your bed higher than your feet. Use a wedge under the  mattress or blocks under the bed frame to raise the head of the bed. Summary  When you have gastroesophageal reflux disease (GERD), food and lifestyle choices are very important in easing your symptoms.  Eat small meals often instead of 3 large meals a day. Eat your meals slowly, and in a place where you are relaxed.  Limit high-fat foods such as fatty meat or fried foods.  Avoid bending over or lying down until 2-3 hours after eating.  Avoid peppermint and spearmint, caffeine, alcohol, and chocolate. This information is not intended to replace advice given to you by your health care provider. Make sure you discuss any questions you have with your health care provider. Document Released: 07/24/2011 Document Revised: 02/28/2016 Document Reviewed: 02/28/2016 Elsevier Interactive Patient Education  2019 [employer]  Heartburn Heartburn is a type of pain or discomfort that can happen in the throat or chest. It is often described as a burning pain. It may also cause a bad, acid-like taste in the mouth. Heartburn may feel worse when you lie down or bend over. It may be worse at night. It may be caused by stomach contents that move back up (reflux) into the tube that connects the mouth with the stomach (esophagus). Follow these instructions at home: Eating and drinking   Avoid certain foods and drinks as told by your doctor. This may include: ? Coffee and tea (with or without caffeine). ? Drinks that have alcohol. ? Energy drinks and sports drinks. ? Carbonated drinks or sodas. ? Chocolate  and cocoa. ? Peppermint and mint flavorings. ? Garlic and onions. ? Horseradish. ? Spicy and acidic foods, such as:  Peppers.  Chili powder and curry powder.  Vinegar.  Hot sauces and BBQ sauce. ? Citrus fruit juices and citrus fruits, such as:  Oranges.  Lemons.  Limes. ? Tomato-based foods, such as:  Red sauce and pizza with red sauce.  Chili.  Salsa. ? Fried and fatty foods,  such as:  Donuts.  [place] fries and potato chips.  High-fat dressings. ? High-fat meats, such as:  Hot dogs and sausage.  Rib eye steak.  Ham and bacon. ? High-fat dairy items, such as:  Whole milk.  Butter.  Cream cheese.  Eat small meals often. Avoid eating large meals.  Avoid drinking large amounts of liquid with your meals.  Avoid eating meals during the 2-3 hours before bedtime.  Avoid lying down right after you eat.  Do not exercise right after you eat. Lifestyle      If you are overweight, lose an amount of weight that is healthy for you. Ask your doctor about a safe weight loss goal.  Do not use any products that contain nicotine or tobacco, including cigarettes, e-cigarettes, and chewing tobacco. These can make your symptoms worse. If you need help quitting, ask your doctor.  Wear loose clothes. Do not wear anything tight around your waist.  Raise (elevate) the head of your bed about 6 inches (15 cm) when you sleep.  Try to lower your stress. If you need help doing this, ask your doctor. General instructions  Pay attention to any changes in your symptoms.  Take over-the-counter and prescription medicines only as told by your doctor. ? Do not take aspirin, ibuprofen, or other NSAIDs unless your doctor says it is okay. ? Stop medicines only as told by your doctor.  Keep all follow-up visits as told by your doctor. This is important. Contact a doctor if:  You have new symptoms.  You lose weight and you do not know why it is happening.  You have trouble swallowing, or it hurts to swallow.  You have wheezing or a cough that keeps happening.  Your symptoms do not get better with treatment.  You have heartburn often for more than 2 weeks. Get help right away if:  You have pain in your arms, neck, jaw, teeth, or back.  You feel sweaty, dizzy, or light-headed.  You have chest pain or shortness of breath.  You throw up (vomit) and your throw  up looks like blood or coffee grounds.  Your poop (stool) is bloody or black. These symptoms may represent a serious problem that is an emergency. Do not wait to see if the symptoms will go away. Get medical help right away. Call your local emergency services (911 in the [place] Do not drive yourself to the hospital. Summary  Heartburn is a type of pain that can happen in the throat or chest. It can feel like a burning pain. It may also cause a bad, acid-like taste in the mouth.  You may need to avoid certain foods and drinks to help your symptoms. Ask your doctor what foods and drinks you should avoid.  Take over-the-counter and prescription medicines only as told by your doctor. Do not take aspirin, ibuprofen, or other NSAIDs unless your doctor told you to do so.  Contact your doctor if your symptoms do not get better or they get worse. This information is not intended to replace advice given to  you by your health care provider. Make sure you discuss any questions you have with your health care provider. Document Released: 10/04/2010 Document Revised: 06/24/2017 Document Reviewed: 06/24/2017 Elsevier Interactive Patient Education  2019 [employer].

## 2018-07-21 NOTE — Progress Notes (Signed)
Assessment & Plan:  William Nguyen was seen today for follow-up.  Diagnoses and all orders for this visit:  Essential hypertension Continue amlodipine 5 mg daily as prescribed.  Remember to bring in your blood pressure log with you for your follow up appointment.  DASH/Mediterranean Diets are healthier choices for HTN.  Encouraged to STOP SMOKING  Mixed hyperlipidemia -     Lipid panel INSTRUCTIONS: Work on a low fat, heart healthy diet and participate in regular aerobic exercise program by working out at least 150 minutes per week; 5 days a week-30 minutes per day. Avoid red meat, fried foods. junk foods, sodas, sugary drinks, unhealthy snacking, alcohol and smoking.  Drink at least 48oz of water per day and monitor your carbohydrate intake daily.  Lab Results  Component Value Date   LDLCALC 85 09/02/2017    Hearing loss of left ear, unspecified hearing loss type -     Ambulatory referral to ENT  Irritant contact dermatitis due to plants, except food -     clobetasol cream (TEMOVATE) 0.05 %; Apply 1 application topically 2 (two) times daily.  GERD without esophagitis -     omeprazole (PRILOSEC) 20 MG capsule; Take 1 capsule (20 mg total) by mouth 2 (two) times daily before a meal for 30 days. INSTRUCTIONS: Avoid GERD Triggers: acidic, spicy or fried foods, caffeine, coffee, sodas,  alcohol and chocolate.   Other secondary chronic gout of multiple sites without tophus -     Uric Acid Well controlled at this time Lab Results  Component Value Date   LABURIC 5.0 04/21/2018     Patient has been counseled on age-appropriate routine health concerns for screening and prevention. These are reviewed and up-to-date. Referrals have been placed accordingly. Immunizations are up-to-date or declined.    Subjective:   Chief Complaint  Patient presents with   Follow-up    Pt. is here for BP and labs. Pt. stated he think he have posion ivy for 4 weeks. Pt. stated he have real bad heart burn at  night.    HPI William Nguyen 48 y.o. male presents to office today   has a past medical history of Alcohol abuse (10/18/2017), Cocaine abuse ([place]), Fracture of angle of right mandible, initial encounter for closed fracture [facility]), GERD (gastroesophageal reflux disease), Hypertension, Liver mass, right lobe, Pneumonia (03/2016), Reducible left inguinal hernia, Smoking, and Ulcer.  GERD Chronic. Has tried Pepcid in the past with no relief of symptoms.  Symptoms that seem to have worsened since his surgery include heartburn and epigastric pain which are worse at night.  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  He eats a late dinner and lies down 1 to 2 hours after eating. Worse when lying on his right side. Other symptoms associated with GERD include  belching, bilious reflux, deep pressure at base of neck, fullness after meals, heartburn, midespigastric pain and symptoms primarily relate to meals, and lying down after meals.   He denies choking on food, dysphagia, hoarseness and melena. Relieving symptoms include: self induced vomiting.    Essential Hypertension Blood pressure is well controlled on 5 mg of amlodipine daily.  He endorses medication compliance.  Unfortunately he does not have a blood pressure monitor and is unable to monitor his blood pressure at home. Denies chest pain, shortness of breath, palpitations, lightheadedness, dizziness, headaches or BLE edema.  BP Readings from Last 3 Encounters:  07/21/18 129/79  04/30/18 139/88  04/02/18 Marland Kitchen)  143/87   Dermatitis: Patient complains of a rash. Symptoms began several weeks ago. Patient describes the rash as erythematous, scattered, maculopapular, vesicles. Characteristics of rash and associated history: Is rash pruritic?  yes, Alleviating factors? Calamine lotion provides some relief, Skin exposure to Environmental exposures or allergies: YES: poison ivy  Review of  Systems  Constitutional: Negative for fever, malaise/fatigue and weight loss.  HENT: Positive for hearing loss (left ear. worked around [employer] for many years). Negative for nosebleeds.   Eyes: Negative.  Negative for blurred vision, double vision and photophobia.  Respiratory: Negative.  Negative for cough and shortness of breath.   Cardiovascular: Negative.  Negative for chest pain, palpitations and leg swelling.  Gastrointestinal: Positive for abdominal pain (mild post surgical pain), heartburn and vomiting. Negative for blood in stool, constipation, diarrhea, melena and nausea.  Genitourinary: Negative.   Musculoskeletal: Negative.  Negative for myalgias.  Skin: Positive for itching and rash.  Neurological: Negative.  Negative for dizziness, focal weakness, seizures and headaches.  Psychiatric/Behavioral: Negative.  Negative for suicidal ideas.    Past Medical History:  Diagnosis Date   Alcohol abuse 10/18/2017   drinks a 6 pack daily   Cocaine abuse ([place])    Fracture of angle of right mandible, initial encounter for closed fracture ([facility])    GERD (gastroesophageal reflux disease)    h/o   Hypertension    Liver mass, right lobe    hepatic adenoma with [place] features   Pneumonia 03/2016   Reducible left inguinal hernia    Smoking    Ulcer    gastric ulcers with bleeding episode    Past Surgical History:  Procedure Laterality Date   CardioPulmonary Exercise Test: CPX  04/2016   Exercise testing with gas exchange shows a moderate functional limitation due to a circulatory limitation -- Peak VO2 53%.  There were no ST-T wave changes with exercise. Resting spirometry suggests mild restriction. Hypertensive response to exercise.   hand surgery Right    INGUINAL HERNIA REPAIR Left 02/17/2018   Procedure: OPEN HERNIA REPAIR INGUINAL ADULT;  Surgeon: Olean Ree, MD;  Location: [facility] ORS;  Service: General;  Laterality: Left;    Family History  Problem Relation  Age of Onset   Healthy Mother    Healthy Father    Healthy Sister    Healthy Brother    Heart disease Paternal Grandmother        Unsure of details - still living at age    Social History Reviewed with no changes to be made today.   Outpatient Medications Prior to Visit  Medication Sig Dispense Refill   allopurinol (ZYLOPRIM) 100 MG tablet Take 2 tablets (200 mg total) by mouth daily. 180 tablet 1   amLODipine (NORVASC) 5 MG tablet Take 1 tablet (5 mg total) by mouth daily. 90 tablet 1   Coenzyme Q10 (CO Q-10) 100 MG CAPS Take 100 mg by mouth daily.     ibuprofen (ADVIL,MOTRIN) 600 MG tablet Take 1 tablet (600 mg total) by mouth every 8 (eight) hours as needed for fever, mild pain or moderate pain. 30 tablet 0   No facility-administered medications prior to visit.     Allergies  Allergen Reactions   Vicodin [Hydrocodone-Acetaminophen] Nausea And Vomiting       Objective:    BP 129/79 (BP Location: Right Arm, Patient Position: Sitting, Cuff Size: Normal)    Pulse 88    Temp 98.7 F (37.1 C) (Oral)    Ht 5\' 10"  (1.778  m)    Wt 152 lb (68.9 kg)    SpO2 100%    BMI 21.81 kg/m  Wt Readings from Last 3 Encounters:  07/21/18 152 lb (68.9 kg)  04/30/18 150 lb (68 kg)  04/02/18 154 lb (69.9 kg)    Physical Exam Vitals signs and nursing note reviewed.  Constitutional:      Appearance: He is well-developed.  HENT:     Head: Normocephalic and atraumatic.     Right Ear: No middle ear effusion. There is no impacted cerumen. No foreign body.     Left Ear: Tympanic membrane, ear canal and external ear normal. Decreased hearing (abnormal whisper test; patient unable to recall any words spoken due to loss of hearing) noted.  No middle ear effusion. There is no impacted cerumen. No foreign body. Tympanic membrane is not injected, erythematous or retracted. Tympanic membrane has normal mobility.  Neck:     Musculoskeletal: Normal range of motion.  Cardiovascular:     Rate and  Rhythm: Normal rate and regular rhythm.     Heart sounds: Normal heart sounds. No murmur. No friction rub. No gallop.   Pulmonary:     Effort: Pulmonary effort is normal. No tachypnea or respiratory distress.     Breath sounds: Normal breath sounds. No decreased breath sounds, wheezing, rhonchi or rales.  Chest:     Chest wall: No tenderness.  Abdominal:     General: Bowel sounds are normal.     Palpations: Abdomen is soft.     Tenderness: There is generalized abdominal tenderness.    Musculoskeletal: Normal range of motion.  Skin:    General: Skin is warm and dry.     Findings: Rash present. No abscess. Rash is vesicular. Rash is not pustular.  Neurological:     Mental Status: He is alert and oriented to person, place, and time.     Coordination: Coordination normal.  Psychiatric:        Behavior: Behavior normal. Behavior is cooperative.        Thought Content: Thought content normal.        Judgment: Judgment normal.          Patient has been counseled extensively about nutrition and exercise as well as the importance of adherence with medications and regular follow-up. The patient was given clear instructions to go to ER or return to medical center if symptoms don't improve, worsen or new problems develop. The patient verbalized understanding.   Follow-up: Return in about 3 months (around 10/21/2018) for BP recheck.   Gildardo Pounds, FNP-BC [facility] and [facility] [place], [place]   07/21/2018, 2:15 PM

## 2018-07-22 LAB — LIPID PANEL
Chol/HDL Ratio: 4.7 ratio (ref 0.0–5.0)
Cholesterol, Total: 185 mg/dL (ref 100–199)
HDL: 39 mg/dL — ABNORMAL LOW (ref >39)
LDL Calculated: 99 mg/dL (ref 0–99)
Triglycerides: 234 mg/dL — ABNORMAL HIGH (ref 0–149)
VLDL Cholesterol Cal: 47 mg/dL — ABNORMAL HIGH (ref 5–40)

## 2018-07-22 LAB — URIC ACID: Uric Acid: 5.6 mg/dL (ref 3.7–8.6)

## 2018-07-24 ENCOUNTER — Other Ambulatory Visit: Payer: Self-pay | Admitting: Nurse Practitioner

## 2018-07-24 DIAGNOSIS — I10 Essential (primary) hypertension: Secondary | ICD-10-CM

## 2018-07-24 DIAGNOSIS — M1A49X0 Other secondary chronic gout, multiple sites, without tophus (tophi): Secondary | ICD-10-CM

## 2018-07-24 MED ORDER — ALLOPURINOL 100 MG PO TABS: 200.0000 mg | ORAL_TABLET | Freq: Every day | ORAL | 1 refills | Status: AC

## 2018-08-04 ENCOUNTER — Ambulatory Visit (INDEPENDENT_AMBULATORY_CARE_PROVIDER_SITE_OTHER): Payer: Medicaid Other | Admitting: Otolaryngology

## 2018-08-04 DIAGNOSIS — H903 Sensorineural hearing loss, bilateral: Secondary | ICD-10-CM

## 2018-08-07 MED FILL — ?AMLODIPINE BESYLATE 5MG TA: 5 | 30 days supply | Qty: 30 | Fill #6

## 2018-08-29 MED FILL — ?OMEPRAZOLE 20 MG CAPSULE D: 20 | 30 days supply | Qty: 60 | Fill #1

## 2018-08-29 MED FILL — ?ALLOPURINOL 100MG TABLET: 100 | 30 days supply | Qty: 60 | Fill #1

## 2018-10-08 ENCOUNTER — Other Ambulatory Visit: Payer: Self-pay | Admitting: Nurse Practitioner

## 2018-10-08 DIAGNOSIS — K219 Gastro-esophageal reflux disease without esophagitis: Secondary | ICD-10-CM

## 2018-10-08 MED FILL — ?AMLODIPINE BESYLATE 5MG TA: 5 | 30 days supply | Qty: 30 | Fill #7

## 2018-10-08 MED FILL — ?OMEPRAZOLE 20MG CAP DR: 20 | 30 days supply | Qty: 60 | Fill #0

## 2018-10-08 MED FILL — ?ALLOPURINOL 100MG TABLET: 100 | 30 days supply | Qty: 60 | Fill #2

## 2018-11-06 MED FILL — ?AMLODIPINE BESYLATE 5MG TA: 5 | 30 days supply | Qty: 30 | Fill #8

## 2018-11-06 MED FILL — ?ALLOPURINOL 100MG TABLET: 100 | 30 days supply | Qty: 60 | Fill #3

## 2018-11-13 ENCOUNTER — Other Ambulatory Visit: Payer: Self-pay

## 2018-11-13 ENCOUNTER — Ambulatory Visit: Payer: Self-pay | Attending: Family Medicine | Admitting: Pharmacist

## 2018-11-13 ENCOUNTER — Encounter: Payer: Self-pay | Admitting: Pharmacist

## 2018-11-13 VITALS — BP 132/67 | HR 103

## 2018-11-13 DIAGNOSIS — I10 Essential (primary) hypertension: Secondary | ICD-10-CM

## 2018-11-13 DIAGNOSIS — K219 Gastro-esophageal reflux disease without esophagitis: Secondary | ICD-10-CM

## 2018-11-13 DIAGNOSIS — Z23 Encounter for immunization: Secondary | ICD-10-CM

## 2018-11-13 MED ORDER — OMEPRAZOLE 20 MG PO CPDR: 20.0000 mg | DELAYED_RELEASE_CAPSULE | Freq: Two times a day (BID) | ORAL | 2 refills | Status: DC

## 2018-11-13 MED ORDER — AMLODIPINE BESYLATE 5 MG PO TABS: 5.0000 mg | ORAL_TABLET | Freq: Every day | ORAL | 0 refills | Status: DC

## 2018-11-13 MED FILL — ?OMEPRAZOLE 20MG CAP DR: 20 | 30 days supply | Qty: 60 | Fill #0

## 2018-11-13 NOTE — Progress Notes (Signed)
Patient presents for vaccination against influenza per orders of Zelda. Consent given. Counseling provided. No contraindications exists. Vaccine administered without incident.   Of note, pt requesting refills to be sent to our pharmacy. Refills sent. I encouraged him to make an appointment to follow-up with PCP.

## 2018-12-02 ENCOUNTER — Encounter: Payer: Self-pay | Admitting: Nurse Practitioner

## 2018-12-02 ENCOUNTER — Ambulatory Visit: Payer: Self-pay | Attending: Nurse Practitioner | Admitting: Nurse Practitioner

## 2018-12-02 ENCOUNTER — Other Ambulatory Visit: Payer: Self-pay

## 2018-12-02 VITALS — BP 122/68 | HR 91 | Temp 98.3°F | Ht 70.0 in | Wt 153.0 lb

## 2018-12-02 DIAGNOSIS — I10 Essential (primary) hypertension: Secondary | ICD-10-CM

## 2018-12-02 DIAGNOSIS — M5416 Radiculopathy, lumbar region: Secondary | ICD-10-CM

## 2018-12-02 DIAGNOSIS — Z0283 Encounter for blood-alcohol and blood-drug test: Secondary | ICD-10-CM

## 2018-12-02 MED ORDER — ACETAMINOPHEN-CODEINE #3 300-30 MG PO TABS: 1.0000 | ORAL_TABLET | Freq: Two times a day (BID) | ORAL | 0 refills | Status: DC | PRN

## 2018-12-02 MED FILL — ?AMLODIPINE BESYLATE 5MG TA: 5 | 30 days supply | Qty: 30 | Fill #9

## 2018-12-02 MED FILL — ACETAMINOPHEN/COD #3 TABLET: 300-30 | 30 days supply | Qty: 60 | Fill #0

## 2018-12-02 NOTE — Progress Notes (Signed)
Assessment & Plan:  William Nguyen was seen today for follow-up.  Diagnoses and all orders for this visit:  Lumbar radiculopathy -     MR Lumbar Spine Wo Contrast; Future -     acetaminophen-codeine (TYLENOL #3) 300-30 MG tablet; Take 1 tablet by mouth every 12 (twelve) hours as needed for moderate pain or severe pain. -     DG Lumbar Spine Complete; Future STOP SMOKING  Essential hypertension -     CMP14+EGFR Continue all antihypertensives as prescribed.  Remember to bring in your blood pressure log with you for your follow up appointment.  DASH/Mediterranean Diets are healthier choices for HTN.    Encounter for drug screening -     Drug Screen 12+Alcohol+CRT, Ur -     acetaminophen-codeine (TYLENOL #3) 300-30 MG tablet; Take 1 tablet by mouth every 12 (twelve) hours as needed for moderate pain or severe pain. -     Drug Screen 10 W/Conf, Se    Patient has been counseled on age-appropriate routine health concerns for screening and prevention. These are reviewed and up-to-date. Referrals have been placed accordingly. Immunizations are up-to-date or declined.    Subjective:   Chief Complaint  Patient presents with  . Follow-up    Pt. is here for blood pressure check. Pt. is having back pain from his surgery.    HPI William Nguyen 48 y.o. male presents to office today for HTN and with complaints of back pain.    Essential Hypertension Blood pressure is well controlled today and he endorses medication compliance taking amlodipine 5 mg daily.  He does not monitor his blood pressure at home.  Does not strictly follow a DASH diet.  Weight is controlled.  He denies any chest pain, shortness of breath, lightheadedness, headaches, visual disturbances or bilateral lower extremity edema. BP Readings from Last 3 Encounters:  12/02/18 122/68  11/13/18 132/67  07/21/18 129/79    Chronic back pain He had a laparoscopic liver hepatectomy on May 22 and states back pain has been persistent since  his surgery.  He rates back pain 10 out of 10 with worsening over the past 2 months.  There is some radiation of pain from the lumbar area of the back into the bilateral lower extremities.  He denies any sciatica, bowel or bladder incontinence, neuropathy or bilateral lower extremity weakness.  Aggravating factors: Prolonged periods of sitting, standing, bending.   Review of Systems  Constitutional: Negative for fever, malaise/fatigue and weight loss.  HENT: Negative.  Negative for nosebleeds.   Eyes: Negative.  Negative for blurred vision, double vision and photophobia.  Respiratory: Negative.  Negative for cough and shortness of breath.   Cardiovascular: Negative.  Negative for chest pain, palpitations and leg swelling.  Gastrointestinal: Positive for heartburn. Negative for nausea and vomiting.  Musculoskeletal: Positive for back pain. Negative for myalgias.  Neurological: Negative.  Negative for dizziness, focal weakness, seizures and headaches.  Psychiatric/Behavioral: Positive for substance abuse (previous alcohol/cocaine abuse). Negative for suicidal ideas.    Past Medical History:  Diagnosis Date  . Alcohol abuse 10/18/2017   drinks a 6 pack daily  . Cocaine abuse ([place])   . Fracture of angle of right mandible, initial encounter for closed fracture ([place])   . GERD (gastroesophageal reflux disease)    h/o  . Hypertension   . 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 Pneumonia 03/2016  . Reducible left inguinal hernia   . Smoking   .  Ulcer    gastric ulcers with bleeding episode    Past Surgical History:  Procedure Laterality Date  . CardioPulmonary Exercise Test: CPX  04/2016   Exercise testing with gas exchange shows a moderate functional limitation due to a circulatory limitation -- Peak VO2 53%.  There were no ST-T wave changes with exercise. Resting spirometry suggests mild restriction. Hypertensive response to exercise.  . hand surgery Right   .  INGUINAL HERNIA REPAIR Left 02/17/2018   Procedure: OPEN HERNIA REPAIR INGUINAL ADULT;  Surgeon: Olean Ree, MD;  Location: [facility] ORS;  Service: General;  Laterality: Left;    Family History  Problem Relation Age of Onset  . Healthy Mother   . Healthy Father   . Healthy Sister   . Healthy Brother   . Heart disease Paternal Grandmother        Unsure of details - still living at age    Social History Reviewed with no changes to be made today.   Outpatient Medications Prior to Visit  Medication Sig Dispense Refill  . amLODipine (NORVASC) 5 MG tablet Take 1 tablet (5 mg total) by mouth daily. 90 tablet 0  . Coenzyme Q10 (CO Q-10) 100 MG CAPS Take 100 mg by mouth daily.    Marland Kitchen ibuprofen (ADVIL,MOTRIN) 600 MG tablet Take 1 tablet (600 mg total) by mouth every 8 (eight) hours as needed for fever, mild pain or moderate pain. 30 tablet 0  . omeprazole (PRILOSEC) 20 MG capsule Take 1 capsule (20 mg total) by mouth 2 (two) times daily before a meal. 60 capsule 2  . allopurinol (ZYLOPRIM) 100 MG tablet Take 2 tablets (200 mg total) by mouth daily. 180 tablet 1  . clobetasol cream (TEMOVATE) 5.36 % Apply 1 application topically 2 (two) times daily. (Patient not taking: Reported on 12/02/2018) 60 g 1   No facility-administered medications prior to visit.     Allergies  Allergen Reactions  . Vicodin [Hydrocodone-Acetaminophen] Nausea And Vomiting       Objective:    BP 122/68 (BP Location: Left Arm, Patient Position: Sitting, Cuff Size: Normal)   Pulse 91   Temp 98.3 F (36.8 C) (Oral)   Ht _0  (1.778 m)   Wt 153 lb (69.4 kg)   SpO2 99%   BMI 21.95 kg/m  Wt Readings from Last 3 Encounters:  12/02/18 153 lb (69.4 kg)  07/21/18 152 lb (68.9 kg)  04/30/18 150 lb (68 kg)    Physical Exam Vitals signs and nursing note reviewed.  Constitutional:      Appearance: He is well-developed.  HENT:     Head: Normocephalic and atraumatic.  Neck:     Musculoskeletal: Normal range of  motion.  Cardiovascular:     Rate and Rhythm: Normal rate and regular rhythm.     Heart sounds: Normal heart sounds. No murmur. No friction rub. No gallop.   Pulmonary:     Effort: Pulmonary effort is normal. No tachypnea or respiratory distress.     Breath sounds: Normal breath sounds. No decreased breath sounds, wheezing, rhonchi or rales.  Chest:     Chest wall: No tenderness.  Abdominal:     General: Bowel sounds are normal.     Palpations: Abdomen is soft.  Musculoskeletal: Normal range of motion.     Lumbar back: He exhibits tenderness. He exhibits normal range of motion, no swelling, no edema and no spasm.       Back:  Skin:    General: Skin is  warm and dry.  Neurological:     Mental Status: He is alert and oriented to person, place, and time.     Coordination: Coordination normal.  Psychiatric:        Behavior: Behavior normal. Behavior is cooperative.        Thought Content: Thought content normal.        Judgment: Judgment normal.         Patient has been counseled extensively about nutrition and exercise as well as the importance of adherence with medications and regular follow-up. The patient was given clear instructions to go to ER or return to medical center if symptoms don't improve, worsen or new problems develop. The patient verbalized understanding.   Follow-up: Return in about 3 months (around 03/04/2019).   Gildardo Pounds, FNP-BC [facility] and [facility], [place]   12/11/2018, 4:36 PM

## 2018-12-06 LAB — CMP14+EGFR
ALT: 27 IU/L (ref 0–44)
AST: 73 IU/L — ABNORMAL HIGH (ref 0–40)
Albumin/Globulin Ratio: 2 (ref 1.2–2.2)
Albumin: 5.1 g/dL — ABNORMAL HIGH (ref 4.0–5.0)
Alkaline Phosphatase: 70 IU/L (ref 39–117)
BUN/Creatinine Ratio: 17 (ref 9–20)
BUN: 13 mg/dL (ref 6–24)
Bilirubin Total: 0.6 mg/dL (ref 0.0–1.2)
CO2: 21 mmol/L (ref 20–29)
Calcium: 10.6 mg/dL — ABNORMAL HIGH (ref 8.7–10.2)
Chloride: 96 mmol/L (ref 96–106)
Creatinine, Ser: 0.78 mg/dL (ref 0.76–1.27)
GFR calc Af Amer: 125 mL/min/1.73 (ref >59)
GFR calc non Af Amer: 108 mL/min/1.73 (ref >59)
Globulin, Total: 2.6 g/dL (ref 1.5–4.5)
Glucose: 90 mg/dL (ref 65–99)
Potassium: 5 mmol/L (ref 3.5–5.2)
Sodium: 136 mmol/L (ref 134–144)
Total Protein: 7.7 g/dL (ref 6.0–8.5)

## 2018-12-06 LAB — DRUG SCREEN 10 W/CONF, SERUM
Amphetamines, IA: NEGATIVE ng/mL
Barbiturates, IA: NEGATIVE ug/mL
Benzodiazepines, IA: NEGATIVE ng/mL
Cocaine & Metabolite, IA: NEGATIVE ng/mL
Methadone, IA: NEGATIVE ng/mL
Opiates, IA: NEGATIVE ng/mL
Oxycodones, IA: NEGATIVE ng/mL
Phencyclidine, IA: NEGATIVE ng/mL
Propoxyphene, IA: NEGATIVE ng/mL
THC(Marijuana) Metabolite, IA: NEGATIVE ng/mL

## 2018-12-07 LAB — DRUG SCREEN 12+ALCOHOL+CRT, UR
Amphetamines, Urine: NEGATIVE ng/mL
BENZODIAZ UR QL: NEGATIVE ng/mL
Barbiturate: NEGATIVE ng/mL
Cannabinoids: NEGATIVE
Cocaine (Metabolite): NEGATIVE ng/mL
Creatinine, Urine: 79.8 mg/dL (ref 20.0–300.0)
Ethanol, Urine: NEGATIVE %
Meperidine: NEGATIVE ng/mL
Methadone: NEGATIVE ng/mL
OPIATE SCREEN URINE: NEGATIVE ng/mL
Oxycodone/Oxymorphone, Urine: NEGATIVE ng/mL
Phencyclidine: NEGATIVE ng/mL
Propoxyphene: NEGATIVE ng/mL
Tramadol: NEGATIVE ng/mL

## 2018-12-11 ENCOUNTER — Encounter: Payer: Self-pay | Admitting: Nurse Practitioner

## 2018-12-13 ENCOUNTER — Ambulatory Visit (HOSPITAL_COMMUNITY)
Admission: RE | Admit: 2018-12-13 | Discharge: 2018-12-13 | Disposition: A | Discharge status: Home / Self Care | Payer: Self-pay | Source: Ambulatory Visit | Attending: Nurse Practitioner | Admitting: Nurse Practitioner

## 2018-12-13 ENCOUNTER — Other Ambulatory Visit: Payer: Self-pay

## 2018-12-13 ENCOUNTER — Other Ambulatory Visit: Payer: Self-pay | Admitting: Nurse Practitioner

## 2018-12-13 DIAGNOSIS — M5416 Radiculopathy, lumbar region: Secondary | ICD-10-CM | POA: Insufficient documentation

## 2018-12-13 DIAGNOSIS — M5126 Other intervertebral disc displacement, lumbar region: Secondary | ICD-10-CM

## 2018-12-31 ENCOUNTER — Ambulatory Visit: Payer: Self-pay | Attending: Nurse Practitioner | Admitting: Physician Assistant

## 2018-12-31 ENCOUNTER — Other Ambulatory Visit: Payer: Self-pay

## 2018-12-31 DIAGNOSIS — R609 Edema, unspecified: Secondary | ICD-10-CM

## 2018-12-31 DIAGNOSIS — F1011 Alcohol abuse, in remission: Secondary | ICD-10-CM

## 2018-12-31 MED ORDER — ALBUTEROL SULFATE HFA 108 (90 BASE) MCG/ACT IN AERS: 2.0000 | INHALATION_SPRAY | Freq: Four times a day (QID) | RESPIRATORY_TRACT | 1 refills | Status: DC | PRN

## 2018-12-31 MED ORDER — FUROSEMIDE 20 MG PO TABS: 20.0000 mg | ORAL_TABLET | Freq: Every day | ORAL | 0 refills | Status: DC

## 2018-12-31 MED FILL — FUROSEMIDE 20 MG TABS: 20 | 14 days supply | Qty: 14 | Fill #0

## 2018-12-31 NOTE — Progress Notes (Signed)
Patient ID: William Nguyen, male   DOB: [birth year], 48 y.o.   MRN: [id number] Virtual Visit via Telephone Note  I connected with William Nguyen on 12/31/18 at  1:50 PM EST by telephone and verified that I am speaking with the correct person using two identifiers.   I discussed the limitations, risks, security and privacy concerns of performing an evaluation and management service by telephone and the availability of in person appointments. I also discussed with the patient that there may be a patient responsible charge related to this service. The patient expressed understanding and agreed to proceed.  Patient location:  home My Location:  [place] office Persons on the call:  Me and the patient   History of Present Illness:  C/o leg swelling for the past 2-3 weeks.  He stopped drinking alcohol 3 weeks ago.  Not doing AA or any kind of recovery;  Just stopped.  No SOB/CP/orthopnea.  Last labs about 1 month ago and kidney function was good.  Also wants to f/up with William Nguyen on MRI.      Observations/Objective:  NAD.  A&Ox3   Assessment and Plan: 1. Edema, unspecified type Likely due to alcohol cessation.  Increase water intake, elevate legs.  Stay off alcohol.  Limit sugar and salt.   - furosemide (LASIX) 20 MG tablet; Take 1 tablet (20 mg total) by mouth daily.  Dispense: 14 tablet; Refill: 0  2. Alcohol abuse, in remission I have counseled the patient at length about substance abuse and addiction.  12 step meetings/recovery recommended.  Local 12 step meeting lists were given and attendance was encouraged.  Patient expresses understanding.    Follow Up Instructions: See Pcp in 3-4 weeks   I discussed the assessment and treatment plan with the patient. The patient was provided an opportunity to ask questions and all were answered. The patient agreed with the plan and demonstrated an understanding of the instructions.   The patient was advised to call back or seek an in-person evaluation if the  symptoms worsen or if the condition fails to improve as anticipated.  I provided 12 minutes of non-face-to-face time during this encounter.   William Caldron, PA-C

## 2018-12-31 NOTE — Progress Notes (Signed)
Patient verified DOB Patient has taken medication today. Patient has not eaten today. Patient complains of bilateral feet swelling and leg swelling. Patient states the swelling has not gone down.

## 2019-01-02 IMAGING — US US SOFT TISSUE HEAD/NECK
1 series · 14 of 25 positions shown · non-contrast
Comparison: CT studies 09/16/2011

CLINICAL DATA: Right submandibular mass.

EXAM:
ULTRASOUND OF HEAD/NECK SOFT TISSUES
TECHNIQUE: Ultrasound examination of the head and neck soft tissues was
performed in the area of clinical concern.

[Series 1: us soft tissue head/neck · 0.07mm/px · 29 acquisitions, 14 frames shown]
[im 1/29]
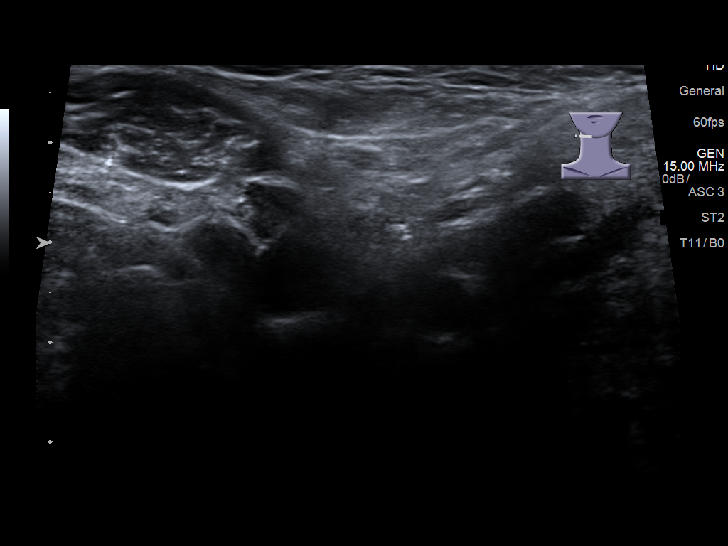
[im 3/29]
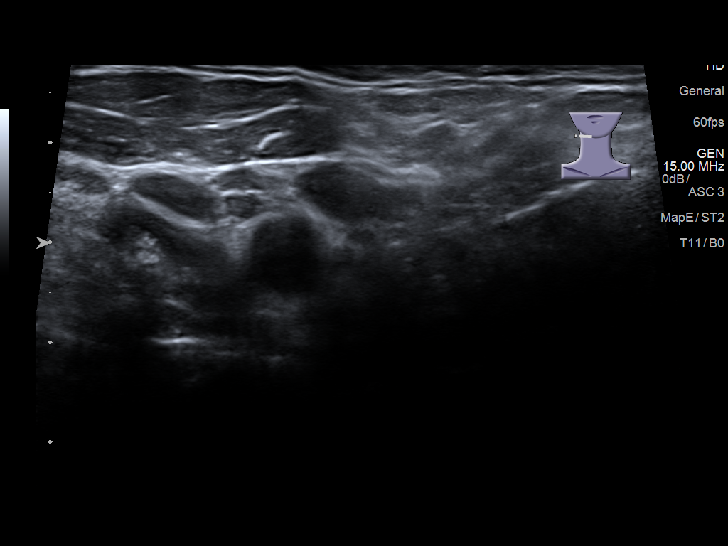
[im 5/29]
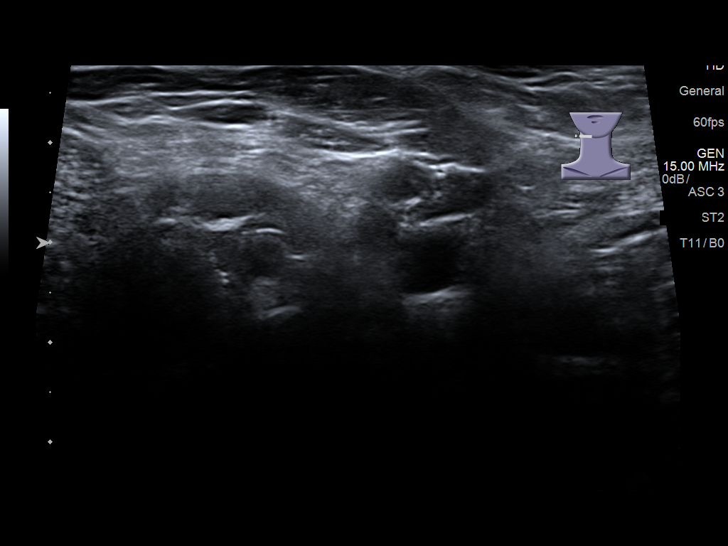
[im 8/29]
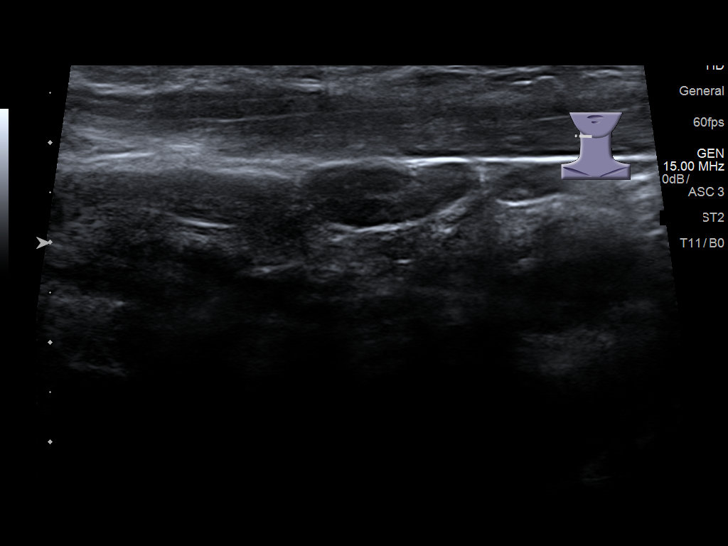
[im 10/29]
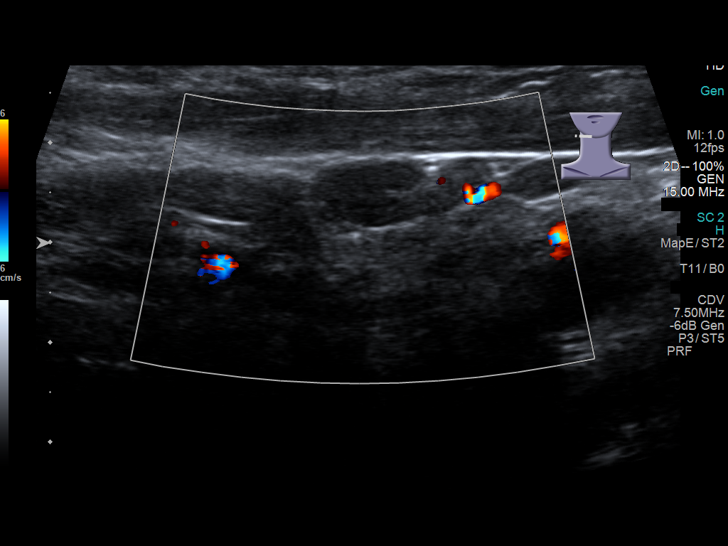
[im 11/29]
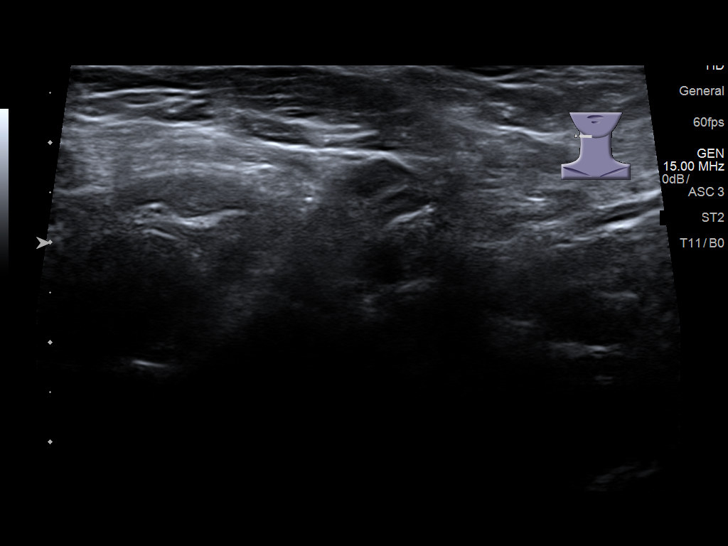
[im 13/29]
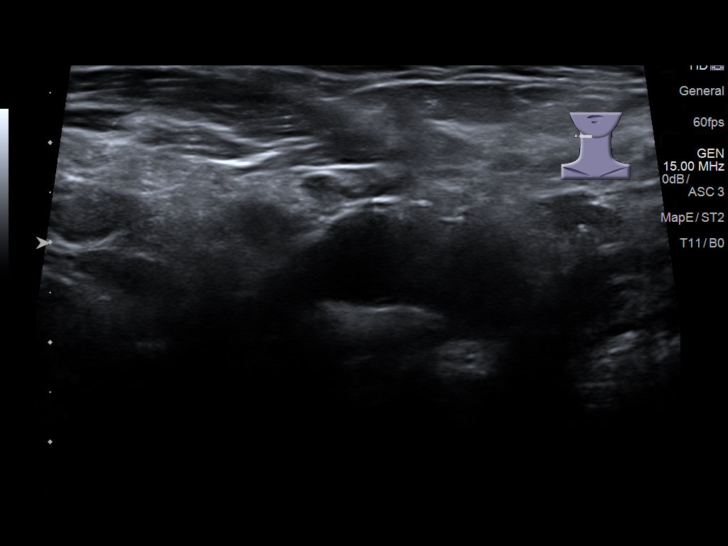
[im 16/29]
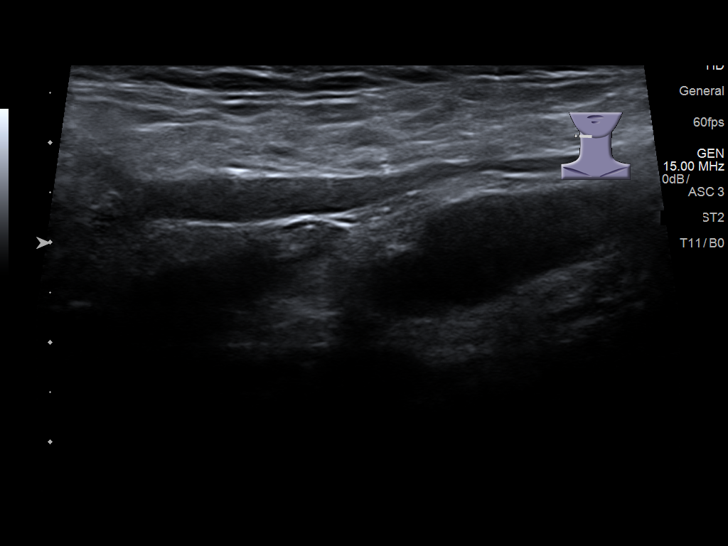
[im 18/29]
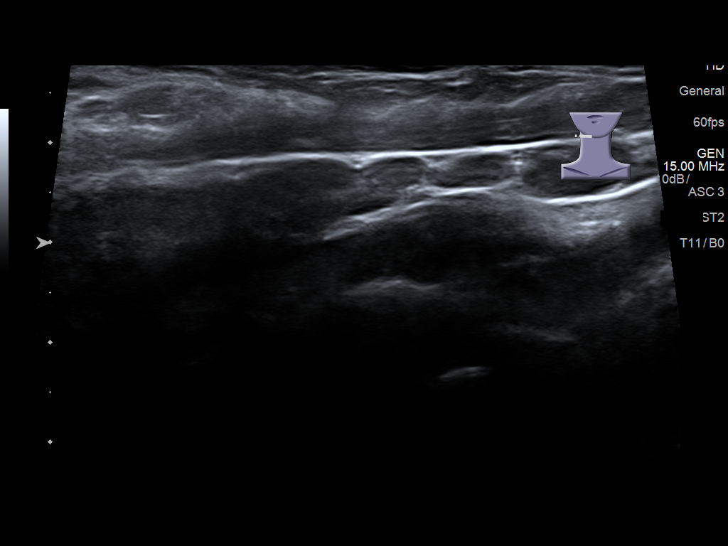
[im 19/29]
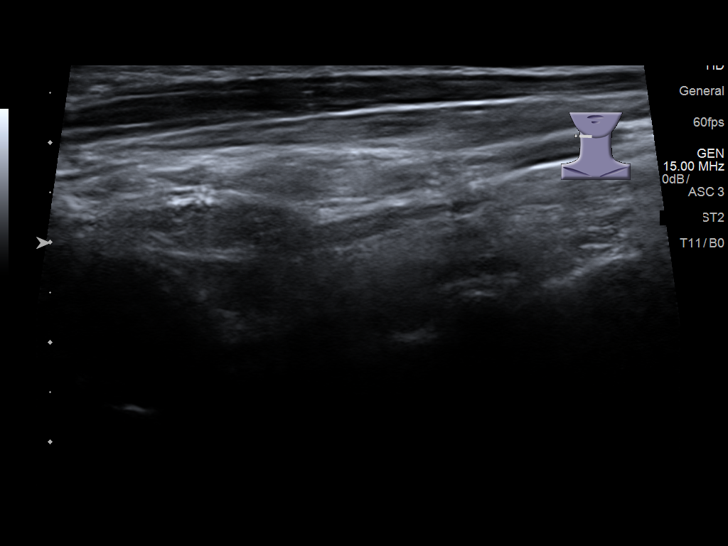
[im 22/29]
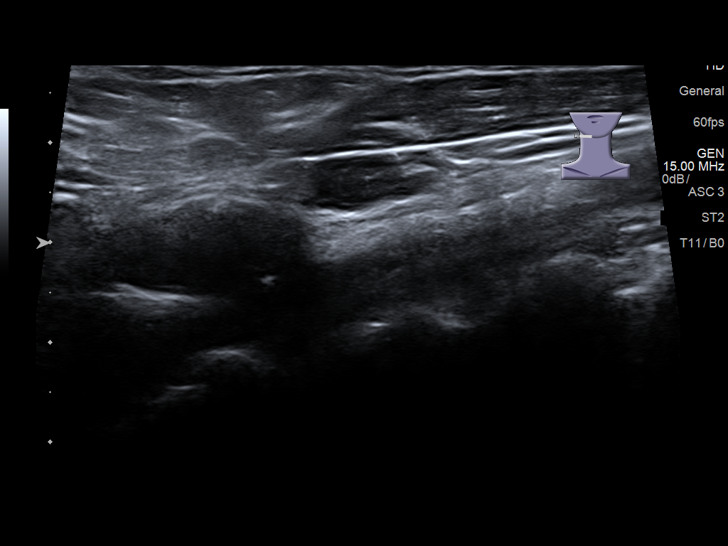
[im 24/29]
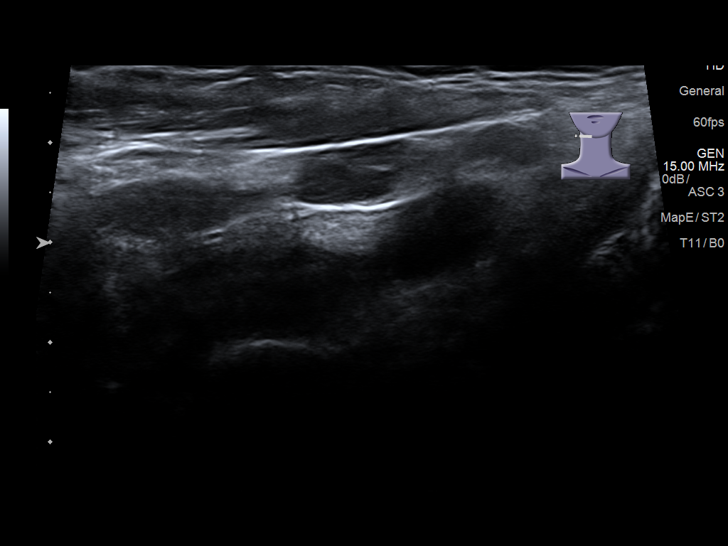
[im 26/29]
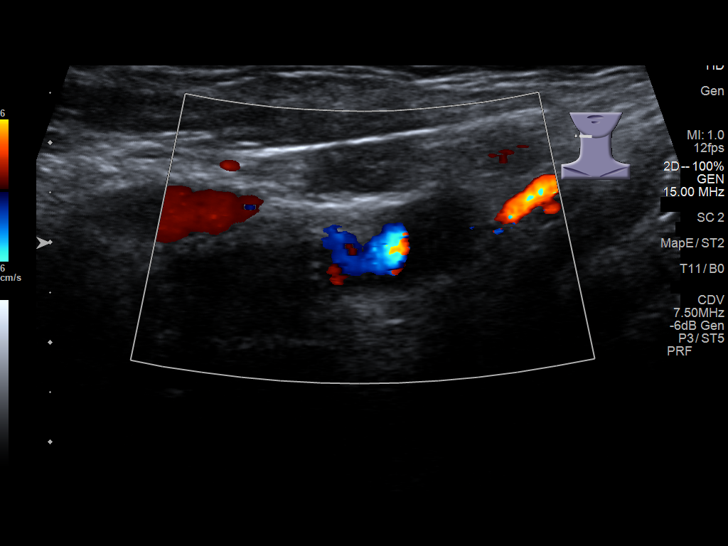
[im 29/29]
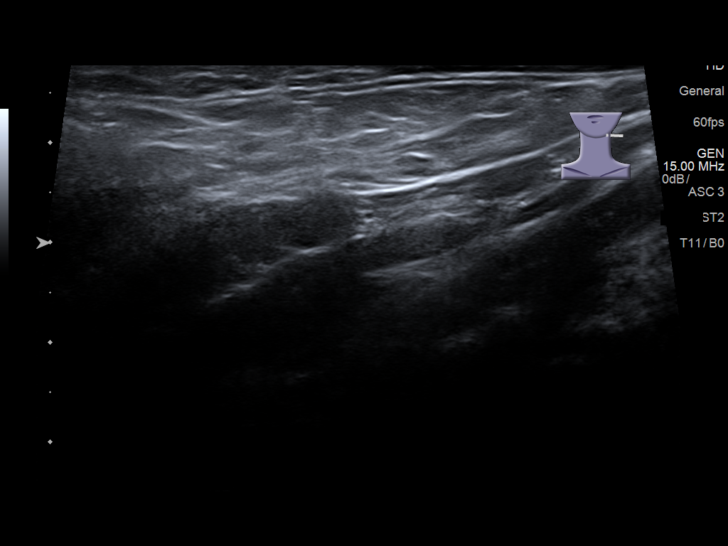

[14 of 25 positions shown; findings below may reference images not displayed]

FINDINGS: Both submandibular glands appear symmetric and normal, as they did
on the CT scan [DATE]. There are 2 upper limits of normal level 2
lymph nodes on the right demonstrated, the larger measuring 1.8 x
0.7 x 0.8 cm and the smaller measuring 1.3 x 0.5 x 1.0 cm. These are
nonspecific and as stated above are at the upper limits of normal in
size. The nodes do not show abnormal echogenicity or blood flow.
IMPRESSION: The submandibular glands appear symmetric and normal.

There are 2 identifiable level 2 lymph nodes in the right upper
neck, but short axis dimension is within the range of normal for
level 2 nodes and the morphology is not specifically worrisome.

## 2019-01-05 ENCOUNTER — Other Ambulatory Visit: Payer: Self-pay

## 2019-01-05 ENCOUNTER — Ambulatory Visit: Payer: Self-pay | Attending: Nurse Practitioner

## 2019-01-19 MED FILL — AMLODIPINE BESYLATE 5 MG TA: 5 | 30 days supply | Qty: 30 | Fill #1

## 2019-01-19 MED FILL — ?OMEPRazole 20mg CPDR: 20 | 30 days supply | Qty: 60 | Fill #1

## 2019-01-19 MED FILL — ?ALLOPURINOL 100MG TABLET: 100 | 30 days supply | Qty: 60 | Fill #0

## 2019-02-17 ENCOUNTER — Ambulatory Visit: Payer: Self-pay | Attending: Nurse Practitioner | Admitting: Nurse Practitioner

## 2019-02-17 ENCOUNTER — Encounter: Payer: Self-pay | Admitting: Nurse Practitioner

## 2019-02-17 ENCOUNTER — Other Ambulatory Visit: Payer: Self-pay

## 2019-02-17 DIAGNOSIS — D134 Benign neoplasm of liver: Secondary | ICD-10-CM

## 2019-02-17 DIAGNOSIS — Z130 Encounter for screening for diseases of the blood and blood-forming organs and certain disorders involving the immune mechanism: Secondary | ICD-10-CM

## 2019-02-17 DIAGNOSIS — R0600 Dyspnea, unspecified: Secondary | ICD-10-CM

## 2019-02-17 DIAGNOSIS — I10 Essential (primary) hypertension: Secondary | ICD-10-CM

## 2019-02-17 DIAGNOSIS — Z131 Encounter for screening for diabetes mellitus: Secondary | ICD-10-CM

## 2019-02-17 DIAGNOSIS — R0609 Other forms of dyspnea: Secondary | ICD-10-CM

## 2019-02-17 DIAGNOSIS — E785 Hyperlipidemia, unspecified: Secondary | ICD-10-CM

## 2019-02-17 DIAGNOSIS — R600 Localized edema: Secondary | ICD-10-CM

## 2019-02-17 MED ORDER — AMLODIPINE BESYLATE 5 MG PO TABS: 5.0000 mg | ORAL_TABLET | Freq: Every day | ORAL | 0 refills | Status: AC

## 2019-02-17 NOTE — Progress Notes (Signed)
Virtual Visit via Telephone Note Due to national recommendations of social distancing due to [place] 19, telehealth visit is felt to be most appropriate for this patient at this time.  I discussed the limitations, risks, security and privacy concerns of performing an evaluation and management service by telephone and the availability of in person appointments. I also discussed with the patient that there may be a patient responsible charge related to this service. The patient expressed understanding and agreed to proceed.    I connected with William Nguyen on 02/17/19  at   1:30 PM EST  EDT by telephone and verified that I am speaking with the correct person using two identifiers.   Consent I discussed the limitations, risks, security and privacy concerns of performing an evaluation and management service by telephone and the availability of in person appointments. I also discussed with the patient that there may be a patient responsible charge related to this service. The patient expressed understanding and agreed to proceed.   Location of Patient: Private Residence   Location of Provider: [place] [facility] [place] participating in Telemedicine visit: Geryl Rankins FNP-BC [place]    History of Present Illness: Telemedicine visit for: BLE edema  Edema: Patient complains of BLE edema. The location of the edema is knee(s) bilateral, lower leg(s) bilateral, ankle(s) bilateral, feet bilateral.  The edema has been mild and moderate.  Onset of symptoms was a few months ago after he stopped drinking alcohol.  Unchanged since that time. The edema is present all day.  The swelling has been aggravated by dependency of involved area and increased salt intake, relieved by diuretics, and been associated with shortness of breath. Cardiac risk factors include dyslipidemia, hypertension, male gender and smoking/ tobacco exposure. He is taking amlodipine 5 mg  however he has been taking this medication for well over a year.  He was started on Furosemide 20 mg which was effective in relieving edema. He does eat a lot of pork. Diet may be contributing to this as well. Will order labs to rule out any liver or cardiac causes.   He was referred to orthopedics a few months ago for his chronic back pain. Unfortunately there was a mix up with his FA not showing in the system. It is now updated and referral has been placed per the referral coordinator    Essential Hypertension Well controlled. Taking amlodipine 5 mg daily. Denies chest pain. Does not monitor blood pressure at home.  BP Readings from Last 3 Encounters:  12/02/18 122/68  11/13/18 132/67  07/21/18 129/79    Past Medical History:  Diagnosis Date  . Alcohol abuse 10/18/2017   drinks a 6 pack daily  . Cocaine abuse ([place])   . Fracture of angle of right mandible, initial encounter for closed fracture ([place])   . GERD (gastroesophageal reflux disease)    h/o  . Hypertension   . 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 Pneumonia 03/2016  . Reducible left inguinal hernia   . Smoking   . Ulcer    gastric ulcers with bleeding episode    Past Surgical History:  Procedure Laterality Date  . CardioPulmonary Exercise Test: CPX  04/2016   Exercise testing with gas exchange shows a moderate functional limitation due to a circulatory limitation -- Peak VO2 53%.  There were no ST-T wave changes with exercise. Resting spirometry suggests mild restriction. Hypertensive response  to exercise.  . hand surgery Right   . INGUINAL HERNIA REPAIR Left 02/17/2018   Procedure: OPEN HERNIA REPAIR INGUINAL ADULT;  Surgeon: Olean Ree, MD;  Location: [facility] ORS;  Service: General;  Laterality: Left;    Family History  Problem Relation Age of Onset  . Healthy Mother   . Healthy Father   . Healthy Sister   . Healthy Brother   . Heart disease Paternal Grandmother        Unsure of details  - still living at age    Social History   Socioeconomic History  . Marital status: Single    Spouse name: Not on file  . Number of children: 2  . Years of education: Not on file  . Highest education level: Not on file  Occupational History  . Not on file  Tobacco Use  . Smoking status: Current Every Day Smoker    Packs/day: 0.50    Years: 25.00    Pack years: 12.50    Types: Cigarettes  . Smokeless tobacco: Never Used  Substance and Sexual Activity  . Alcohol use: Not Currently    Alcohol/week: 2.0 standard drinks    Types: 2 Cans of beer per week    Comment: heavier in past, for the past 20 years, but now 12 on weekends  . Drug use: Not Currently    Comment: + UDS FOR COCAINE IN 2103   . Sexual activity: Not Currently  Other Topics Concern  . Not on file  Social History Narrative  . Not on file   Social Determinants of Health   Financial Resource Strain:   . Difficulty of Paying Living Expenses: Not on file  Food Insecurity:   . Worried About Charity fundraiser in the Last Year: Not on file  . Ran Out of Food in the Last Year: Not on file  Transportation Needs:   . Lack of Transportation (Medical): Not on file  . Lack of Transportation (Non-Medical): Not on file  Physical Activity:   . Days of Exercise per Week: Not on file  . Minutes of Exercise per Session: Not on file  Stress:   . Feeling of Stress : Not on file  Social Connections:   . Frequency of Communication with Friends and Family: Not on file  . Frequency of Social Gatherings with Friends and Family: Not on file  . Attends Religious Services: Not on file  . Active Member of Clubs or Organizations: Not on file  . Attends Archivist Meetings: Not on file  . Marital Status: Not on file     Observations/Objective: Awake, alert and oriented x 3   Review of Systems  Constitutional: Negative for fever, malaise/fatigue and weight loss.  HENT: Negative.  Negative for nosebleeds.   Eyes:  Negative.  Negative for blurred vision, double vision and photophobia.  Respiratory: Negative.  Negative for cough and shortness of breath.   Cardiovascular: Positive for leg swelling. Negative for chest pain and palpitations.  Gastrointestinal: Negative for nausea and vomiting.  Musculoskeletal: Negative.  Negative for myalgias.  Neurological: Negative.  Negative for dizziness, focal weakness, seizures and headaches.  Psychiatric/Behavioral: Positive for substance abuse. Negative for suicidal ideas.    Assessment and Plan: J[place]in was seen today for leg swelling.  Diagnoses and all orders for this visit:  Localized edema  Dyspnea on exertion -     Brain natriuretic peptide; Future  Essential hypertension -     amLODipine (NORVASC) 5 MG tablet;  Take 1 tablet (5 mg total) by mouth daily. Continue all antihypertensives as prescribed.  Remember to bring in your blood pressure log with you for your follow up appointment.  DASH/Mediterranean Diets are healthier choices for HTN.  Encourage smoking cessation   Screening for diabetes mellitus (DM) -     A1c; Future  Hepatic adenoma -     CMP14+EGFR; Future  Screening for deficiency anemia -     CBC; Future  Dyslipidemia -     Lipid Panel; Future     Follow Up Instructions Return in about 4 weeks (around 03/17/2019).     I discussed the assessment and treatment plan with the patient. The patient was provided an opportunity to ask questions and all were answered. The patient agreed with the plan and demonstrated an understanding of the instructions.   The patient was advised to call back or seek an in-person evaluation if the symptoms worsen or if the condition fails to improve as anticipated.  I provided 19 minutes of non-face-to-face time during this encounter including median intraservice time, reviewing previous notes, labs, imaging, medications and explaining diagnosis and management.  Gildardo Pounds, FNP-BC

## 2019-02-18 ENCOUNTER — Other Ambulatory Visit: Payer: Self-pay

## 2019-02-18 ENCOUNTER — Ambulatory Visit: Payer: Medicaid Other | Attending: Nurse Practitioner

## 2019-02-18 ENCOUNTER — Other Ambulatory Visit: Payer: Self-pay | Admitting: Physician Assistant

## 2019-02-18 DIAGNOSIS — Z130 Encounter for screening for diseases of the blood and blood-forming organs and certain disorders involving the immune mechanism: Secondary | ICD-10-CM

## 2019-02-18 DIAGNOSIS — R0609 Other forms of dyspnea: Secondary | ICD-10-CM

## 2019-02-18 DIAGNOSIS — R609 Edema, unspecified: Secondary | ICD-10-CM

## 2019-02-18 DIAGNOSIS — D134 Benign neoplasm of liver: Secondary | ICD-10-CM

## 2019-02-18 DIAGNOSIS — E785 Hyperlipidemia, unspecified: Secondary | ICD-10-CM

## 2019-02-18 DIAGNOSIS — Z131 Encounter for screening for diabetes mellitus: Secondary | ICD-10-CM

## 2019-02-18 MED FILL — AMLODIPINE BESYLATE 5 MG TA: 5 | 30 days supply | Qty: 30 | Fill #0

## 2019-02-18 MED FILL — ALBUTEROL SULFATE HFA 108 (: 108 (90 BAS | 25 days supply | Qty: 18 | Fill #1

## 2019-02-19 ENCOUNTER — Other Ambulatory Visit: Payer: Self-pay | Admitting: Nurse Practitioner

## 2019-02-19 DIAGNOSIS — R609 Edema, unspecified: Secondary | ICD-10-CM

## 2019-02-19 DIAGNOSIS — R7989 Other specified abnormal findings of blood chemistry: Secondary | ICD-10-CM

## 2019-02-19 DIAGNOSIS — D649 Anemia, unspecified: Secondary | ICD-10-CM

## 2019-02-19 DIAGNOSIS — R0609 Other forms of dyspnea: Secondary | ICD-10-CM

## 2019-02-19 LAB — CMP14+EGFR
ALT: 34 IU/L (ref 0–44)
AST: 67 IU/L — ABNORMAL HIGH (ref 0–40)
Albumin/Globulin Ratio: 1.6 (ref 1.2–2.2)
Albumin: 4.4 g/dL (ref 4.0–5.0)
Alkaline Phosphatase: 93 IU/L (ref 39–117)
BUN/Creatinine Ratio: 15 (ref 9–20)
BUN: 13 mg/dL (ref 6–24)
Bilirubin Total: 0.6 mg/dL (ref 0.0–1.2)
CO2: 17 mmol/L — ABNORMAL LOW (ref 20–29)
Calcium: 9.5 mg/dL (ref 8.7–10.2)
Chloride: 98 mmol/L (ref 96–106)
Creatinine, Ser: 0.84 mg/dL (ref 0.76–1.27)
GFR calc Af Amer: 121 mL/min/{1.73_m2} (ref >59)
GFR calc non Af Amer: 105 mL/min/{1.73_m2} (ref >59)
Globulin, Total: 2.7 g/dL (ref 1.5–4.5)
Glucose: 104 mg/dL — ABNORMAL HIGH (ref 65–99)
Potassium: 4.5 mmol/L (ref 3.5–5.2)
Sodium: 136 mmol/L (ref 134–144)
Total Protein: 7.1 g/dL (ref 6.0–8.5)

## 2019-02-19 LAB — HEMOGLOBIN A1C
Est. average glucose Bld gHb Est-mCnc: 94 mg/dL
Hgb A1c MFr Bld: 4.9 % (ref 4.8–5.6)

## 2019-02-19 LAB — CBC
Hematocrit: 34.9 % — ABNORMAL LOW (ref 37.5–51.0)
Hemoglobin: 11.9 g/dL — ABNORMAL LOW (ref 13.0–17.7)
MCH: 33.2 pg — ABNORMAL HIGH (ref 26.6–33.0)
MCHC: 34.1 g/dL (ref 31.5–35.7)
MCV: 98 fL — ABNORMAL HIGH (ref 79–97)
Platelets: 245 10*3/uL (ref 150–450)
RBC: 3.58 x10E6/uL — ABNORMAL LOW (ref 4.14–5.80)
RDW: 12.9 % (ref 11.6–15.4)
WBC: 7.3 10*3/uL (ref 3.4–10.8)

## 2019-02-19 LAB — LIPID PANEL
Chol/HDL Ratio: 2.8 ratio (ref 0.0–5.0)
Cholesterol, Total: 124 mg/dL (ref 100–199)
HDL: 44 mg/dL (ref >39)
LDL Chol Calc (NIH): 64 mg/dL (ref 0–99)
Triglycerides: 81 mg/dL (ref 0–149)
VLDL Cholesterol Cal: 16 mg/dL (ref 5–40)

## 2019-02-19 LAB — BRAIN NATRIURETIC PEPTIDE: BNP: 426.5 pg/mL — ABNORMAL HIGH (ref 0.0–100.0)

## 2019-02-19 MED ORDER — FUROSEMIDE 20 MG PO TABS: 20.0000 mg | ORAL_TABLET | Freq: Every day | ORAL | 1 refills | Status: DC

## 2019-02-20 MED FILL — ?FUROSEMIDE 20 MG TABLET: 20 | 30 days supply | Qty: 30 | Fill #0

## 2019-03-03 ENCOUNTER — Other Ambulatory Visit: Payer: Self-pay

## 2019-03-03 ENCOUNTER — Ambulatory Visit (HOSPITAL_COMMUNITY)
Admission: RE | Admit: 2019-03-03 | Discharge: 2019-03-03 | Disposition: A | Discharge status: Home / Self Care | Payer: Self-pay | Source: Ambulatory Visit | Attending: Nurse Practitioner | Admitting: Nurse Practitioner

## 2019-03-03 DIAGNOSIS — R0600 Dyspnea, unspecified: Secondary | ICD-10-CM | POA: Insufficient documentation

## 2019-03-03 DIAGNOSIS — R7989 Other specified abnormal findings of blood chemistry: Secondary | ICD-10-CM

## 2019-03-03 DIAGNOSIS — R0609 Other forms of dyspnea: Secondary | ICD-10-CM

## 2019-03-03 DIAGNOSIS — I509 Heart failure, unspecified: Secondary | ICD-10-CM | POA: Insufficient documentation

## 2019-03-03 DIAGNOSIS — I081 Rheumatic disorders of both mitral and tricuspid valves: Secondary | ICD-10-CM | POA: Insufficient documentation

## 2019-03-03 DIAGNOSIS — R609 Edema, unspecified: Secondary | ICD-10-CM

## 2019-03-03 NOTE — Progress Notes (Signed)
  Echocardiogram 2D Echocardiogram has been performed.  William Nguyen 03/03/2019, 2:46 PM

## 2019-03-04 ENCOUNTER — Telehealth (INDEPENDENT_AMBULATORY_CARE_PROVIDER_SITE_OTHER): Payer: Self-pay | Admitting: Cardiology

## 2019-03-04 ENCOUNTER — Encounter: Payer: Self-pay | Admitting: Cardiology

## 2019-03-04 ENCOUNTER — Telehealth: Payer: Self-pay | Admitting: *Deleted

## 2019-03-04 VITALS — BP 134/64 | Ht 70.0 in | Wt 174.0 lb

## 2019-03-04 DIAGNOSIS — F17210 Nicotine dependence, cigarettes, uncomplicated: Secondary | ICD-10-CM

## 2019-03-04 DIAGNOSIS — I10 Essential (primary) hypertension: Secondary | ICD-10-CM

## 2019-03-04 DIAGNOSIS — M7989 Other specified soft tissue disorders: Secondary | ICD-10-CM | POA: Insufficient documentation

## 2019-03-04 DIAGNOSIS — I5189 Other ill-defined heart diseases: Secondary | ICD-10-CM | POA: Insufficient documentation

## 2019-03-04 DIAGNOSIS — I2510 Atherosclerotic heart disease of native coronary artery without angina pectoris: Secondary | ICD-10-CM

## 2019-03-04 NOTE — Patient Instructions (Addendum)
Medication Instructions:   No changes to medicines.  *If you need a refill on your cardiac medications before your next appointment, please call your pharmacy*  Lab Work: None .   Testing/Procedures: None  Follow-Up: At [facility], you and your health needs are our priority.  As part of our continuing mission to provide you with exceptional heart care, we have created designated Provider Care Teams.  These Care Teams include your primary Cardiologist (physician) and Advanced Practice Providers (APPs -  Physician Assistants and Nurse Practitioners) who all work together to provide you with the care you need, when you need it.  Your next appointment:   12 month(s)  The format for your next appointment:   In Person  Provider:   Glenetta Hew, MD  Other Instructions  If you are going to be on your  feet for long period of time during the day, would recommend wearing support socks/travel socks that he can buy over-the-counter at [facility] or [facility].

## 2019-03-04 NOTE — Assessment & Plan Note (Signed)
His echo was somewhat hard to interpret however the EF is clearly normal.  Reported diastolic function was not grossly abnormal, however there is evidence of left atrial dilation.  Mostly this would be consistent with hypertensive heart disease.  Unlikely that this by itself would be consistent with lower extremity edema.  There was some evidence of right atrial dilation which could suggest mild pulmonary pretension of her right ventricular dysfunction, however the right ventricle appeared to be relatively normal on echocardiogram as well.  I think for now the best treatment option would be to continue blood pressure control and the medicine chosen was amlodipine.  As of now his pressure looks pretty good and we can simply continue current meds -> furosemide is providing some volume control and blood pressure adjustment as a diuretic..  If he were to have further evidence of hypertension, would probably consider afterload reduction with a ARB or ACE inhibitor

## 2019-03-04 NOTE — Assessment & Plan Note (Signed)
Gentleman with a was referred back because of leg swelling and an elevated BNP level.  He just had an echocardiogram that was essentially stable from 2018.  Normal EF both of 60 to 65% with no wall motion normality.  In the absence of PND or orthopnea, I do not think that the edema is related to CHF.  He is now on amlodipine for blood pressure which could be playing some role in his edema.  He does have cardiac risk factors which need to continue to monitor, however I do not think there is current edema is related to true CHF.  The BNP may be elevated related to hypertension and left atrial enlargement which is an indication of some diastolic dysfunction however it was not significant noted on echo.  Mainstay of treatment here would continue blood pressure control and the use of Lasix for edema is not unwarranted.

## 2019-03-04 NOTE — Progress Notes (Signed)
Virtual Visit via Telephone Note   This visit type was conducted due to national recommendations for restrictions regarding the COVID-19 Pandemic (e.g. social distancing) in an effort to limit this patient's exposure and mitigate transmission in our community.  Due to his co-morbid illnesses, this patient is at least at moderate risk for complications without adequate follow up.  This format is felt to be most appropriate for this patient at this time.  The patient did not have access to video technology/had technical difficulties with video requiring transitioning to audio format only (telephone).  All issues noted in this document were discussed and addressed.  No physical exam could be performed with this format.  Please refer to the patient's chart for his  consent to telehealth for William Nguyen.   Patient has given verbal permission to conduct this visit via virtual appointment and to bill insurance 03/04/2019 4:23 PM     Evaluation Performed:  Follow-up visit  Date:  03/04/2019   ID:  FENWICK PREVATT, DOB [birth year], MRN [id number]  Patient Location: Home Provider Location: Home  PCP:  Gildardo Pounds, NP  Cardiologist:  Glenetta Hew, MD   Chief Complaint:   Chief Complaint  Patient presents with  . re estalish follow up     last visit 33 month 05/2016. in 2020-  hernia repair 03/2018 , liver resection 06/2018.- no complaint voiced    History of Present Illness:    William Nguyen is a 49 y.o. male smoker with PMH notable for hypertension who presents via audio/video conferencing for a telehealth visit today to discuss elevated BNP level (426) and echocardiogram results ordered for leg edema.William Nguyen was last seen in April 2018 for follow-up evaluation of exertional dyspnea and subtle abnormalities on CPX.  He had evidence of coronary calcification seen on CT scan and there was a suggestion of hypertensive response to exercise on CPX that was evaluated with an echocardiogram  notes from all intents and purposes normal with some subtle changes of mild left atrial dilation. He had the echocardiogram done but did not come back for follow-up.  Hospitalizations:  . None   Recent - Interim CV studies:   The following studies were reviewed today:  . Echo 03/03/2019: EF 60-65%. No LVH. Mild-mod LA & mild RA dilation. Normal RV function.   Inerval History   William Nguyen is being reevaluated today about a year and half out from his last evaluation mostly because of BNP level that was somewhat elevated.  This was ordered for lower extremity swelling which he says is pretty well controlled now.  He takes low-dose Lasix and says that with exception of some mild end of day swelling is pretty well controlled.  As far as any heart failure symptoms he denies any PND orthopnea.  He may note some exertional dyspnea walking up hills or up rapidly to 5 steps but not with routine exercise or exertion.  Cardiovascular ROS: positive for - dyspnea on exertion, edema and As discussed above negative for - chest pain, irregular heartbeat, orthopnea, palpitations, paroxysmal nocturnal dyspnea, rapid heart rate, shortness of breath or Syncope/near syncope, TIA/amaurosis fugax  Down from PPD --> 1/2 PPD --> 4 per day  ROS:  Please see the history of present illness.    The patient does not have symptoms concerning for COVID-19 infection (fever, chills, cough, or new shortness of breath).  Review of Systems  Constitutional: Negative for malaise/fatigue and weight loss.  HENT: Negative  for congestion and nosebleeds.   Respiratory: Positive for cough (Still has a smoker's cough but nonproductive).   Gastrointestinal: Negative for blood in stool and melena.  Genitourinary: Negative for hematuria.  Musculoskeletal: Negative for falls and joint pain.  Neurological: Negative for dizziness, focal weakness and headaches.  Psychiatric/Behavioral: Negative for depression and memory loss. The  patient is not nervous/anxious and does not have insomnia.    The patient is practicing social distancing & masking  Past Medical History:  Diagnosis Date  . Alcohol abuse 10/18/2017   drinks a 6 pack daily  . Cocaine abuse ([place])   . Fracture of angle of right mandible, initial encounter for closed fracture ([place])   . GERD (gastroesophageal reflux disease)    h/o  . Hypertension   . 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 Pneumonia 03/2016  . Reducible left inguinal hernia   . Smoking   . Ulcer    gastric ulcers with bleeding episode   Past Surgical History:  Procedure Laterality Date  . CardioPulmonary Exercise Test: CPX  04/2016   Exercise testing with gas exchange shows a moderate functional limitation due to a circulatory limitation -- Peak VO2 53%.  There were no ST-T wave changes with exercise. Resting spirometry suggests mild restriction. Hypertensive response to exercise.  . hand surgery Right   . INGUINAL HERNIA REPAIR Left 02/17/2018   Procedure: OPEN HERNIA REPAIR INGUINAL ADULT;  Surgeon: Olean Ree, MD;  Location: [facility] ORS;  Service: General;  Laterality: Left;     Current Meds  Medication Sig  . albuterol (VENTOLIN HFA) 108 (90 Base) MCG/ACT inhaler Inhale 2 puffs into the lungs every 6 (six) hours as needed for wheezing or shortness of breath.  . allopurinol (ZYLOPRIM) 100 MG tablet Take 2 tablets (200 mg total) by mouth daily. (Patient taking differently: Take 100 mg by mouth daily. )  . amLODipine (NORVASC) 5 MG tablet Take 1 tablet (5 mg total) by mouth daily.  . furosemide (LASIX) 20 MG tablet Take 1 tablet (20 mg total) by mouth daily.  Marland Kitchen ibuprofen (ADVIL,MOTRIN) 600 MG tablet Take 1 tablet (600 mg total) by mouth every 8 (eight) hours as needed for fever, mild pain or moderate pain.  Marland Kitchen omeprazole (PRILOSEC) 20 MG capsule Take 1 capsule (20 mg total) by mouth 2 (two) times daily before a meal. (Patient taking differently: Take 20 mg by  mouth daily. )     Allergies:   Vicodin [hydrocodone-acetaminophen]   Social History   Tobacco Use  . Smoking status: Current Every Day Smoker    Packs/day: 0.50    Years: 25.00    Pack years: 12.50    Types: Cigarettes  . Smokeless tobacco: Never Used  Substance Use Topics  . Alcohol use: Not Currently    Alcohol/week: 2.0 standard drinks    Types: 2 Cans of beer per week    Comment: heavier in past, for the past 20 years, but now 12 on weekends  . Drug use: Not Currently    Comment: + UDS FOR COCAINE IN 2103      Family Hx: The patient's family history includes Healthy in his brother, father, mother, and sister; Heart disease in his paternal grandmother.   Labs/Other Tests and Data Reviewed:    EKG:  No ECG reviewed.  Recent Labs: 02/18/2019: ALT 34; BNP 426.5; BUN 13; Creatinine, Ser 0.84; Hemoglobin 11.9; Platelets 245; Potassium 4.5; Sodium 136   Recent Lipid Panel  Lab Results  Component Value Date/Time   CHOL 124 02/18/2019 10:31 AM   TRIG 81 02/18/2019 10:31 AM   HDL 44 02/18/2019 10:31 AM   CHOLHDL 2.8 02/18/2019 10:31 AM   LDLCALC 64 02/18/2019 10:31 AM    Wt Readings from Last 3 Encounters:  03/04/19 174 lb (78.9 kg)  12/02/18 153 lb (69.4 kg)  07/21/18 152 lb (68.9 kg)     Objective:    Vital Signs:  BP 134/64   Ht 5\' 10"  (1.778 m)   Wt 174 lb (78.9 kg)   BMI 24.97 kg/m   VITAL SIGNS:  reviewed Pleasant young man in no acute distress. A&O x 3.  Normal Mood & Affect Non-labored respirations   ASSESSMENT & PLAN:    Problem List Items Addressed This Visit    Tobacco smoker, less than 10 cigarettes per day (Chronic)    He said he is down to about 4 cigarettes a day and working on finally quitting.  Hopefully if he is able to maintain this low level of smoking, the next step of fully quitting would be easier to do.  I congratulated him on his efforts and encouraged him to continue trying.      Coronary artery calcification seen on CAT scan  (Chronic)    He was evaluated the past with a CPX for did identify coronary calcium.  Recommendation would be to consider treating his lipids however at current lipid panel shows excellent control without medications.  Continue to monitor for signs of ischemia, currently not having any anginal symptoms to warrant further evaluation.      Hypertension (Chronic)    Blood pressure seems pretty well controlled today on amlodipine plus Lasix.  Once his swelling is controlled, could consider switching from Lasix to HCTZ with better blood pressure control and still some added benefit of a diuretic.      Leg swelling - Primary    Gentleman with a was referred back because of leg swelling and an elevated BNP level.  He just had an echocardiogram that was essentially stable from 2018.  Normal EF both of 60 to 65% with no wall motion normality.  In the absence of PND or orthopnea, I do not think that the edema is related to CHF.  He is now on amlodipine for blood pressure which could be playing some role in his edema.  He does have cardiac risk factors which need to continue to monitor, however I do not think there is current edema is related to true CHF.  The BNP may be elevated related to hypertension and left atrial enlargement which is an indication of some diastolic dysfunction however it was not significant noted on echo.  Mainstay of treatment here would continue blood pressure control and the use of Lasix for edema is not unwarranted.      Diastolic dysfunction without heart failure    His echo was somewhat hard to interpret however the EF is clearly normal.  Reported diastolic function was not grossly abnormal, however there is evidence of left atrial dilation.  Mostly this would be consistent with hypertensive heart disease.  Unlikely that this by itself would be consistent with lower extremity edema.  There was some evidence of right atrial dilation which could suggest mild pulmonary  pretension of her right ventricular dysfunction, however the right ventricle appeared to be relatively normal on echocardiogram as well.  I think for now the best treatment option would be to continue blood pressure control and  the medicine chosen was amlodipine.  As of now his pressure looks pretty good and we can simply continue current meds -> furosemide is providing some volume control and blood pressure adjustment as a diuretic..  If he were to have further evidence of hypertension, would probably consider afterload reduction with a ARB or ACE inhibitor         COVID-19 Education: The signs and symptoms of COVID-19 were discussed with the patient and how to seek care for testing (follow up with PCP or arrange E-visit).   The importance of social distancing was discussed today.  Time:   Today, I have spent 13 minutes with the patient with telehealth technology discussing the above problems.  5 minutes in chart review   Medication Adjustments/Labs and Tests Ordered: Current medicines are reviewed at length with the patient today.  Concerns regarding medicines are outlined above.   Patient Instructions  Medication Instructions:   No changes to medicines.  *If you need a refill on your cardiac medications before your next appointment, please call your pharmacy*  Lab Work: None .   Testing/Procedures: None  Follow-Up: At [facility], you and your health needs are our priority.  As part of our continuing mission to provide you with exceptional heart care, we have created designated Provider Care Teams.  These Care Teams include your primary Cardiologist (physician) and Advanced Practice Providers (APPs -  Physician Assistants and Nurse Practitioners) who all work together to provide you with the care you need, when you need it.  Your next appointment:   12 month(s)  The format for your next appointment:   In Person  Provider:   Glenetta Hew, MD  Other  Instructions  If you are going to be on your  feet for long period of time during the day, would recommend wearing support socks/travel socks that he can buy over-the-counter at [facility] or [facility].     Signed, Glenetta Hew, MD  03/04/2019 4:23 PM    [place]

## 2019-03-04 NOTE — Assessment & Plan Note (Signed)
He said he is down to about 4 cigarettes a day and working on finally quitting.  Hopefully if he is able to maintain this low level of smoking, the next step of fully quitting would be easier to do.  I congratulated him on his efforts and encouraged him to continue trying.

## 2019-03-04 NOTE — Assessment & Plan Note (Signed)
He was evaluated the past with a CPX for did identify coronary calcium.  Recommendation would be to consider treating his lipids however at current lipid panel shows excellent control without medications.  Continue to monitor for signs of ischemia, currently not having any anginal symptoms to warrant further evaluation.

## 2019-03-04 NOTE — Assessment & Plan Note (Signed)
Blood pressure seems pretty well controlled today on amlodipine plus Lasix.  Once his swelling is controlled, could consider switching from Lasix to HCTZ with better blood pressure control and still some added benefit of a diuretic.

## 2019-03-04 NOTE — Telephone Encounter (Signed)
RN spoke to patient. Instruction were given  from today's virtual visit  03/04/19 .  AVS  summary has been mailed .   Patient verbalized understanding 

## 2019-03-12 MED FILL — ?ALLOPURINOL 100MG TABLET: 100 | 30 days supply | Qty: 60 | Fill #1

## 2019-03-12 MED FILL — ?OMEPRAZOLE 20MG CAP DR: 20 | 30 days supply | Qty: 60 | Fill #2

## 2019-03-17 MED FILL — AMLODIPINE BESYLATE 5 MG TA: 5 | 30 days supply | Qty: 30 | Fill #1

## 2019-03-17 MED FILL — ?FUROSEMIDE 20 MG TABLET: 20 | 30 days supply | Qty: 30 | Fill #1

## 2019-03-23 ENCOUNTER — Other Ambulatory Visit: Payer: Self-pay | Admitting: Transplant Hepatology

## 2019-03-23 ENCOUNTER — Other Ambulatory Visit (HOSPITAL_COMMUNITY): Payer: Self-pay | Admitting: Transplant Hepatology

## 2019-03-23 DIAGNOSIS — D134 Benign neoplasm of liver: Secondary | ICD-10-CM

## 2019-04-03 ENCOUNTER — Other Ambulatory Visit: Payer: Self-pay

## 2019-04-03 ENCOUNTER — Emergency Department (HOSPITAL_COMMUNITY): Payer: Self-pay

## 2019-04-03 ENCOUNTER — Ambulatory Visit (HOSPITAL_COMMUNITY)
Admission: RE | Admit: 2019-04-03 | Discharge: 2019-04-03 | Disposition: A | Discharge status: Home / Self Care | Payer: Self-pay | Source: Ambulatory Visit | Attending: Transplant Hepatology | Admitting: Transplant Hepatology

## 2019-04-03 ENCOUNTER — Encounter (HOSPITAL_COMMUNITY): Payer: Self-pay

## 2019-04-03 ENCOUNTER — Emergency Department (HOSPITAL_COMMUNITY)
Admission: EM | Admit: 2019-04-03 | Discharge: 2019-04-03 | Disposition: A | Discharge status: Home / Self Care | Payer: Self-pay | Attending: Emergency Medicine | Admitting: Emergency Medicine

## 2019-04-03 ENCOUNTER — Encounter (HOSPITAL_COMMUNITY): Payer: Self-pay | Admitting: Emergency Medicine

## 2019-04-03 DIAGNOSIS — D134 Benign neoplasm of liver: Secondary | ICD-10-CM

## 2019-04-03 DIAGNOSIS — R0789 Other chest pain: Secondary | ICD-10-CM | POA: Insufficient documentation

## 2019-04-03 DIAGNOSIS — F1490 Cocaine use, unspecified, uncomplicated: Secondary | ICD-10-CM | POA: Insufficient documentation

## 2019-04-03 DIAGNOSIS — Z79899 Other long term (current) drug therapy: Secondary | ICD-10-CM | POA: Insufficient documentation

## 2019-04-03 DIAGNOSIS — F17210 Nicotine dependence, cigarettes, uncomplicated: Secondary | ICD-10-CM | POA: Insufficient documentation

## 2019-04-03 DIAGNOSIS — I10 Essential (primary) hypertension: Secondary | ICD-10-CM | POA: Insufficient documentation

## 2019-04-03 DIAGNOSIS — R778 Other specified abnormalities of plasma proteins: Secondary | ICD-10-CM | POA: Insufficient documentation

## 2019-04-03 LAB — RAPID URINE DRUG SCREEN, HOSP PERFORMED
Amphetamines: NOT DETECTED
Barbiturates: NOT DETECTED
Benzodiazepines: NOT DETECTED
Cocaine: POSITIVE — AB
Opiates: NOT DETECTED
Tetrahydrocannabinol: NOT DETECTED

## 2019-04-03 LAB — CBC WITH DIFFERENTIAL/PLATELET
Abs Immature Granulocytes: 0.02 10*3/uL (ref 0.00–0.07)
Basophils Absolute: 0 10*3/uL (ref 0.0–0.1)
Basophils Relative: 0 %
Eosinophils Absolute: 0.2 10*3/uL (ref 0.0–0.5)
Eosinophils Relative: 2 %
HCT: 37.8 % — ABNORMAL LOW (ref 39.0–52.0)
Hemoglobin: 12.9 g/dL — ABNORMAL LOW (ref 13.0–17.0)
Immature Granulocytes: 0 %
Lymphocytes Relative: 29 %
Lymphs Abs: 2.2 10*3/uL (ref 0.7–4.0)
MCH: 32.9 pg (ref 26.0–34.0)
MCHC: 34.1 g/dL (ref 30.0–36.0)
MCV: 96.4 fL (ref 80.0–100.0)
Monocytes Absolute: 1.2 10*3/uL — ABNORMAL HIGH (ref 0.1–1.0)
Monocytes Relative: 16 %
Neutro Abs: 3.9 10*3/uL (ref 1.7–7.7)
Neutrophils Relative %: 53 %
Platelets: 246 10*3/uL (ref 150–400)
RBC: 3.92 MIL/uL — ABNORMAL LOW (ref 4.22–5.81)
RDW: 13.2 % (ref 11.5–15.5)
WBC: 7.5 10*3/uL (ref 4.0–10.5)
nRBC: 0.3 % — ABNORMAL HIGH (ref 0.0–0.2)

## 2019-04-03 LAB — TROPONIN I (HIGH SENSITIVITY)
Troponin I (High Sensitivity): 56 ng/L — ABNORMAL HIGH (ref <18)
Troponin I (High Sensitivity): 61 ng/L — ABNORMAL HIGH (ref <18)

## 2019-04-03 LAB — BASIC METABOLIC PANEL
Anion gap: 19 — ABNORMAL HIGH (ref 5–15)
BUN: 13 mg/dL (ref 6–20)
CO2: 20 mmol/L — ABNORMAL LOW (ref 22–32)
Calcium: 9.5 mg/dL (ref 8.9–10.3)
Chloride: 96 mmol/L — ABNORMAL LOW (ref 98–111)
Creatinine, Ser: 0.86 mg/dL (ref 0.61–1.24)
GFR calc Af Amer: >60 mL/min (ref >60)
GFR calc non Af Amer: >60 mL/min (ref >60)
Glucose, Bld: 101 mg/dL — ABNORMAL HIGH (ref 70–99)
Potassium: 4.1 mmol/L (ref 3.5–5.1)
Sodium: 135 mmol/L (ref 135–145)

## 2019-04-03 MED ORDER — ASPIRIN 81 MG PO CHEW
324.0000 mg | CHEWABLE_TABLET | Freq: Once | ORAL | Status: AC
  Administered 2019-04-03: 324 mg via ORAL
  Filled 2019-04-03: qty 4

## 2019-04-03 MED ORDER — SODIUM CHLORIDE 0.9 % IV BOLUS
500.0000 mL | Freq: Once | INTRAVENOUS | Status: AC
  Administered 2019-04-03: 500 mL via INTRAVENOUS

## 2019-04-03 NOTE — Discharge Instructions (Signed)
Your EKG today was abnormal.  Your troponin which is the enzyme released into the bloodstream and there has been damage to the heart muscle is a little bit elevated.  We spoke with Dr. Ellyn Hack and he would like to see you in the office as soon as possible so if you do not hear from him by Monday please call to schedule a follow-up appointment.  In the meantime you can take 600 mg of ibuprofen or 1 to 2 tablets of extra strength Tylenol every 6 hours as needed for pain.  Return to the emergency department if any concerning signs or symptoms develop such as high fevers, persistent vomiting, severe worsening pain or shortness of breath.

## 2019-04-03 NOTE — ED Notes (Signed)
Patient states he is not able to provide urine sample at this time

## 2019-04-03 NOTE — ED Provider Notes (Signed)
[facility] DEPT Provider Note   CSN: [id number] Arrival date & time: 04/03/19  1227     History Chief Complaint  Patient presents with  . Chest Pain    William Nguyen is a 49 y.o. male with history of alcohol abuse, cocaine abuse, hypertension, GERD, coronary artery calcification seen on CT scan presents today for evaluation of acute onset, intermittent left-sided chest pains beginning at around 8 AM this morning.  He reports that he went to bed last night in his usual state of health awoke at around 8 AM with a feeling of discomfort in the left side of chest that he states ".  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  The pain worsens with leaning forward.  He denies any shortness of breath, abdominal pain, nausea, vomiting, lightheadedness, diaphoresis, or syncope.  No fevers, chills, or cough.  He is a current smoker of 5 cigarettes daily, denies recreational drug use.  He does have a strong family history of heart disease he states, but no first-degree relatives under 71 diagnosed with CAD.  Reports he has been experiencing bilateral lower extremity edema for several months.  She was placed on a low-dose furosemide with improvement.  He reports compliance with his medications.  The history is provided by the patient.       Past Medical History:  Diagnosis Date  . Alcohol abuse 10/18/2017   drinks a 6 pack daily  . Cocaine abuse ([place])   . Fracture of angle of right mandible, initial encounter for closed fracture ([place])   . GERD (gastroesophageal reflux disease)    h/o  . Hypertension   . 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 Pneumonia 03/2016  . Reducible left inguinal hernia   . Smoking   . Ulcer    gastric ulcers with bleeding episode    Patient Active Problem List   Diagnosis Date Noted  . Leg swelling 03/04/2019  . Hypertension 03/04/2019  .  Diastolic dysfunction without heart failure 03/04/2019  . Reducible left inguinal hernia 10/18/2017  . Abnormal finding on cardiovascular stress test 05/14/2016  . Dyspnea on exertion 04/16/2016  . Tobacco smoker, less than 10 cigarettes per day 04/16/2016  . Coronary artery calcification seen on CAT scan 04/16/2016  . 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 Pneumonia 03/17/2016  . Liver mass, right lobe 03/17/2016    Past Surgical History:  Procedure Laterality Date  . CardioPulmonary Exercise Test: CPX  04/2016   Exercise testing with gas exchange shows a moderate functional limitation due to a circulatory limitation -- Peak VO2 53%.  There were no ST-T wave changes with exercise. Resting spirometry suggests mild restriction. Hypertensive response to exercise.  . hand surgery Right   . INGUINAL HERNIA REPAIR Left 02/17/2018   Procedure: OPEN HERNIA REPAIR INGUINAL ADULT;  Surgeon: Olean Ree, MD;  Location: [facility] ORS;  Service: General;  Laterality: Left;       Family History  Problem Relation Age of Onset  . Healthy Mother   . Healthy Father   . Healthy Sister   . Healthy Brother   . Heart disease Paternal Grandmother        Unsure of details - still living at age    Social History   Tobacco Use  . Smoking status: Current Every Day Smoker    Packs/day: 0.50  Years: 25.00    Pack years: 12.50    Types: Cigarettes  . Smokeless tobacco: Never Used  Substance Use Topics  . Alcohol use: Not Currently    Alcohol/week: 2.0 standard drinks    Types: 2 Cans of beer per week    Comment: heavier in past, for the past 20 years, but now 12 on weekends  . Drug use: Not Currently    Comment: + UDS FOR COCAINE IN 2103     Home Medications Prior to Admission medications   Medication Sig Start Date End Date Taking? Authorizing Provider  allopurinol (ZYLOPRIM) 100 MG tablet Take 2 tablets (200 mg total) by mouth daily. Patient taking differently: Take 100 mg by mouth daily.  07/24/18  04/03/19 Yes Gildardo Pounds, NP  amLODipine (NORVASC) 5 MG tablet Take 1 tablet (5 mg total) by mouth daily. 02/17/19 05/18/19 Yes Gildardo Pounds, NP  Coenzyme Q10 (CO Q-10) 100 MG CAPS Take 100 mg by mouth daily.   Yes [provider]  furosemide (LASIX) 20 MG tablet Take 1 tablet (20 mg total) by mouth daily. 02/19/19 04/03/19 Yes Gildardo Pounds, NP  ibuprofen (ADVIL,MOTRIN) 600 MG tablet Take 1 tablet (600 mg total) by mouth every 8 (eight) hours as needed for fever, mild pain or moderate pain. 02/17/18  Yes Piscoya, Jacqulyn Bath, MD  omeprazole (PRILOSEC) 20 MG capsule Take 1 capsule (20 mg total) by mouth 2 (two) times daily before a meal. Patient taking differently: Take 20 mg by mouth daily.  11/13/18 04/03/19 Yes Charlott Rakes, MD  albuterol (VENTOLIN HFA) 108 (90 Base) MCG/ACT inhaler Inhale 2 puffs into the lungs every 6 (six) hours as needed for wheezing or shortness of breath. Patient not taking: Reported on 04/03/2019 12/31/18   Argentina Donovan, PA-C    Allergies    Vicodin [hydrocodone-acetaminophen]  Review of Systems   Review of Systems  Constitutional: Negative for chills, diaphoresis and fever.  Respiratory: Negative for cough and shortness of breath.   Cardiovascular: Positive for chest pain and leg swelling.  Gastrointestinal: Negative for abdominal pain, nausea and vomiting.  Neurological: Negative for syncope and light-headedness.  All other systems reviewed and are negative.   Physical Exam Updated Vital Signs BP 135/71   Pulse (!) 104   Temp 98.2 F (36.8 C) (Oral)   Resp 18   Ht 5\' 10"  (1.778 m)   Wt 79.4 kg   SpO2 96%   BMI 25.11 kg/m   Physical Exam Vitals and nursing note reviewed.  Constitutional:      General: He is not in acute distress.    Appearance: He is well-developed.  HENT:     Head: Normocephalic and atraumatic.  Eyes:     General:        Right eye: No discharge.        Left eye: No discharge.     Conjunctiva/sclera:  Conjunctivae normal.  Neck:     Vascular: No JVD.     Trachea: No tracheal deviation.  Cardiovascular:     Rate and Rhythm: Regular rhythm. Tachycardia present.     Pulses:          Radial pulses are 2+ on the right side and 2+ on the left side.       Dorsalis pedis pulses are 2+ on the right side and 2+ on the left side.       Posterior tibial pulses are 2+ on the right side and 2+ on the left side.  Comments: Trace nonpitting edema of the bilateral lower extremities, Homans' sign absent bilaterally, compartments are soft. Pulmonary:     Effort: Pulmonary effort is normal.     Comments: Speaking in full sentences without difficulty. Chest:     Chest wall: Tenderness present.       Comments: Focal tenderness to palpation area of the left side of the chest wall with no deformity, crepitus, ecchymosis or flail segment. Abdominal:     General: There is no distension.  Musculoskeletal:     Cervical back: Normal range of motion and neck supple.     Right lower leg: No tenderness. Edema present.     Left lower leg: No tenderness. Edema present.  Skin:    General: Skin is warm and dry.     Findings: No erythema.  Neurological:     Mental Status: He is alert.  Psychiatric:        Behavior: Behavior normal.     ED Results / Procedures / Treatments   Labs (all labs ordered are listed, but only abnormal results are displayed) Labs Reviewed  BASIC METABOLIC PANEL - Abnormal; Notable for the following components:      Result Value   Chloride 96 (*)    CO2 20 (*)    Glucose, Bld 101 (*)    Anion gap 19 (*)    All other components within normal limits  CBC WITH DIFFERENTIAL/PLATELET - Abnormal; Notable for the following components:   RBC 3.92 (*)    Hemoglobin 12.9 (*)    HCT 37.8 (*)    nRBC 0.3 (*)    Monocytes Absolute 1.2 (*)    All other components within normal limits  RAPID URINE DRUG SCREEN, HOSP PERFORMED - Abnormal; Notable for the following components:   Cocaine  POSITIVE (*)    All other components within normal limits  TROPONIN I (HIGH SENSITIVITY) - Abnormal; Notable for the following components:   Troponin I (High Sensitivity) 56 (*)    All other components within normal limits  TROPONIN I (HIGH SENSITIVITY) - Abnormal; Notable for the following components:   Troponin I (High Sensitivity) 61 (*)    All other components within normal limits    EKG EKG Interpretation  Date/Time:  Friday April 03 2019 12:37:42 EST Ventricular Rate:  98 PR Interval:    QRS Duration: 109 QT Interval:  400 QTC Calculation: 511 R Axis:   66 Text Interpretation: Sinus tachycardia Atrial premature complex Probable left atrial enlargement RSR' in V1 or V2, probably normal variant Abnrm T, consider ischemia, anterolateral lds Prolonged QT interval Baseline wander in lead(s) I III aVL Confirmed by Milton Ferguson [phone]) on 04/03/2019 12:53:25 PM   Radiology DG Chest 2 View  Result Date: 04/03/2019 CLINICAL DATA:  Chest pain EXAM: CHEST - 2 VIEW COMPARISON:  Chest radiograph November 07, 2009; chest CT April 06, 2016 FINDINGS: The lungs are clear. There is cardiomegaly with pulmonary vascularity normal. No adenopathy. No pneumothorax. No bone lesions. IMPRESSION: Cardiomegaly.  Lungs clear. Electronically Signed   By: Lowella Grip III M.D.   On: 04/03/2019 13:18    Procedures Procedures (including critical care time)  Medications Ordered in ED Medications  aspirin chewable tablet 324 mg (324 mg Oral Given 04/03/19 1455)  sodium chloride 0.9 % bolus 500 mL (0 mLs Intravenous Stopped 04/03/19 1628)    ED Course  I have reviewed the triage vital signs and the nursing notes.  Pertinent labs & imaging results that were available during  my care of the patient were reviewed by me and considered in my medical decision making (see chart for details).   MDM Rules/Calculators/A&P                      Patient presenting for evaluation of focal left-sided chest  pains that began earlier today.  Worsens with certain movements.  He is afebrile, somewhat tachycardic in the ED but vital signs otherwise stable and he is resting quite comfortably on examination.  The pain is reproducible on palpation.  It is quite atypical for ACS/MI however his EKG shows ischemic changes with T wave inversions in the lateral leads.  This appears unchanged from prior.  Will obtain lab work, troponin, chest x-ray and reassess.  Lab work reviewed and interpreted by me shows no leukocytosis, mild anemia, no renal insufficiency.  His initial troponin is elevated at 56.  2:20PM CONSULT: Spoke with Dr. Ellyn Hack who is the patient's cardiologist.  He reviewed the patient's EKG and does note that he has some concerning changes but that it could represent LVH repolarization.  He recommends trending the patient's troponins if they are stable then he will be stable for discharge home with close outpatient cardiology follow-up.  Otherwise will require admission.  Second troponin elevated at 61.  This is marginally elevated compared to initial troponin.  On reevaluation patient is resting comfortably in no apparent distress.  He is eating to [place] sandwiches.  He reports that he has been wanting to leave for several hours and that the only reason that he came in was because he was already scheduled for an outpatient MRI earlier today.  We had a shared decision-making conversation and he understands to follow-up closely with Dr. Ellyn Hack on an outpatient basis.  Doubt PE, dissection, cardiac tamponade, esophageal rupture, pneumonia or pneumothorax.  His UDS is positive for cocaine but he denied any drug use to me.  This could explain his tachycardia.  Discussed strict ED return precautions. Patient verbalized understanding of and agreement with plan and is safe for discharge home at this time. Discussed with Dr. Roderic Palau who reviewed work-up and agrees with assessment and plan at this time.  Final  Clinical Impression(s) / ED Diagnoses Final diagnoses:  Atypical chest pain  Elevated troponin  Cocaine use    Rx / DC Orders ED Discharge Orders         Ordered    Ambulatory referral to Cardiology    Comments: Sees Dr. Ellyn Hack; abnormal EKG and elevated trop   04/03/19 [place], Aleesha Ringstad A, PA-C 04/03/19 1708    Milton Ferguson, MD 04/04/19 [phone]

## 2019-04-03 NOTE — ED Triage Notes (Signed)
Patient experiences tight and sharp chest pain on his left with movement. He also complains of soreness in the [facility] bilaterally.

## 2019-04-03 NOTE — ED Notes (Signed)
Urinal at bedside for collection for U/A per MD order. [employer]

## 2019-04-13 ENCOUNTER — Other Ambulatory Visit: Payer: Self-pay | Admitting: Physician Assistant

## 2019-04-13 ENCOUNTER — Other Ambulatory Visit: Payer: Self-pay | Admitting: Nurse Practitioner

## 2019-04-13 DIAGNOSIS — R609 Edema, unspecified: Secondary | ICD-10-CM

## 2019-04-13 MED FILL — ?OMEPRAZOLE 20MG CAP DR: 20 | 30 days supply | Qty: 60 | Fill #0

## 2019-04-13 MED FILL — AMLODIPINE BESYLATE 5 MG TA: 5 | 30 days supply | Qty: 30 | Fill #2

## 2019-04-14 MED FILL — ALBUTEROL SULFATE HFA 108 (: 108 (90 BAS | 25 days supply | Qty: 18 | Fill #0

## 2019-04-15 ENCOUNTER — Other Ambulatory Visit: Payer: Self-pay | Admitting: Nurse Practitioner

## 2019-04-15 DIAGNOSIS — R609 Edema, unspecified: Secondary | ICD-10-CM

## 2019-04-20 ENCOUNTER — Other Ambulatory Visit: Payer: Self-pay

## 2019-04-20 ENCOUNTER — Ambulatory Visit: Payer: Self-pay | Attending: Nurse Practitioner

## 2019-04-20 DIAGNOSIS — D649 Anemia, unspecified: Secondary | ICD-10-CM

## 2019-04-20 DIAGNOSIS — R609 Edema, unspecified: Secondary | ICD-10-CM

## 2019-04-20 DIAGNOSIS — R0609 Other forms of dyspnea: Secondary | ICD-10-CM

## 2019-04-20 DIAGNOSIS — R7989 Other specified abnormal findings of blood chemistry: Secondary | ICD-10-CM

## 2019-04-21 LAB — CBC
Hematocrit: 39 % (ref 37.5–51.0)
Hemoglobin: 13.6 g/dL (ref 13.0–17.7)
MCH: 33.2 pg — ABNORMAL HIGH (ref 26.6–33.0)
MCHC: 34.9 g/dL (ref 31.5–35.7)
MCV: 95 fL (ref 79–97)
Platelets: 265 10*3/uL (ref 150–450)
RBC: 4.1 x10E6/uL — ABNORMAL LOW (ref 4.14–5.80)
RDW: 13.1 % (ref 11.6–15.4)
WBC: 7.2 10*3/uL (ref 3.4–10.8)

## 2019-04-21 LAB — BRAIN NATRIURETIC PEPTIDE: BNP: 279 pg/mL — ABNORMAL HIGH (ref 0.0–100.0)

## 2019-04-22 ENCOUNTER — Other Ambulatory Visit: Payer: Self-pay | Admitting: Nurse Practitioner

## 2019-04-22 DIAGNOSIS — R609 Edema, unspecified: Secondary | ICD-10-CM

## 2019-04-22 DIAGNOSIS — K219 Gastro-esophageal reflux disease without esophagitis: Secondary | ICD-10-CM

## 2019-04-22 MED ORDER — OMEPRAZOLE 20 MG PO CPDR: 20.0000 mg | DELAYED_RELEASE_CAPSULE | Freq: Two times a day (BID) | ORAL | 2 refills | Status: AC

## 2019-04-22 MED ORDER — FUROSEMIDE 20 MG PO TABS: 20.0000 mg | ORAL_TABLET | Freq: Every day | ORAL | 2 refills | Status: AC

## 2019-04-22 MED FILL — FUROSEMIDE 20 MG TABS: 20 | 30 days supply | Qty: 30 | Fill #0

## 2019-04-30 ENCOUNTER — Ambulatory Visit: Payer: Medicaid Other | Attending: Internal Medicine

## 2019-04-30 DIAGNOSIS — Z23 Encounter for immunization: Secondary | ICD-10-CM

## 2019-04-30 NOTE — Progress Notes (Signed)
   Covid-19 Vaccination Clinic  Name:  William Nguyen    MRN: [id number] DOB: 1970/08/14  04/30/2019  Mr. Risby was observed post Covid-19 immunization for 15 minutes without incident. He was provided with Vaccine Information Sheet and instruction to access the V-Safe system.   Mr. Scherger was instructed to call 911 with any severe reactions post vaccine: Marland Kitchen Difficulty breathing  . Swelling of face and throat  . A fast heartbeat  . A bad rash all over body  . Dizziness and weakness   Immunizations Administered    Name Date Dose VIS Date Route   Pfizer COVID-19 Vaccine 04/30/2019  9:32 AM 0.3 mL 01/16/2019 Intramuscular   Manufacturer: [place]   Lot: [id number]   [place]: [id number]

## 2019-05-06 ENCOUNTER — Encounter (HOSPITAL_COMMUNITY): Payer: Self-pay

## 2019-05-06 ENCOUNTER — Other Ambulatory Visit: Payer: Self-pay

## 2019-05-06 ENCOUNTER — Ambulatory Visit: Payer: Self-pay

## 2019-05-06 ENCOUNTER — Emergency Department (HOSPITAL_COMMUNITY): Payer: Self-pay

## 2019-05-06 ENCOUNTER — Inpatient Hospital Stay (HOSPITAL_COMMUNITY)
Admission: EM | Admit: 2019-05-06 | Discharge: 2019-06-06 | DRG: 441 | Disposition: E | Payer: Self-pay | Attending: Emergency Medicine | Admitting: Emergency Medicine

## 2019-05-06 DIAGNOSIS — E162 Hypoglycemia, unspecified: Secondary | ICD-10-CM | POA: Diagnosis not present

## 2019-05-06 DIAGNOSIS — R34 Anuria and oliguria: Secondary | ICD-10-CM | POA: Diagnosis present

## 2019-05-06 DIAGNOSIS — K7040 Alcoholic hepatic failure without coma: Secondary | ICD-10-CM | POA: Diagnosis present

## 2019-05-06 DIAGNOSIS — J9602 Acute respiratory failure with hypercapnia: Secondary | ICD-10-CM | POA: Diagnosis present

## 2019-05-06 DIAGNOSIS — E872 Acidosis, unspecified: Secondary | ICD-10-CM | POA: Diagnosis present

## 2019-05-06 DIAGNOSIS — Z20822 Contact with and (suspected) exposure to covid-19: Secondary | ICD-10-CM | POA: Diagnosis present

## 2019-05-06 DIAGNOSIS — K769 Liver disease, unspecified: Secondary | ICD-10-CM

## 2019-05-06 DIAGNOSIS — D134 Benign neoplasm of liver: Secondary | ICD-10-CM

## 2019-05-06 DIAGNOSIS — Z452 Encounter for adjustment and management of vascular access device: Secondary | ICD-10-CM

## 2019-05-06 DIAGNOSIS — E874 Mixed disorder of acid-base balance: Secondary | ICD-10-CM | POA: Diagnosis present

## 2019-05-06 DIAGNOSIS — Z8711 Personal history of peptic ulcer disease: Secondary | ICD-10-CM

## 2019-05-06 DIAGNOSIS — K219 Gastro-esophageal reflux disease without esophagitis: Secondary | ICD-10-CM | POA: Diagnosis present

## 2019-05-06 DIAGNOSIS — K7011 Alcoholic hepatitis with ascites: Secondary | ICD-10-CM | POA: Diagnosis present

## 2019-05-06 DIAGNOSIS — R571 Hypovolemic shock: Secondary | ICD-10-CM | POA: Diagnosis present

## 2019-05-06 DIAGNOSIS — K567 Ileus, unspecified: Secondary | ICD-10-CM | POA: Diagnosis present

## 2019-05-06 DIAGNOSIS — Y900 Blood alcohol level of less than 20 mg/100 ml: Secondary | ICD-10-CM | POA: Diagnosis present

## 2019-05-06 DIAGNOSIS — Z8249 Family history of ischemic heart disease and other diseases of the circulatory system: Secondary | ICD-10-CM

## 2019-05-06 DIAGNOSIS — K767 Hepatorenal syndrome: Principal | ICD-10-CM | POA: Diagnosis present

## 2019-05-06 DIAGNOSIS — I5033 Acute on chronic diastolic (congestive) heart failure: Secondary | ICD-10-CM | POA: Insufficient documentation

## 2019-05-06 DIAGNOSIS — K7031 Alcoholic cirrhosis of liver with ascites: Secondary | ICD-10-CM | POA: Diagnosis present

## 2019-05-06 DIAGNOSIS — Z978 Presence of other specified devices: Secondary | ICD-10-CM

## 2019-05-06 DIAGNOSIS — Z9049 Acquired absence of other specified parts of digestive tract: Secondary | ICD-10-CM

## 2019-05-06 DIAGNOSIS — I5032 Chronic diastolic (congestive) heart failure: Secondary | ICD-10-CM | POA: Diagnosis present

## 2019-05-06 DIAGNOSIS — I2510 Atherosclerotic heart disease of native coronary artery without angina pectoris: Secondary | ICD-10-CM | POA: Diagnosis present

## 2019-05-06 DIAGNOSIS — R7989 Other specified abnormal findings of blood chemistry: Secondary | ICD-10-CM | POA: Insufficient documentation

## 2019-05-06 DIAGNOSIS — D539 Nutritional anemia, unspecified: Secondary | ICD-10-CM | POA: Diagnosis present

## 2019-05-06 DIAGNOSIS — J9382 Other air leak: Secondary | ICD-10-CM | POA: Diagnosis not present

## 2019-05-06 DIAGNOSIS — J9601 Acute respiratory failure with hypoxia: Secondary | ICD-10-CM | POA: Diagnosis present

## 2019-05-06 DIAGNOSIS — Z66 Do not resuscitate: Secondary | ICD-10-CM | POA: Diagnosis not present

## 2019-05-06 DIAGNOSIS — F1011 Alcohol abuse, in remission: Secondary | ICD-10-CM

## 2019-05-06 DIAGNOSIS — F17200 Nicotine dependence, unspecified, uncomplicated: Secondary | ICD-10-CM | POA: Insufficient documentation

## 2019-05-06 DIAGNOSIS — K7010 Alcoholic hepatitis without ascites: Secondary | ICD-10-CM

## 2019-05-06 DIAGNOSIS — M109 Gout, unspecified: Secondary | ICD-10-CM | POA: Diagnosis present

## 2019-05-06 DIAGNOSIS — F17210 Nicotine dependence, cigarettes, uncomplicated: Secondary | ICD-10-CM | POA: Diagnosis present

## 2019-05-06 DIAGNOSIS — I110 Hypertensive heart disease with heart failure: Secondary | ICD-10-CM | POA: Diagnosis present

## 2019-05-06 DIAGNOSIS — N179 Acute kidney failure, unspecified: Secondary | ICD-10-CM | POA: Insufficient documentation

## 2019-05-06 DIAGNOSIS — Z515 Encounter for palliative care: Secondary | ICD-10-CM | POA: Diagnosis not present

## 2019-05-06 DIAGNOSIS — R7401 Elevation of levels of liver transaminase levels: Secondary | ICD-10-CM | POA: Insufficient documentation

## 2019-05-06 DIAGNOSIS — Z4659 Encounter for fitting and adjustment of other gastrointestinal appliance and device: Secondary | ICD-10-CM

## 2019-05-06 DIAGNOSIS — F1010 Alcohol abuse, uncomplicated: Secondary | ICD-10-CM | POA: Diagnosis present

## 2019-05-06 LAB — COMPREHENSIVE METABOLIC PANEL
ALT: 353 U/L — ABNORMAL HIGH (ref 0–44)
AST: 679 U/L — ABNORMAL HIGH (ref 15–41)
Albumin: 4.3 g/dL (ref 3.5–5.0)
Alkaline Phosphatase: 139 U/L — ABNORMAL HIGH (ref 38–126)
BUN: 36 mg/dL — ABNORMAL HIGH (ref 6–20)
CO2: <7 mmol/L — ABNORMAL LOW (ref 22–32)
Calcium: 9.8 mg/dL (ref 8.9–10.3)
Chloride: 92 mmol/L — ABNORMAL LOW (ref 98–111)
Creatinine, Ser: 2.15 mg/dL — ABNORMAL HIGH (ref 0.61–1.24)
GFR calc Af Amer: 41 mL/min — ABNORMAL LOW (ref >60)
GFR calc non Af Amer: 36 mL/min — ABNORMAL LOW (ref >60)
Glucose, Bld: 59 mg/dL — ABNORMAL LOW (ref 70–99)
Potassium: 4.9 mmol/L (ref 3.5–5.1)
Sodium: 131 mmol/L — ABNORMAL LOW (ref 135–145)
Total Bilirubin: 3.8 mg/dL — ABNORMAL HIGH (ref 0.3–1.2)
Total Protein: 7.7 g/dL (ref 6.5–8.1)

## 2019-05-06 LAB — CBC
HCT: 38.4 % — ABNORMAL LOW (ref 39.0–52.0)
Hemoglobin: 12.1 g/dL — ABNORMAL LOW (ref 13.0–17.0)
MCH: 33.2 pg (ref 26.0–34.0)
MCHC: 31.5 g/dL (ref 30.0–36.0)
MCV: 105.2 fL — ABNORMAL HIGH (ref 80.0–100.0)
Platelets: 218 10*3/uL (ref 150–400)
RBC: 3.65 MIL/uL — ABNORMAL LOW (ref 4.22–5.81)
RDW: 15.5 % (ref 11.5–15.5)
WBC: 10.4 10*3/uL (ref 4.0–10.5)
nRBC: 0.7 % — ABNORMAL HIGH (ref 0.0–0.2)

## 2019-05-06 LAB — BLOOD GAS, ARTERIAL
Acid-base deficit: 25.2 mmol/L — ABNORMAL HIGH (ref 0.0–2.0)
Bicarbonate: 3.8 mmol/L — ABNORMAL LOW (ref 20.0–28.0)
Drawn by: 560021
FIO2: 32
O2 Saturation: 97.2 %
Patient temperature: 36.7
pCO2 arterial: <19 mmHg — CL (ref 32.0–48.0)
pH, Arterial: 7.028 — CL (ref 7.350–7.450)
pO2, Arterial: 152 mmHg — ABNORMAL HIGH (ref 83.0–108.0)

## 2019-05-06 LAB — URINALYSIS, ROUTINE W REFLEX MICROSCOPIC
Bilirubin Urine: NEGATIVE
Glucose, UA: NEGATIVE mg/dL
Hgb urine dipstick: NEGATIVE
Ketones, ur: NEGATIVE mg/dL
Leukocytes,Ua: NEGATIVE
Nitrite: NEGATIVE
Protein, ur: 100 mg/dL — AB
Specific Gravity, Urine: 1.014 (ref 1.005–1.030)
pH: 5 (ref 5.0–8.0)

## 2019-05-06 LAB — CBG MONITORING, ED
Glucose-Capillary: 43 mg/dL — CL (ref 70–99)
Glucose-Capillary: 72 mg/dL (ref 70–99)
Glucose-Capillary: 73 mg/dL (ref 70–99)
Glucose-Capillary: 75 mg/dL (ref 70–99)
Glucose-Capillary: 80 mg/dL (ref 70–99)
Glucose-Capillary: 87 mg/dL (ref 70–99)
Glucose-Capillary: 97 mg/dL (ref 70–99)
Glucose-Capillary: 98 mg/dL (ref 70–99)

## 2019-05-06 LAB — LIPASE, BLOOD: Lipase: 50 U/L (ref 11–51)

## 2019-05-06 LAB — AMMONIA: Ammonia: 60 umol/L — ABNORMAL HIGH (ref 9–35)

## 2019-05-06 LAB — LACTIC ACID, PLASMA: Lactic Acid, Venous: >11 mmol/L (ref 0.5–1.9)

## 2019-05-06 LAB — RESPIRATORY PANEL BY RT PCR (FLU A&B, COVID)
Influenza A by PCR: NEGATIVE
Influenza B by PCR: NEGATIVE
SARS Coronavirus 2 by RT PCR: NEGATIVE

## 2019-05-06 LAB — ETHANOL: Alcohol, Ethyl (B): 10 mg/dL — ABNORMAL HIGH (ref <10)

## 2019-05-06 LAB — TROPONIN I (HIGH SENSITIVITY)
Troponin I (High Sensitivity): 65 ng/L — ABNORMAL HIGH (ref <18)
Troponin I (High Sensitivity): 54 ng/L — ABNORMAL HIGH (ref <18)

## 2019-05-06 LAB — BRAIN NATRIURETIC PEPTIDE: B Natriuretic Peptide: 1146.6 pg/mL — ABNORMAL HIGH (ref 0.0–100.0)

## 2019-05-06 MED ORDER — SODIUM BICARBONATE 8.4 % IV SOLN
INTRAVENOUS | Status: DC
  Filled 2019-05-06 (×7): qty 150

## 2019-05-06 MED ORDER — DEXTROSE 50 % IV SOLN
50.0000 mL | INTRAVENOUS | Status: DC | PRN
  Administered 2019-05-08: 25 mL via INTRAVENOUS
  Filled 2019-05-06 (×2): qty 50

## 2019-05-06 MED ORDER — HEPARIN SODIUM (PORCINE) 5000 UNIT/ML IJ SOLN
5000.0000 [IU] | Freq: Three times a day (TID) | INTRAMUSCULAR | Status: DC
  Administered 2019-05-07 (×2): 5000 [IU] via SUBCUTANEOUS
  Filled 2019-05-06 (×2): qty 1

## 2019-05-06 MED ORDER — SODIUM CHLORIDE 0.9 % IV BOLUS
1000.0000 mL | Freq: Once | INTRAVENOUS | Status: AC
  Administered 2019-05-06: 1000 mL via INTRAVENOUS

## 2019-05-06 MED ORDER — PIPERACILLIN-TAZOBACTAM 3.375 G IVPB 30 MIN
3.3750 g | Freq: Once | INTRAVENOUS | Status: AC
  Administered 2019-05-06: 3.375 g via INTRAVENOUS
  Filled 2019-05-06: qty 50

## 2019-05-06 MED ORDER — ALBUMIN HUMAN 5 % IV SOLN
12.5000 g | Freq: Once | INTRAVENOUS | Status: DC
  Filled 2019-05-06: qty 250

## 2019-05-06 MED ORDER — SODIUM CHLORIDE 0.9 % IV BOLUS
1000.0000 mL | Freq: Once | INTRAVENOUS | Status: DC
  Administered 2019-05-07: 1000 mL via INTRAVENOUS

## 2019-05-06 MED ORDER — SODIUM BICARBONATE 8.4 % IV SOLN
100.0000 meq | Freq: Once | INTRAVENOUS | Status: AC
  Administered 2019-05-07: 01:00:00 100 meq via INTRAVENOUS
  Filled 2019-05-06: qty 100

## 2019-05-06 MED ORDER — ALBUMIN HUMAN 25 % IV SOLN
12.5000 g | Freq: Once | INTRAVENOUS | Status: AC
  Administered 2019-05-06: 12.5 g via INTRAVENOUS
  Filled 2019-05-06: qty 50

## 2019-05-06 MED ORDER — DEXTROSE 50 % IV SOLN
INTRAVENOUS | Status: AC
  Administered 2019-05-06: 25 g
  Filled 2019-05-06: qty 50

## 2019-05-06 MED ORDER — LACTATED RINGERS IV BOLUS
250.0000 mL | Freq: Once | INTRAVENOUS | Status: AC
  Administered 2019-05-06: 250 mL via INTRAVENOUS

## 2019-05-06 NOTE — ED Triage Notes (Signed)
Pt arrives POV for eval of generalized leg swelling, now moving up to abd. Pt reports swelling x 6 months, worse this week. Endorses associated SOB.

## 2019-05-06 NOTE — ED Notes (Signed)
Pt cautioned not to get out ogf bed he climbed off bed onto a bedside commode  And almost pulled bhis iv out

## 2019-05-06 NOTE — ED Notes (Signed)
Critical care at  The bedside 

## 2019-05-06 NOTE — ED Notes (Signed)
Pt c/o sob although 02 sats are 98-100%

## 2019-05-06 NOTE — ED Notes (Signed)
Pt transported to CT ?

## 2019-05-06 NOTE — ED Notes (Signed)
Pt keeps taking his 02 off

## 2019-05-06 NOTE — Consult Note (Addendum)
NAME:  William Nguyen, MRN:  [id number], DOB:  1970-06-30, LOS: 0 ADMISSION DATE:  04/16/2019, CONSULTATION DATE:  04/06/2019 REFERRING MD:  Ralene Bathe  CHIEF COMPLAINT:  SOB   Brief History   William Nguyen is a 49 y.o. male who presented 3/31 with AKI and profound lactic acidosis due to impaired hepatic clearance and with multiple metabolic derangements.  History of present illness   William Nguyen is a 49 y.o. male who has a PMH including but not limited to hepatic adenoma s/p resection [id number], alcoholic steatohepatitis, HTN, tobacco dependence (see "past medical history" for rest).  He presented to [facility] ED 3/31 with generalized progressive leg swelling x 6 months and mild dyspnea x a few days.  On day of presentation, he also developed central chest and abdominal pain.  No known fevers/chills/sweats, headaches, cough, N/V/D, myalgias, exposures to known sick contacts.  He has a hx of partial right hepatectomy on 06/27/18 for hepatic adenoma (followed at [facility] with plans for MRI surveillance imaging moving forward) and states that he is compliant with all of his medications.  He has a hx of EtOH use but states last drink was in October 2020.  He does smoke cigarettes, 5-6 per day.  In ED, he was found to have multiple metabolic derangements including CO2 < 7, CBG 59, SCR 2.15, transaminitis, BNP 1146, lactate > 11.    Past Medical History  has Pneumonia; Liver mass, right lobe; Hepatic adenoma; Dyspnea on exertion; Tobacco smoker, less than 10 cigarettes per day; Coronary artery calcification seen on CAT scan; Abnormal finding on cardiovascular stress test; Reducible left inguinal hernia; Leg swelling; Hypertension; and Diastolic dysfunction without heart failure on their problem list.  Significant Hospital Events   3/31 > admit.  Consults:  None.  Procedures:  None.  Significant Diagnostic Tests:  CT A / P 3/31 > small right pleural effusion, small volume ascites, 62mm nonobstructing right renal calculus.   No hydronephrosis.  Micro Data:  Flu 3/31 >  COVID 3/31 >  Blood 3/31 >   Antimicrobials:  Zosyn x 1 in ED.  Interim history/subjective:  He appears uncomfortable.  Vitals stable.  Objective:  Blood pressure (!) 115/53, pulse 82, temperature (!) 96.5 F (35.8 C), temperature source Oral, resp. rate (!) 24, height 5\' 10"  (1.778 m), weight 80 kg, SpO2 90 %.       No intake or output data in the 24 hours ending 04/08/2019 2025 Filed Weights   04/21/2019 1638  Weight: 80 kg    Examination: General: Adult male, chronically ill appearing. Neuro: A&O x 3, no deficits.Marland Kitchen HEENT: [place]/AT. Sclerae anicteric.  EOMI. Cardiovascular: RRR, no M/R/G.  Lungs: Respirations even and unlabored.  CTA bilaterally, No W/R/R.  Abdomen: BS x 4, soft, NT/ND.  Musculoskeletal: No gross deformities, 1+ pitting edema up to knees bilaterally. Skin: Intact, warm, no rashes.  Assessment & Plan:   Profound lactic acidosis - unclear etiology for acute rise in lactate but level certainly impacted by acute liver injury resulting in decreased clearance. - Assess ABG now. - 1L NS bolus now then albumin x 2. - 2amps HCO3 now then continue continue bicarb infusion. - Follow BMP q12hrs.  Transaminitis with acute liver injury - unclear etiology. Hx alcoholic steatohepatitis. - Assess APAP and salicylate levels. - F/u on coags. - Trend LFT's. - Ultimately needs transfer to tertiary center with hepatology service. - EtOH cessation counseling.  Hypoglycemia - ? Due to underlying hepatic disease / acute injury. -  Q4hr CBG checks. - D50 PRN for now for CBG < 70, can consider D5 or D10 if persists.  Hx hepatic adenoma - s/p resection May 2020 and followed at [facility]. - F/u as outpatient.  Not much to offer from our standpoint.  Continue supportive care.  Needs transfer to a liver center.  Would recommend [facility] as he has been followed there, but [facility] would also be acceptable if needed.  Addendum 2230: ED attempted  transfer to [facility] hepatology who refused.  Will admit here for now and have day team continue to attempt liver center to accept.  Best Practice:  Diet: NPO. Pain/Anxiety/Delirium protocol (if indicated): N/A. VAP protocol (if indicated): N/A. DVT prophylaxis: SCD's / Heparin. GI prophylaxis: N/A. Glucose control: D50 PRN. Mobility: Bedrest. Code Status: Full. Family Communication: None. Disposition: ICU.  Labs   CBC: Recent Labs  Lab 05/05/2019 1645  WBC 10.4  HGB 12.1*  HCT 38.4*  MCV 105.2*  PLT [id number]   Basic Metabolic Panel: Recent Labs  Lab 05/05/2019 1645  NA 131*  K 4.9  CL 92*  CO2 <7*  GLUCOSE 59*  BUN 36*  CREATININE 2.15*  CALCIUM 9.8   GFR: Estimated Creatinine Clearance: 44.3 mL/min (A) (by C-G formula based on SCr of 2.15 mg/dL (H)). Recent Labs  Lab 04/26/2019 1645 05/02/2019 1752  WBC 10.4  --   LATICACIDVEN  --  >11.0*   Liver Function Tests: Recent Labs  Lab 04/15/2019 1645  AST 679*  ALT 353*  ALKPHOS 139*  BILITOT 3.8*  PROT 7.7  ALBUMIN 4.3   Recent Labs  Lab 04/13/2019 1645  LIPASE 50   Recent Labs  Lab 04/30/2019 1645  AMMONIA 60*   ABG No results found for: PHART, PCO2ART, PO2ART, HCO3, TCO2, ACIDBASEDEF, O2SAT  Coagulation Profile: No results for input(s): INR, PROTIME in the last 168 hours. Cardiac Enzymes: No results for input(s): CKTOTAL, CKMB, CKMBINDEX, TROPONINI in the last 168 hours. HbA1C: Hgb A1c MFr Bld  Date/Time Value Ref Range Status  02/18/2019 10:31 AM 4.9 4.8 - 5.6 % Final    Comment:             Prediabetes: 5.7 - 6.4          Diabetes: >6.4          Glycemic control for adults with diabetes: <7.0    CBG: Recent Labs  Lab 05/03/2019 1815 04/07/2019 1830 04/22/2019 1854 04/26/2019 1934  GLUCAP 43* 80 98 97    Review of Systems:   All negative; except for those that are bolded, which indicate positives.  Constitutional: weight loss, weight gain, night sweats, fevers, chills, fatigue, weakness.  HEENT:  headaches, sore throat, sneezing, nasal congestion, post nasal drip, difficulty swallowing, tooth/dental problems, visual complaints, visual changes, ear aches. Neuro: difficulty with speech, weakness, numbness, ataxia. CV:  chest pain, orthopnea, PND, swelling in lower extremities, dizziness, palpitations, syncope.  Resp: cough, hemoptysis, dyspnea, wheezing. GI: heartburn, indigestion, abdominal pain, nausea, vomiting, diarrhea, constipation, change in bowel habits, loss of appetite, hematemesis, melena, hematochezia.  GU: dysuria, change in color of urine, urgency or frequency, flank pain, hematuria. MSK: joint pain or swelling, decreased range of motion. Psych: change in mood or affect, depression, anxiety, suicidal ideations, homicidal ideations. Skin: rash, itching, bruising.   Past medical history  He,  has a past medical history of Alcohol abuse (10/18/2017), Cocaine abuse ([place]), Fracture of angle of right mandible, initial encounter for closed fracture ([place]), GERD (gastroesophageal reflux disease), Hypertension, Liver mass,  right lobe, Pneumonia (03/2016), Reducible left inguinal hernia, Smoking, and Ulcer.   Surgical History    Past Surgical History:  Procedure Laterality Date  . CardioPulmonary Exercise Test: CPX  04/2016   Exercise testing with gas exchange shows a moderate functional limitation due to a circulatory limitation -- Peak VO2 53%.  There were no ST-T wave changes with exercise. Resting spirometry suggests mild restriction. Hypertensive response to exercise.  . hand surgery Right   . INGUINAL HERNIA REPAIR Left 02/17/2018   Procedure: OPEN HERNIA REPAIR INGUINAL ADULT;  Surgeon: Olean Ree, MD;  Location: [facility] ORS;  Service: General;  Laterality: Left;     Social History   reports that he has been smoking cigarettes. He has a 12.50 pack-year smoking history. He has never used smokeless tobacco. He reports previous alcohol use of about 2.0 standard drinks of alcohol  per week. He reports previous drug use.   Family history   His family history includes Healthy in his brother, father, mother, and sister; Heart disease in his paternal grandmother.   Allergies Allergies  Allergen Reactions  . Vicodin [Hydrocodone-Acetaminophen] Nausea And Vomiting     Home meds  Prior to Admission medications   Medication Sig Start Date End Date Taking? Authorizing Provider  albuterol (VENTOLIN HFA) 108 (90 Base) MCG/ACT inhaler INHALE 2 PUFFS INTO THE LUNGS EVERY 6 (SIX) HOURS AS NEEDED FOR WHEEZING OR SHORTNESS OF BREATH. 04/14/19   William Pounds, NP  allopurinol (ZYLOPRIM) 100 MG tablet Take 2 tablets (200 mg total) by mouth daily. Patient taking differently: Take 100 mg by mouth daily.  07/24/18 04/03/19  William Pounds, NP  amLODipine (NORVASC) 5 MG tablet Take 1 tablet (5 mg total) by mouth daily. 02/17/19 05/18/19  William Pounds, NP  Coenzyme Q10 (CO Q-10) 100 MG CAPS Take 100 mg by mouth daily.    [provider]  furosemide (LASIX) 20 MG tablet Take 1 tablet (20 mg total) by mouth daily. 04/22/19 05/22/19  William Pounds, NP  ibuprofen (ADVIL,MOTRIN) 600 MG tablet Take 1 tablet (600 mg total) by mouth every 8 (eight) hours as needed for fever, mild pain or moderate pain. 02/17/18   Olean Ree, MD  omeprazole (PRILOSEC) 20 MG capsule Take 1 capsule (20 mg total) by mouth 2 (two) times daily before a meal. 04/22/19 07/21/19  William Pounds, NP    Critical care time: 40 min.    [name], [facility] 04/06/2019, 8:25 PM

## 2019-05-06 NOTE — ED Provider Notes (Signed)
[facility] EMERGENCY DEPARTMENT Provider Note   CSN: [id number] Arrival date & time: 04/27/2019  1624     History Chief Complaint  Patient presents with  . Leg Swelling  . Shortness of Breath    William Nguyen is a 49 y.o. male.  mud  The history is provided by the patient and medical records. No language interpreter was used.  Shortness of Breath  William Nguyen is a 49 y.o. male who presents to the Emergency Department complaining of sob, leg swelling. He presents the emergency department from home for evaluation of six months of progressive lower extremity edema as well as several days of shortness of breath. Today he developed central chest and abdominal pain. He denies any fevers, cough, nausea, vomiting, diarrhea. He states that he is having difficulty producing any urine. He lives at home alone. He does smoke a few cigarettes. He has a history of alcohol abuse but last drank in October. He denies any street drug use. He has a history of partial liver resection. He has been compliant with all his home medications. He is not using any over-the-counter medications.    Past Medical History:  Diagnosis Date  . Alcohol abuse 10/18/2017   drinks a 6 pack daily  . Cocaine abuse ([place])   . Fracture of angle of right mandible, initial encounter for closed fracture ([place])   . GERD (gastroesophageal reflux disease)    h/o  . Hypertension   . 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 Pneumonia 03/2016  . Reducible left inguinal hernia   . Smoking   . Ulcer    gastric ulcers with bleeding episode    Patient Active Problem List   Diagnosis Date Noted  . Lactic acidosis 04/30/2019  . Elevated lactic acid level   . High anion gap metabolic acidosis   . AKI (acute kidney injury) ([place])   . Acute hypoxemic respiratory failure ([place])   . Transaminitis   . Alcoholic steatohepatitis   . Acute on chronic heart failure with preserved ejection  fraction ([place])   . H/O ETOH abuse   . Tobacco dependence   . Leg swelling 03/04/2019  . Hypertension 03/04/2019  . Diastolic dysfunction without heart failure 03/04/2019  . Reducible left inguinal hernia 10/18/2017  . Abnormal finding on cardiovascular stress test 05/14/2016  . Dyspnea on exertion 04/16/2016  . Tobacco smoker, less than 10 cigarettes per day 04/16/2016  . Coronary artery calcification seen on CAT scan 04/16/2016  . 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 Pneumonia 03/17/2016  . Liver mass, right lobe 03/17/2016    Past Surgical History:  Procedure Laterality Date  . CardioPulmonary Exercise Test: CPX  04/2016   Exercise testing with gas exchange shows a moderate functional limitation due to a circulatory limitation -- Peak VO2 53%.  There were no ST-T wave changes with exercise. Resting spirometry suggests mild restriction. Hypertensive response to exercise.  . hand surgery Right   . INGUINAL HERNIA REPAIR Left 02/17/2018   Procedure: OPEN HERNIA REPAIR INGUINAL ADULT;  Surgeon: Olean Ree, MD;  Location: [facility] ORS;  Service: General;  Laterality: Left;       Family History  Problem Relation Age of Onset  . Healthy Mother   . Healthy Father   . Healthy Sister   . Healthy Brother   . Heart disease Paternal Grandmother        Unsure of details - still living at age  Social History   Tobacco Use  . Smoking status: Current Every Day Smoker    Packs/day: 0.50    Years: 25.00    Pack years: 12.50    Types: Cigarettes  . Smokeless tobacco: Never Used  Substance Use Topics  . Alcohol use: Not Currently    Alcohol/week: 2.0 standard drinks    Types: 2 Cans of beer per week    Comment: heavier in past, for the past 20 years, but now 12 on weekends  . Drug use: Not Currently    Comment: + UDS FOR COCAINE IN 2103     Home Medications Prior to Admission medications   Medication Sig Start Date End Date Taking? Authorizing Provider  albuterol (VENTOLIN HFA) 108  (90 Base) MCG/ACT inhaler INHALE 2 PUFFS INTO THE LUNGS EVERY 6 (SIX) HOURS AS NEEDED FOR WHEEZING OR SHORTNESS OF BREATH. 04/14/19   Gildardo Pounds, NP  allopurinol (ZYLOPRIM) 100 MG tablet Take 2 tablets (200 mg total) by mouth daily. Patient taking differently: Take 100 mg by mouth daily.  07/24/18 04/03/19  Gildardo Pounds, NP  amLODipine (NORVASC) 5 MG tablet Take 1 tablet (5 mg total) by mouth daily. 02/17/19 05/18/19  Gildardo Pounds, NP  Coenzyme Q10 (CO Q-10) 100 MG CAPS Take 100 mg by mouth daily.    [provider]  furosemide (LASIX) 20 MG tablet Take 1 tablet (20 mg total) by mouth daily. 04/22/19 05/22/19  Gildardo Pounds, NP  ibuprofen (ADVIL,MOTRIN) 600 MG tablet Take 1 tablet (600 mg total) by mouth every 8 (eight) hours as needed for fever, mild pain or moderate pain. 02/17/18   Olean Ree, MD  omeprazole (PRILOSEC) 20 MG capsule Take 1 capsule (20 mg total) by mouth 2 (two) times daily before a meal. 04/22/19 07/21/19  Gildardo Pounds, NP    Allergies    Vicodin [hydrocodone-acetaminophen]  Review of Systems   Review of Systems  Respiratory: Positive for shortness of breath.   All other systems reviewed and are negative.   Physical Exam Updated Vital Signs BP (!) 99/51   Pulse 77   Temp (!) 96.5 F (35.8 C) (Oral)   Resp (!) 25   Ht 5\' 10"  (1.778 m)   Wt 80 kg   SpO2 94%   BMI 25.31 kg/m   Physical Exam Vitals and nursing note reviewed.  Constitutional:      General: He is in acute distress.     Appearance: He is well-developed. He is ill-appearing.  HENT:     Head: Normocephalic and atraumatic.  Cardiovascular:     Rate and Rhythm: Normal rate and regular rhythm.     Heart sounds: No murmur.  Pulmonary:     Effort: Pulmonary effort is normal. No respiratory distress.     Comments: Tachypnea. Good air movement bilaterally. Abdominal:     Palpations: Abdomen is soft.     Tenderness: There is no abdominal tenderness. There is no guarding or  rebound.  Musculoskeletal:        General: No tenderness.     Comments: 2 to 3+ pitting edema to bilateral lower extremities  Skin:    General: Skin is warm and dry.     Coloration: Skin is pale.  Neurological:     Mental Status: He is alert and oriented to person, place, and time.  Psychiatric:        Behavior: Behavior normal.     ED Results / Procedures / Treatments   Labs (  all labs ordered are listed, but only abnormal results are displayed) Labs Reviewed  COMPREHENSIVE METABOLIC PANEL - Abnormal; Notable for the following components:      Result Value   Sodium 131 (*)    Chloride 92 (*)    CO2 <7 (*)    Glucose, Bld 59 (*)    BUN 36 (*)    Creatinine, Ser 2.15 (*)    AST 679 (*)    ALT 353 (*)    Alkaline Phosphatase 139 (*)    Total Bilirubin 3.8 (*)    GFR calc non Af Amer 36 (*)    GFR calc Af Amer 41 (*)    All other components within normal limits  CBC - Abnormal; Notable for the following components:   RBC 3.65 (*)    Hemoglobin 12.1 (*)    HCT 38.4 (*)    MCV 105.2 (*)    nRBC 0.7 (*)    All other components within normal limits  URINALYSIS, ROUTINE W REFLEX MICROSCOPIC - Abnormal; Notable for the following components:   Color, Urine AMBER (*)    APPearance HAZY (*)    Protein, ur 100 (*)    Bacteria, UA RARE (*)    All other components within normal limits  AMMONIA - Abnormal; Notable for the following components:   Ammonia 60 (*)    All other components within normal limits  BRAIN NATRIURETIC PEPTIDE - Abnormal; Notable for the following components:   B Natriuretic Peptide 1,146.6 (*)    All other components within normal limits  ETHANOL - Abnormal; Notable for the following components:   Alcohol, Ethyl (B) 10 (*)    All other components within normal limits  LACTIC ACID, PLASMA - Abnormal; Notable for the following components:   Lactic Acid, Venous >11.0 (*)    All other components within normal limits  PROTIME-INR - Abnormal; Notable for the  following components:   Prothrombin Time 25.6 (*)    INR 2.4 (*)    All other components within normal limits  BLOOD GAS, ARTERIAL - Abnormal; Notable for the following components:   pH, Arterial 7.028 (*)    pCO2 arterial <19.0 (*)    pO2, Arterial 152 (*)    Bicarbonate 3.8 (*)    Acid-base deficit 25.2 (*)    All other components within normal limits  CBG MONITORING, ED - Abnormal; Notable for the following components:   Glucose-Capillary 43 (*)    All other components within normal limits  TROPONIN I (HIGH SENSITIVITY) - Abnormal; Notable for the following components:   Troponin I (High Sensitivity) 65 (*)    All other components within normal limits  TROPONIN I (HIGH SENSITIVITY) - Abnormal; Notable for the following components:   Troponin I (High Sensitivity) 54 (*)    All other components within normal limits  RESPIRATORY PANEL BY RT PCR (FLU A&B, COVID)  CULTURE, BLOOD (ROUTINE X 2)  CULTURE, BLOOD (ROUTINE X 2)  LIPASE, BLOOD  ACETAMINOPHEN LEVEL  SALICYLATE LEVEL  LACTIC ACID, PLASMA  BASIC METABOLIC PANEL  HEPATIC FUNCTION PANEL  HIV ANTIBODY (ROUTINE TESTING W REFLEX)  CBC  MAGNESIUM  PHOSPHORUS  RAPID URINE DRUG SCREEN, HOSP PERFORMED  BASIC METABOLIC PANEL  BASIC METABOLIC PANEL  I-STAT VENOUS BLOOD GAS, ED  CBG MONITORING, ED  CBG MONITORING, ED  CBG MONITORING, ED  CBG MONITORING, ED  CBG MONITORING, ED  CBG MONITORING, ED  CBG MONITORING, ED    EKG EKG Interpretation  Date/Time:  Wednesday  May 06 2019 17:44:02 EDT Ventricular Rate:  82 PR Interval:    QRS Duration: 115 QT Interval:  425 QTC Calculation: 497 R Axis:   86 Text Interpretation: Sinus rhythm Probable left atrial enlargement Incomplete right bundle branch block Confirmed by Quintella Reichert [phone]) on 04/22/2019 5:51:55 PM   Radiology CT Abdomen Pelvis Wo Contrast  Result Date: 05/03/2019 CLINICAL DATA:  Elevated lateral sees, abdominal pain, acute renal failure, generalized  leg swelling x6 months EXAM: CT ABDOMEN AND PELVIS WITHOUT CONTRAST TECHNIQUE: Multidetector CT imaging of the abdomen and pelvis was performed following the standard protocol without IV contrast. COMPARISON:  04/01/2018 FINDINGS: Lower chest: Small right and trace left pleural effusions. Hepatobiliary: Postsurgical changes related to central right liver resection (series 3/image 32). Prior cholecystectomy. No intrahepatic or extrahepatic ductal dilatation. Pancreas: Within normal limits. Spleen: Within normal limits. Adrenals/Urinary Tract: Adrenal glands are within normal limits. 4 mm nonobstructing right renal calculus (series 3/image 37). Left kidney is within normal limits. No hydronephrosis. Thick-walled bladder, although underdistended. Stomach/Bowel: Stomach is within normal limits. No evidence of bowel obstruction. Appendix is not discretely visualized. Vascular/Lymphatic: No evidence of abdominal aortic aneurysm. Atherosclerotic calcifications of the abdominal aorta and branch vessels. No suspicious abdominopelvic lymphadenopathy. Reproductive: Prostate is unremarkable. Other: Small volume pelvic ascites. Mild body wall edema. Musculoskeletal: Visualized osseous structures are within normal limits. IMPRESSION: Postsurgical changes related to central right liver resection and cholecystectomy. Small right pleural effusion. Small volume pelvic ascites. Mild body wall edema. 4 mm nonobstructing right renal calculus.  No hydronephrosis. Additional ancillary findings as above. Electronically Signed   By: Julian Hy M.D.   On: 05/05/2019 19:42   DG Chest Port 1 View  Result Date: 04/12/2019 CLINICAL DATA:  Shortness of breath and chest pain EXAM: PORTABLE CHEST 1 VIEW COMPARISON:  04/03/2019 FINDINGS: Cardiomegaly with mild central vascular congestion. No focal consolidation, pleural effusion, or pneumothorax. IMPRESSION: Cardiomegaly with mild central congestion Electronically Signed   By: Donavan Foil M.D.   On: 04/08/2019 18:11    Procedures Procedures (including critical care time) CRITICAL CARE Performed by: Quintella Reichert   Total critical care time: 45 minutes  Critical care time was exclusive of separately billable procedures and treating other patients.  Critical care was necessary to treat or prevent imminent or life-threatening deterioration.  Critical care was time spent personally by me on the following activities: development of treatment plan with patient and/or surrogate as well as nursing, discussions with consultants, evaluation of patient's response to treatment, examination of patient, obtaining history from patient or surrogate, ordering and performing treatments and interventions, ordering and review of laboratory studies, ordering and review of radiographic studies, pulse oximetry and re-evaluation of patient's condition.  Medications Ordered in ED Medications  sodium bicarbonate 150 mEq in dextrose 5 % 1,000 mL infusion ( Intravenous New Bag/Given 04/28/2019 2106)  albumin human 5 % solution 12.5 g ( Intravenous Canceled Entry 04/06/2019 2329)  dextrose 50 % solution 50 mL (has no administration in time range)  heparin injection 5,000 Units (has no administration in time range)  sodium bicarbonate injection 100 mEq (has no administration in time range)  dextrose 50 % solution (25 g  Given 04/17/2019 1818)  lactated ringers bolus 250 mL (0 mLs Intravenous Stopped 04/23/2019 1928)  piperacillin-tazobactam (ZOSYN) IVPB 3.375 g (0 g Intravenous Stopped 04/10/2019 2048)  sodium chloride 0.9 % bolus 1,000 mL (0 mLs Intravenous Stopped 04/20/2019 2238)  albumin human 25 % solution 12.5 g (0 g Intravenous Stopped 04/25/2019  2327)    ED Course  I have reviewed the triage vital signs and the nursing notes.  Pertinent labs & imaging results that were available during my care of the patient were reviewed by me and considered in my medical decision making (see chart for details).     MDM Rules/Calculators/A&P                     Pt here for evaluation of progressive sob and lower extremity edema.  He is ill appearing on exam.  Labs significant for profound metabolic acidosis and markedly elevated lactate.  No current evidence of sepsis.  Elevated lactate likely secondary to liver disease.  He was treated with antibiotics for possible occult infection pending further work up.  Labs also significant for AKI, elevation in transaminases and BNP.  He was started on D5 bicarb drip for metabolic acidosis.  Critical care consulted for admission.   Critical care recommends discussion with hepatology at [facility] or [facility] regarding transfer due to advanced liver disease.   D/w [facility] hepatologist on call - feels that liver disease is not driving current illness and source of profound acidosis, declines to accept patient.   Discussed with critical care - will admit for further treatment.    Final Clinical Impression(s) / ED Diagnoses Final diagnoses:  Metabolic acidosis  AKI (acute kidney injury) [facility])    Rx / [employer] Orders ED Discharge Orders    None       Quintella Reichert, MD 05/07/19 0010

## 2019-05-06 NOTE — ED Notes (Signed)
RN rechecked CBG, 80, after 1 amp D50.

## 2019-05-06 NOTE — ED Notes (Signed)
Pt has been drinking water n ow hes nauseated

## 2019-05-06 NOTE — ED Notes (Signed)
Asking for water

## 2019-05-07 ENCOUNTER — Inpatient Hospital Stay (HOSPITAL_COMMUNITY): Payer: Self-pay

## 2019-05-07 DIAGNOSIS — N179 Acute kidney failure, unspecified: Secondary | ICD-10-CM

## 2019-05-07 DIAGNOSIS — I361 Nonrheumatic tricuspid (valve) insufficiency: Secondary | ICD-10-CM

## 2019-05-07 DIAGNOSIS — Z452 Encounter for adjustment and management of vascular access device: Secondary | ICD-10-CM

## 2019-05-07 DIAGNOSIS — Z978 Presence of other specified devices: Secondary | ICD-10-CM

## 2019-05-07 DIAGNOSIS — K7201 Acute and subacute hepatic failure with coma: Secondary | ICD-10-CM

## 2019-05-07 DIAGNOSIS — J9602 Acute respiratory failure with hypercapnia: Secondary | ICD-10-CM

## 2019-05-07 DIAGNOSIS — K769 Liver disease, unspecified: Secondary | ICD-10-CM

## 2019-05-07 DIAGNOSIS — J9601 Acute respiratory failure with hypoxia: Secondary | ICD-10-CM

## 2019-05-07 LAB — BASIC METABOLIC PANEL
Anion gap: 44 — ABNORMAL HIGH (ref 5–15)
Anion gap: 41 — ABNORMAL HIGH (ref 5–15)
BUN: 36 mg/dL — ABNORMAL HIGH (ref 6–20)
BUN: 37 mg/dL — ABNORMAL HIGH (ref 6–20)
BUN: 32 mg/dL — ABNORMAL HIGH (ref 6–20)
CO2: <7 mmol/L — ABNORMAL LOW (ref 22–32)
CO2: 8 mmol/L — ABNORMAL LOW (ref 22–32)
CO2: 9 mmol/L — ABNORMAL LOW (ref 22–32)
Calcium: 8.9 mg/dL (ref 8.9–10.3)
Calcium: 8 mg/dL — ABNORMAL LOW (ref 8.9–10.3)
Calcium: 7 mg/dL — ABNORMAL LOW (ref 8.9–10.3)
Chloride: 94 mmol/L — ABNORMAL LOW (ref 98–111)
Chloride: 89 mmol/L — ABNORMAL LOW (ref 98–111)
Chloride: 88 mmol/L — ABNORMAL LOW (ref 98–111)
Creatinine, Ser: 2.27 mg/dL — ABNORMAL HIGH (ref 0.61–1.24)
Creatinine, Ser: 2.3 mg/dL — ABNORMAL HIGH (ref 0.61–1.24)
Creatinine, Ser: 2.21 mg/dL — ABNORMAL HIGH (ref 0.61–1.24)
GFR calc Af Amer: 39 mL/min — ABNORMAL LOW (ref >60)
GFR calc Af Amer: 38 mL/min — ABNORMAL LOW (ref >60)
GFR calc Af Amer: 40 mL/min — ABNORMAL LOW (ref >60)
GFR calc non Af Amer: 33 mL/min — ABNORMAL LOW (ref >60)
GFR calc non Af Amer: 33 mL/min — ABNORMAL LOW (ref >60)
GFR calc non Af Amer: 34 mL/min — ABNORMAL LOW (ref >60)
Glucose, Bld: 87 mg/dL (ref 70–99)
Glucose, Bld: 141 mg/dL — ABNORMAL HIGH (ref 70–99)
Glucose, Bld: 108 mg/dL — ABNORMAL HIGH (ref 70–99)
Potassium: 5.4 mmol/L — ABNORMAL HIGH (ref 3.5–5.1)
Potassium: 4.3 mmol/L (ref 3.5–5.1)
Potassium: 4.5 mmol/L (ref 3.5–5.1)
Sodium: 133 mmol/L — ABNORMAL LOW (ref 135–145)
Sodium: 141 mmol/L (ref 135–145)
Sodium: 138 mmol/L (ref 135–145)

## 2019-05-07 LAB — LACTIC ACID, PLASMA
Lactic Acid, Venous: >11 mmol/L (ref 0.5–1.9)
Lactic Acid, Venous: >11 mmol/L (ref 0.5–1.9)
Lactic Acid, Venous: >11 mmol/L (ref 0.5–1.9)

## 2019-05-07 LAB — POCT I-STAT 7, (LYTES, BLD GAS, ICA,H+H)
Acid-base deficit: 26 mmol/L — ABNORMAL HIGH (ref 0.0–2.0)
Acid-base deficit: 24 mmol/L — ABNORMAL HIGH (ref 0.0–2.0)
Acid-base deficit: 19 mmol/L — ABNORMAL HIGH (ref 0.0–2.0)
Acid-base deficit: 20 mmol/L — ABNORMAL HIGH (ref 0.0–2.0)
Acid-base deficit: 19 mmol/L — ABNORMAL HIGH (ref 0.0–2.0)
Bicarbonate: 4.3 mmol/L — ABNORMAL LOW (ref 20.0–28.0)
Bicarbonate: 5.8 mmol/L — ABNORMAL LOW (ref 20.0–28.0)
Bicarbonate: 8.3 mmol/L — ABNORMAL LOW (ref 20.0–28.0)
Bicarbonate: 8.4 mmol/L — ABNORMAL LOW (ref 20.0–28.0)
Bicarbonate: 8.4 mmol/L — ABNORMAL LOW (ref 20.0–28.0)
Calcium, Ion: 1.04 mmol/L — ABNORMAL LOW (ref 1.15–1.40)
Calcium, Ion: 1 mmol/L — ABNORMAL LOW (ref 1.15–1.40)
Calcium, Ion: 0.92 mmol/L — ABNORMAL LOW (ref 1.15–1.40)
Calcium, Ion: 0.87 mmol/L — CL (ref 1.15–1.40)
Calcium, Ion: 0.86 mmol/L — CL (ref 1.15–1.40)
HCT: 35 % — ABNORMAL LOW (ref 39.0–52.0)
HCT: 36 % — ABNORMAL LOW (ref 39.0–52.0)
HCT: 36 % — ABNORMAL LOW (ref 39.0–52.0)
HCT: 37 % — ABNORMAL LOW (ref 39.0–52.0)
HCT: 34 % — ABNORMAL LOW (ref 39.0–52.0)
Hemoglobin: 11.9 g/dL — ABNORMAL LOW (ref 13.0–17.0)
Hemoglobin: 12.2 g/dL — ABNORMAL LOW (ref 13.0–17.0)
Hemoglobin: 12.2 g/dL — ABNORMAL LOW (ref 13.0–17.0)
Hemoglobin: 12.6 g/dL — ABNORMAL LOW (ref 13.0–17.0)
Hemoglobin: 11.6 g/dL — ABNORMAL LOW (ref 13.0–17.0)
O2 Saturation: 92 %
O2 Saturation: 92 %
O2 Saturation: 100 %
O2 Saturation: 97 %
O2 Saturation: 96 %
Patient temperature: 97.7
Patient temperature: 97.7
Patient temperature: 93
Patient temperature: 94.2
Potassium: 4.9 mmol/L (ref 3.5–5.1)
Potassium: 4.4 mmol/L (ref 3.5–5.1)
Potassium: 4.1 mmol/L (ref 3.5–5.1)
Potassium: 4 mmol/L (ref 3.5–5.1)
Potassium: 4.4 mmol/L (ref 3.5–5.1)
Sodium: 131 mmol/L — ABNORMAL LOW (ref 135–145)
Sodium: 135 mmol/L (ref 135–145)
Sodium: 135 mmol/L (ref 135–145)
Sodium: 137 mmol/L (ref 135–145)
Sodium: 134 mmol/L — ABNORMAL LOW (ref 135–145)
TCO2: <5 mmol/L — ABNORMAL LOW (ref 22–32)
TCO2: 7 mmol/L — ABNORMAL LOW (ref 22–32)
TCO2: 9 mmol/L — ABNORMAL LOW (ref 22–32)
TCO2: 9 mmol/L — ABNORMAL LOW (ref 22–32)
TCO2: 9 mmol/L — ABNORMAL LOW (ref 22–32)
pCO2 arterial: 17.5 mmHg — CL (ref 32.0–48.0)
pCO2 arterial: 23.3 mmHg — ABNORMAL LOW (ref 32.0–48.0)
pCO2 arterial: 24.7 mmHg — ABNORMAL LOW (ref 32.0–48.0)
pCO2 arterial: 23 mmHg — ABNORMAL LOW (ref 32.0–48.0)
pCO2 arterial: 22.9 mmHg — ABNORMAL LOW (ref 32.0–48.0)
pH, Arterial: 6.993 — CL (ref 7.350–7.450)
pH, Arterial: 7.003 — CL (ref 7.350–7.450)
pH, Arterial: 7.132 — CL (ref 7.350–7.450)
pH, Arterial: 7.153 — CL (ref 7.350–7.450)
pH, Arterial: 7.157 — CL (ref 7.350–7.450)
pO2, Arterial: 93 mmHg (ref 83.0–108.0)
pO2, Arterial: 91 mmHg (ref 83.0–108.0)
pO2, Arterial: 301 mmHg — ABNORMAL HIGH (ref 83.0–108.0)
pO2, Arterial: 102 mmHg (ref 83.0–108.0)
pO2, Arterial: 92 mmHg (ref 83.0–108.0)

## 2019-05-07 LAB — CBC
HCT: 35.4 % — ABNORMAL LOW (ref 39.0–52.0)
Hemoglobin: 11.1 g/dL — ABNORMAL LOW (ref 13.0–17.0)
MCH: 33.5 pg (ref 26.0–34.0)
MCHC: 31.4 g/dL (ref 30.0–36.0)
MCV: 106.9 fL — ABNORMAL HIGH (ref 80.0–100.0)
Platelets: 172 10*3/uL (ref 150–400)
RBC: 3.31 MIL/uL — ABNORMAL LOW (ref 4.22–5.81)
RDW: 15.6 % — ABNORMAL HIGH (ref 11.5–15.5)
WBC: 10.9 10*3/uL — ABNORMAL HIGH (ref 4.0–10.5)
nRBC: 0.9 % — ABNORMAL HIGH (ref 0.0–0.2)

## 2019-05-07 LAB — HEPATITIS PANEL, ACUTE
HCV Ab: NONREACTIVE
Hep A IgM: NONREACTIVE
Hep B C IgM: NONREACTIVE
Hepatitis B Surface Ag: NONREACTIVE

## 2019-05-07 LAB — RENAL FUNCTION PANEL
Albumin: 3.6 g/dL (ref 3.5–5.0)
Albumin: 3.8 g/dL (ref 3.5–5.0)
Anion gap: 45 — ABNORMAL HIGH (ref 5–15)
Anion gap: 39 — ABNORMAL HIGH (ref 5–15)
BUN: 39 mg/dL — ABNORMAL HIGH (ref 6–20)
BUN: 38 mg/dL — ABNORMAL HIGH (ref 6–20)
CO2: 8 mmol/L — ABNORMAL LOW (ref 22–32)
CO2: 8 mmol/L — ABNORMAL LOW (ref 22–32)
Calcium: 8.3 mg/dL — ABNORMAL LOW (ref 8.9–10.3)
Calcium: 7.7 mg/dL — ABNORMAL LOW (ref 8.9–10.3)
Chloride: 89 mmol/L — ABNORMAL LOW (ref 98–111)
Chloride: 92 mmol/L — ABNORMAL LOW (ref 98–111)
Creatinine, Ser: 2.56 mg/dL — ABNORMAL HIGH (ref 0.61–1.24)
Creatinine, Ser: 2.56 mg/dL — ABNORMAL HIGH (ref 0.61–1.24)
GFR calc Af Amer: 33 mL/min — ABNORMAL LOW (ref >60)
GFR calc Af Amer: 33 mL/min — ABNORMAL LOW (ref >60)
GFR calc non Af Amer: 29 mL/min — ABNORMAL LOW (ref >60)
GFR calc non Af Amer: 29 mL/min — ABNORMAL LOW (ref >60)
Glucose, Bld: 95 mg/dL (ref 70–99)
Glucose, Bld: 151 mg/dL — ABNORMAL HIGH (ref 70–99)
Phosphorus: 9.1 mg/dL — ABNORMAL HIGH (ref 2.5–4.6)
Phosphorus: 9.2 mg/dL — ABNORMAL HIGH (ref 2.5–4.6)
Potassium: 4.2 mmol/L (ref 3.5–5.1)
Potassium: 4.5 mmol/L (ref 3.5–5.1)
Sodium: 142 mmol/L (ref 135–145)
Sodium: 139 mmol/L (ref 135–145)

## 2019-05-07 LAB — HEPATIC FUNCTION PANEL
ALT: 164 U/L — ABNORMAL HIGH (ref 0–44)
AST: 770 U/L — ABNORMAL HIGH (ref 15–41)
Albumin: 3.7 g/dL (ref 3.5–5.0)
Alkaline Phosphatase: 109 U/L (ref 38–126)
Bilirubin, Direct: 1.1 mg/dL — ABNORMAL HIGH (ref 0.0–0.2)
Indirect Bilirubin: 2 mg/dL — ABNORMAL HIGH (ref 0.3–0.9)
Total Bilirubin: 3.1 mg/dL — ABNORMAL HIGH (ref 0.3–1.2)
Total Protein: 6.5 g/dL (ref 6.5–8.1)

## 2019-05-07 LAB — ACETAMINOPHEN LEVEL: Acetaminophen (Tylenol), Serum: <10 ug/mL — ABNORMAL LOW (ref 10–30)

## 2019-05-07 LAB — AMMONIA: Ammonia: 81 umol/L — ABNORMAL HIGH (ref 9–35)

## 2019-05-07 LAB — MAGNESIUM: Magnesium: 2.3 mg/dL (ref 1.7–2.4)

## 2019-05-07 LAB — MRSA PCR SCREENING: MRSA by PCR: NEGATIVE

## 2019-05-07 LAB — GLUCOSE, CAPILLARY
Glucose-Capillary: 107 mg/dL — ABNORMAL HIGH (ref 70–99)
Glucose-Capillary: 123 mg/dL — ABNORMAL HIGH (ref 70–99)
Glucose-Capillary: 138 mg/dL — ABNORMAL HIGH (ref 70–99)
Glucose-Capillary: 148 mg/dL — ABNORMAL HIGH (ref 70–99)
Glucose-Capillary: 77 mg/dL (ref 70–99)
Glucose-Capillary: 95 mg/dL (ref 70–99)

## 2019-05-07 LAB — PROTIME-INR
INR: 2.4 — ABNORMAL HIGH (ref 0.8–1.2)
Prothrombin Time: 25.6 seconds — ABNORMAL HIGH (ref 11.4–15.2)

## 2019-05-07 LAB — POCT ACTIVATED CLOTTING TIME
Activated Clotting Time: 131 s
Activated Clotting Time: 147 seconds
Activated Clotting Time: 158 seconds
Activated Clotting Time: 164 seconds
Activated Clotting Time: 164 seconds
Activated Clotting Time: 169 seconds
Activated Clotting Time: 175 s

## 2019-05-07 LAB — RETICULOCYTES
Immature Retic Fract: 29.3 % — ABNORMAL HIGH (ref 2.3–15.9)
RBC.: 3.33 MIL/uL — ABNORMAL LOW (ref 4.22–5.81)
Retic Count, Absolute: 128.9 10*3/uL (ref 19.0–186.0)
Retic Ct Pct: 3.9 % — ABNORMAL HIGH (ref 0.4–3.1)

## 2019-05-07 LAB — SALICYLATE LEVEL: Salicylate Lvl: 7.9 mg/dL (ref 7.0–30.0)

## 2019-05-07 LAB — TYPE AND SCREEN
ABO/RH(D): B POS
Antibody Screen: NEGATIVE

## 2019-05-07 LAB — RAPID URINE DRUG SCREEN, HOSP PERFORMED
Amphetamines: NOT DETECTED
Barbiturates: NOT DETECTED
Benzodiazepines: NOT DETECTED
Cocaine: NOT DETECTED
Opiates: NOT DETECTED
Tetrahydrocannabinol: NOT DETECTED

## 2019-05-07 LAB — VITAMIN B12: Vitamin B-12: 1996 pg/mL — ABNORMAL HIGH (ref 180–914)

## 2019-05-07 LAB — IRON AND TIBC
Iron: 95 ug/dL (ref 45–182)
Saturation Ratios: 34 % (ref 17.9–39.5)
TIBC: 283 ug/dL (ref 250–450)
UIBC: 188 ug/dL

## 2019-05-07 LAB — ECHOCARDIOGRAM LIMITED
Height: 70 in
Weight: 2941.82 oz

## 2019-05-07 LAB — FERRITIN: Ferritin: 464 ng/mL — ABNORMAL HIGH (ref 24–336)

## 2019-05-07 LAB — ABO/RH: ABO/RH(D): B POS

## 2019-05-07 LAB — FOLATE: Folate: 12.5 ng/mL (ref >5.9)

## 2019-05-07 LAB — PHOSPHORUS: Phosphorus: 9.2 mg/dL — ABNORMAL HIGH (ref 2.5–4.6)

## 2019-05-07 LAB — HIV ANTIBODY (ROUTINE TESTING W REFLEX): HIV Screen 4th Generation wRfx: NONREACTIVE

## 2019-05-07 MED ORDER — SODIUM CHLORIDE 0.9 % FOR CRRT
INTRAVENOUS_CENTRAL | Status: DC | PRN
  Filled 2019-05-07: qty 1000

## 2019-05-07 MED ORDER — LACTULOSE 10 GM/15ML PO SOLN
10.0000 g | Freq: Two times a day (BID) | ORAL | Status: DC
  Administered 2019-05-07 – 2019-05-08 (×3): 10 g
  Filled 2019-05-07 (×3): qty 15

## 2019-05-07 MED ORDER — PHENYLEPHRINE CONCENTRATED 100MG/250ML (0.4 MG/ML) INFUSION SIMPLE
0.0000 ug/min | INTRAVENOUS | Status: DC
  Filled 2019-05-07: qty 250

## 2019-05-07 MED ORDER — SODIUM BICARBONATE 8.4 % IV SOLN
100.0000 meq | Freq: Once | INTRAVENOUS | Status: AC
  Administered 2019-05-07: 50 meq via INTRAVENOUS
  Filled 2019-05-07: qty 100

## 2019-05-07 MED ORDER — SODIUM CHLORIDE 0.9% IV SOLUTION: Freq: Once | INTRAVENOUS | Status: AC

## 2019-05-07 MED ORDER — GABAPENTIN 250 MG/5ML PO SOLN
100.0000 mg | Freq: Three times a day (TID) | ORAL | Status: DC
  Administered 2019-05-07 – 2019-05-08 (×3): 100 mg
  Filled 2019-05-07 (×6): qty 2

## 2019-05-07 MED ORDER — LACTATED RINGERS IV BOLUS: 1000.0000 mL | Freq: Once | INTRAVENOUS | Status: AC

## 2019-05-07 MED ORDER — DOCUSATE SODIUM 50 MG/5ML PO LIQD: 100.0000 mg | Freq: Two times a day (BID) | ORAL | Status: DC | PRN

## 2019-05-07 MED ORDER — CHLORHEXIDINE GLUCONATE CLOTH 2 % EX PADS
6.0000 | MEDICATED_PAD | Freq: Every day | CUTANEOUS | Status: DC
  Administered 2019-05-07: 6 via TOPICAL

## 2019-05-07 MED ORDER — SODIUM CHLORIDE 0.9 % IV SOLN: INTRAVENOUS | Status: DC | PRN

## 2019-05-07 MED ORDER — SODIUM BICARBONATE 8.4 % IV SOLN
INTRAVENOUS | Status: AC
  Filled 2019-05-07: qty 50

## 2019-05-07 MED ORDER — SODIUM CHLORIDE 0.9 % IV SOLN
2.0000 g | Freq: Two times a day (BID) | INTRAVENOUS | Status: DC
  Administered 2019-05-07 – 2019-05-08 (×2): 2 g via INTRAVENOUS
  Filled 2019-05-07 (×2): qty 2

## 2019-05-07 MED ORDER — HEPARIN SODIUM (PORCINE) 1000 UNIT/ML DIALYSIS
1000.0000 [IU] | INTRAMUSCULAR | Status: DC | PRN
  Administered 2019-05-07: 2000 [IU] via INTRAVENOUS_CENTRAL
  Filled 2019-05-07: qty 3
  Filled 2019-05-07 (×3): qty 6

## 2019-05-07 MED ORDER — MIDAZOLAM HCL 2 MG/2ML IJ SOLN: 2.0000 mg | INTRAMUSCULAR | Status: DC | PRN

## 2019-05-07 MED ORDER — NOREPINEPHRINE 4 MG/250ML-% IV SOLN
0.0000 ug/min | INTRAVENOUS | Status: DC
  Administered 2019-05-07: 1.3333 ug/min via INTRAVENOUS
  Administered 2019-05-07: 11:00:00 32 ug/min via INTRAVENOUS
  Filled 2019-05-07: qty 250

## 2019-05-07 MED ORDER — ETOMIDATE 2 MG/ML IV SOLN
20.0000 mg | Freq: Once | INTRAVENOUS | Status: AC
  Administered 2019-05-07: 20 mg via INTRAVENOUS

## 2019-05-07 MED ORDER — SODIUM BICARBONATE 8.4 % IV SOLN
200.0000 meq | Freq: Once | INTRAVENOUS | Status: AC
  Administered 2019-05-07: 200 meq via INTRAVENOUS
  Filled 2019-05-07: qty 50
  Filled 2019-05-07: qty 200

## 2019-05-07 MED ORDER — FAMOTIDINE IN NACL 20-0.9 MG/50ML-% IV SOLN
20.0000 mg | Freq: Once | INTRAVENOUS | Status: AC
  Administered 2019-05-07: 20 mg via INTRAVENOUS
  Filled 2019-05-07: qty 50

## 2019-05-07 MED ORDER — HEPARIN BOLUS VIA INFUSION (CRRT)
1000.0000 [IU] | INTRAVENOUS | Status: DC | PRN
  Administered 2019-05-07 (×2): 1000 [IU] via INTRAVENOUS_CENTRAL
  Filled 2019-05-07: qty 1000

## 2019-05-07 MED ORDER — PANTOPRAZOLE SODIUM 40 MG IV SOLR
40.0000 mg | INTRAVENOUS | Status: DC
  Administered 2019-05-07: 40 mg via INTRAVENOUS
  Filled 2019-05-07: qty 40

## 2019-05-07 MED ORDER — NOREPINEPHRINE 4 MG/250ML-% IV SOLN
INTRAVENOUS | Status: AC
  Filled 2019-05-07: qty 250

## 2019-05-07 MED ORDER — LACTATED RINGERS IV BOLUS
1000.0000 mL | Freq: Once | INTRAVENOUS | Status: AC
  Administered 2019-05-07: 1000 mL via INTRAVENOUS

## 2019-05-07 MED ORDER — FENTANYL CITRATE (PF) 100 MCG/2ML IJ SOLN
INTRAMUSCULAR | Status: AC
  Administered 2019-05-07: 100 ug
  Filled 2019-05-07: qty 2

## 2019-05-07 MED ORDER — FENTANYL CITRATE (PF) 100 MCG/2ML IJ SOLN: 100.0000 ug | Freq: Once | INTRAMUSCULAR | Status: DC

## 2019-05-07 MED ORDER — FENTANYL BOLUS VIA INFUSION
50.0000 ug | INTRAVENOUS | Status: DC | PRN
  Administered 2019-05-07: 19:00:00 50 ug via INTRAVENOUS
  Filled 2019-05-07: qty 50

## 2019-05-07 MED ORDER — SODIUM CHLORIDE 0.9 % IV SOLN
250.0000 [IU]/h | INTRAVENOUS | Status: DC
  Administered 2019-05-07: 250 [IU]/h via INTRAVENOUS_CENTRAL
  Administered 2019-05-08: 1350 [IU]/h via INTRAVENOUS_CENTRAL
  Administered 2019-05-08: 1100 [IU]/h via INTRAVENOUS_CENTRAL
  Filled 2019-05-07 (×4): qty 2

## 2019-05-07 MED ORDER — DIPHENHYDRAMINE HCL 50 MG/ML IJ SOLN
12.5000 mg | Freq: Once | INTRAMUSCULAR | Status: AC
  Administered 2019-05-07: 12.5 mg via INTRAVENOUS
  Filled 2019-05-07: qty 1

## 2019-05-07 MED ORDER — SODIUM CHLORIDE 0.9 % IV SOLN: 250.0000 mL | INTRAVENOUS | Status: DC

## 2019-05-07 MED ORDER — NOREPINEPHRINE 16 MG/250ML-% IV SOLN
0.0000 ug/min | INTRAVENOUS | Status: DC
  Administered 2019-05-07: 40 ug/min via INTRAVENOUS
  Administered 2019-05-07 – 2019-05-08 (×3): 50 ug/min via INTRAVENOUS
  Filled 2019-05-07 (×4): qty 250

## 2019-05-07 MED ORDER — FENTANYL CITRATE (PF) 100 MCG/2ML IJ SOLN: 50.0000 ug | Freq: Once | INTRAMUSCULAR | Status: DC

## 2019-05-07 MED ORDER — STERILE WATER FOR INJECTION IV SOLN
INTRAVENOUS | Status: DC
  Filled 2019-05-07 (×7): qty 150

## 2019-05-07 MED ORDER — PRISMASOL BGK 4/2.5 32-4-2.5 MEQ/L IV SOLN
INTRAVENOUS | Status: DC
  Filled 2019-05-07 (×16): qty 5000

## 2019-05-07 MED ORDER — VASOPRESSIN 20 UNIT/ML IV SOLN
0.0300 [IU]/min | INTRAVENOUS | Status: DC
  Administered 2019-05-07 – 2019-05-08 (×2): 0.03 [IU]/min via INTRAVENOUS
  Filled 2019-05-07 (×2): qty 2

## 2019-05-07 MED ORDER — ALBUMIN HUMAN 25 % IV SOLN
25.0000 g | Freq: Once | INTRAVENOUS | Status: AC
  Administered 2019-05-07: 25 g via INTRAVENOUS
  Filled 2019-05-07: qty 100

## 2019-05-07 MED ORDER — VITAMIN K1 10 MG/ML IJ SOLN
5.0000 mg | Freq: Every day | INTRAVENOUS | Status: DC
  Administered 2019-05-07 – 2019-05-08 (×2): 5 mg via INTRAVENOUS
  Filled 2019-05-07 (×2): qty 0.5

## 2019-05-07 MED ORDER — VANCOMYCIN HCL 1500 MG/300ML IV SOLN
1500.0000 mg | Freq: Once | INTRAVENOUS | Status: AC
  Administered 2019-05-07: 1500 mg via INTRAVENOUS
  Filled 2019-05-07: qty 300

## 2019-05-07 MED ORDER — ROCURONIUM BROMIDE 50 MG/5ML IV SOLN
100.0000 mg | Freq: Once | INTRAVENOUS | Status: AC
  Administered 2019-05-07: 100 mg via INTRAVENOUS
  Filled 2019-05-07: qty 10

## 2019-05-07 MED ORDER — ORAL CARE MOUTH RINSE
15.0000 mL | OROMUCOSAL | Status: DC
  Administered 2019-05-07 – 2019-05-08 (×10): 15 mL via OROMUCOSAL

## 2019-05-07 MED ORDER — VANCOMYCIN HCL 750 MG/150ML IV SOLN
750.0000 mg | INTRAVENOUS | Status: DC
  Filled 2019-05-07: qty 150

## 2019-05-07 MED ORDER — CHLORHEXIDINE GLUCONATE 0.12% ORAL RINSE (MEDLINE KIT)
15.0000 mL | Freq: Two times a day (BID) | OROMUCOSAL | Status: DC
  Administered 2019-05-07 – 2019-05-08 (×3): 15 mL via OROMUCOSAL

## 2019-05-07 MED ORDER — BISACODYL 10 MG RE SUPP: 10.0000 mg | Freq: Every day | RECTAL | Status: DC | PRN

## 2019-05-07 MED ORDER — FENTANYL 2500MCG IN NS 250ML (10MCG/ML) PREMIX INFUSION
50.0000 ug/h | INTRAVENOUS | Status: DC
  Administered 2019-05-07: 50 ug/h via INTRAVENOUS
  Administered 2019-05-08: 75 ug/h via INTRAVENOUS
  Filled 2019-05-07 (×2): qty 250

## 2019-05-07 MED ORDER — PHENYLEPHRINE HCL-NACL 10-0.9 MG/250ML-% IV SOLN: 25.0000 ug/min | INTRAVENOUS | Status: DC

## 2019-05-07 NOTE — Progress Notes (Signed)
[facility] Progress Note Patient Name: William Nguyen DOB: [birth year] MRN: [id number]   Date of Service  05/07/2019  HPI/Events of Note  Hypotension - BP = 99/27 with MAP = 45.   eICU Interventions  Will order: 1. NaHCO3 200 meq IV now.  2. Phenylephrine IV infusion via peripheral IV. Titrate to MAP >=65.      Intervention Category Major Interventions: Hypotension - evaluation and management  Mele Sylvester Eugene 05/07/2019, 3:32 AM

## 2019-05-07 NOTE — Procedures (Signed)
Arterial Catheter Insertion Procedure Note RAEKWON YOUSE [id number] [birth year]  Procedure: Insertion of Arterial Catheter  Indications: Blood pressure monitoring  Procedure Details Consent: Unable to obtain consent because of altered level of consciousness. Time Out: Verified patient identification, verified procedure, site/side was marked, verified correct patient position, special equipment/implants available, medications/allergies/relevent history reviewed, required imaging and test results available.  Performed  Maximum sterile technique was used including cap, gloves, gown, hand hygiene, mask and sheet. Skin prep: Chlorhexidine; local anesthetic administered 20 gauge catheter was inserted into right radial artery using the Seldinger technique. ULTRASOUND GUIDANCE USED: NO Evaluation Blood flow good; BP tracing good. Complications: No apparent complications.   Ciro Backer 05/07/2019

## 2019-05-07 NOTE — Significant Event (Addendum)
PCCM Overnight   As anticipated pt continued to decompensate due to high anion gap metabolic acidosis drom severely elevated lactate in the setting of liver dysfunction. Increased work of breathing and discomfort on exam with flushed appearance Verbal but altered from previous assessment  Ph 7.003 pCO2 23.3 PO2 91 HCO3- 5.8 on last ABG Pt has been on Bicarb gtt 150 mEq in 1L at rate of 125 ml/hr with additional bicarb pushes given by Elink physician--> still no real improvement in ph  Daytime Renal MD will assess patient for possible RRT   Plan:  Endotracheally intubate w/ vasopressor support Anticipate oral bleeding given elevated INR Once intubated and on sedation  PA to place a Right IJ CVC catheter for possible RRT Consults placed to both Renal and Gastroenterology Discussed with Renal-> pt to be seen Daytime coverage please follow up with GI   .Marland KitchenSigned Dr Seward Carol [facility] Critical Care Locums

## 2019-05-07 NOTE — Progress Notes (Signed)
ETT exchanged by CCM MD due to a blown cuff using a bougie.  Good color change with ETCO2 detector and bilateral BS.

## 2019-05-07 NOTE — Progress Notes (Signed)
CRITICAL VALUE ALERT  Critical Value:  pCO2 17.5  Date & Time Notied:  05/07/19 @ 0153  Provider Notified: Warren Lacy  Orders Received/Actions taken: TBD

## 2019-05-07 NOTE — Progress Notes (Signed)
CRITICAL VALUE ALERT  Critical Value:  [facility] 6.993  Date & Time Notied:  05/07/19 @ 0153  Provider Notified: Warren Lacy  Orders Received/Actions taken: TBD

## 2019-05-07 NOTE — Procedures (Signed)
Hemodialysis Catheter Insertion Procedure Note William Nguyen [id number] June 04, 1970  Procedure: Insertion of Hemodialysis Catheter Indications: CRRT  Procedure Details Consent: Unable to obtain consent because of altered level of consciousness. Time Out: Verified patient identification, verified procedure, site/side was marked, verified correct patient position, special equipment/implants available, medications/allergies/relevent history reviewed, required imaging and test results available.  Performed  Maximum sterile technique was used including antiseptics, cap, gloves, gown, hand hygiene, mask and sheet. Skin prep: Chlorhexidine; local anesthetic administered A antimicrobial bonded/coated triple lumen catheter was placed in the right internal jugular vein using the Seldinger technique.  Evaluation Blood flow good Complications: No apparent complications Patient did tolerate procedure well. Chest X-ray ordered to verify placement.  CXR: pending.  Procedure performed under direct ultrasound guidance for real time vessel cannulation.      [name], [facility] Pgr: [phone]  or [phone] 05/07/2019, 6:39 AM

## 2019-05-07 NOTE — Progress Notes (Addendum)
PCCM Overnight   On re-evaluation post pull back of ETT by 2 cm Sonorous breathing No desaturation Difference of 100 cc between VTi and VTe Balloon not holding full air Was checked prior to intubation and was intact Will need to exchange ETT due to cuff leak Discussed with RT who was at bedside   .Marland KitchenSigned Dr Seward Carol Pulmonary Critical Care Locums

## 2019-05-07 NOTE — Progress Notes (Signed)
eLink Physician-Brief Progress Note Patient Name: William Nguyen DOB: 1970-03-26 MRN: [id number]   Date of Service  05/07/2019  HPI/Events of Note  49 y.o. male who has a PMH including but not limited to hepatic adenoma s/p resection [id number], alcoholic steatohepatitis, HTN, tobacco dependence (see "past medical history" for rest).  He presented to [facility] ED 3/31 with generalized progressive leg swelling x 6 months and mild dyspnea x a few days.  On day of presentation, he also developed central chest and abdominal pain.  No known fevers/chills/sweats, headaches, cough, N/V/D, myalgias, exposures to known sick contacts. Last pH noted to be 7.028. Currently on a NaHCO3 IV infusion. BP = 86/33 with MAP = 50.   eICU Interventions  Will order: 1. ABG STAT. 2. Ground team addressing issues. Giving fluid and NaHCO3.     Intervention Category Evaluation Type: New Patient Evaluation  Lysle Dingwall 05/07/2019, 12:30 AM

## 2019-05-07 NOTE — Progress Notes (Addendum)
NAME:  William Nguyen, MRN:  [id number], DOB:  January 02, 1971, LOS: 1 ADMISSION DATE:  04/23/2019, CONSULTATION DATE:  04/09/2019 REFERRING MD:  Ralene Bathe  CHIEF COMPLAINT:  SOB   Brief History   William Nguyen is a 49 y.o. male who presented 3/31 with AKI and profound lactic acidosis due to impaired hepatic clearance and with multiple metabolic derangements.  History of present illness   William Nguyen is a 49 y.o. male who has a PMH including but not limited to hepatic adenoma s/p resection [id number], alcoholic steatohepatitis, HTN, tobacco dependence (see "past medical history" for rest).  He presented to [facility] ED 3/31 with generalized progressive leg swelling x 6 months and mild dyspnea x a few days.  On day of presentation, he also developed central chest and abdominal pain.  No known fevers/chills/sweats, headaches, cough, N/V/D, myalgias, exposures to known sick contacts.  He has a hx of partial right hepatectomy on 06/27/18 for hepatic adenoma (followed at [facility] with plans for MRI surveillance imaging moving forward) and states that he is compliant with all of his medications.  He has a hx of EtOH use but states last drink was in October 2020.  He does smoke cigarettes, 5-6 per day.  In ED, he was found to have multiple metabolic derangements including CO2 < 7, CBG 59, SCR 2.15, transaminitis, BNP 1146, lactate > 11.    Past Medical History  has Pneumonia; Liver mass, right lobe; Hepatic adenoma; Dyspnea on exertion; Tobacco smoker, less than 10 cigarettes per day; Coronary artery calcification seen on CAT scan; Abnormal finding on cardiovascular stress test; Reducible left inguinal hernia; Leg swelling; Hypertension; Diastolic dysfunction without heart failure; Elevated lactic acid level; Metabolic acidosis; AKI (acute kidney injury) ([place]); Acute hypoxemic respiratory failure ([place]); Transaminitis; Lactic acidosis; Alcoholic steatohepatitis; Acute on chronic heart failure with preserved ejection fraction ([place]); H/O ETOH  abuse; Tobacco dependence; Encounter for central line placement; and Endotracheally intubated on their problem list.  [place] [facility] Events   3/31 > admit.  Consults:  None.  Procedures:  None.  Significant Diagnostic Tests:  CT A / P 3/31 > small right pleural effusion, small volume ascites, 24mm nonobstructing right renal calculus.  No hydronephrosis.  Micro Data:  Flu 3/31 >  COVID 3/31 >  Blood 3/31 >   Antimicrobials:  Zosyn x 1 in ED.  Interim history/subjective:  Now intubated and sedated.   Objective:  Blood pressure (!) 103/54, pulse 74, temperature (!) 97 F (36.1 C), temperature source Axillary, resp. rate (!) 25, height 5\' 10"  (1.778 m), weight 83.4 kg, SpO2 100 %.    Vent Mode: PRVC FiO2 (%):  [100 %] 100 % Set Rate:  [24 bmp] 24 bmp Vt Set:  [580 mL] 580 mL PEEP:  [5 cmH20] 5 cmH20   Intake/Output Summary (Last 24 hours) at 05/07/2019 0835 Last data filed at 05/07/2019 0700 Gross per 24 hour  Intake 3250.37 ml  Output --  Net 3250.37 ml   Filed Weights   04/22/2019 1638 05/07/19 0000  Weight: 80 kg 83.4 kg    Examination: General: middle aged male of normal body habitus in NAD Neuro: sedated on vent HEENT: [place]/AT. Sclerae anicteric. Unable to appreciate JVD Cardiovascular: RRR, no M/R/G.  Lungs: Clear bilateral breath sounds.  Abdomen: BS x 4, soft, NT/ND.  Musculoskeletal: No gross deformities, 1+ pitting edema up to knees bilaterally. Skin: Intact, warm, no rashes.  Assessment & Plan:   Profound lactic acidosis - unclear etiology for acute  rise in lactate but level certainly impacted by acute liver injury resulting in decreased clearance. Did not have significant improvement with HCO3 administration.  - Nephrology now following and has initiated CRRT. - Hemodynamic support with norepinephrine   Acute hypercarbic respiratory failure: unable to keep up with compensatory demands of metabolic acidosis.  - Full vent support - ABG reviewed and  settings adjusted - Fentanyl infusion with RASS goal -1 to -2.  - VAP prevention bundle - CXR PRN  Acute liver injury - with history of hepatic adenoma (resection May 2020 and followed at [facility]) and alcoholic steatohepatitis. APAP and salicylate levels WNL. Complicated by coagulopathy and hyperammonemia.  - Trend LFT's. - FFP transfusing now. Trend Coags - Lactulose  - Consider SBP prophylaxis if worsens. Small volume ascites on CT abdomen.  - Consult gastroenterology - Ultimately needs transfer to tertiary center with hepatology service. [facility] hepatology recommended admission to [facility]. Will await GI consultation for further recs.  - EtOH cessation counseling.  Shock: hypovolemic vs cardiogenic shock. He is probably intravascularly dry. Likely an effect of sedative medications. Echo from 02/2019 shows LVEF 60-65% - ICU hemodynamic monitoring - Norepinephrine titrated to MAP > 65 mmHg - Shock dose vasopressin - Careful with IVF  - Consider volume expansion with albumin. Has responded to this somewhat.   AKI: baseline serum creatinine seems to be about 0.86. 2.5 on admission. Hepatorenal syndrome is a possibility.  - Nephrology following - CRRT will be initiated shortly - Follow renal function panel  - Avoid nephrotoxins.   Hypoglycemia - ? Due to underlying hepatic disease / acute injury. - Q4hr CBG checks. - D50 PRN for now for CBG < 70, can consider D5 or D10 if persists.   Best Practice:  Diet: NPO. Consider TF 4/2 Pain/Anxiety/Delirium protocol (if indicated): Fenatnyl infusion per PAD protocol VAP protocol (if indicated): Yes DVT prophylaxis: SCDs, coagulopathic GI prophylaxis: PPI Glucose control: PPI Mobility: Bedrest:  Code Status: Full. Family Communication: Mother updated 4/1 AM Disposition: ICU.  Labs   CBC: Recent Labs  Lab 05/05/2019 1645 05/07/19 0146 05/07/19 0335 05/07/19 0444  WBC 10.4  --  10.9*  --   HGB 12.1* 11.9* 11.1* 12.2*  HCT 38.4* 35.0*  35.4* 36.0*  MCV 105.2*  --  106.9*  --   PLT 218  --  172  --    Basic Metabolic Panel: Recent Labs  Lab 05/04/2019 1645 04/24/2019 2300 05/07/19 0146 05/07/19 0335 05/07/19 0444 05/07/19 0701  NA 131* 133* 131*  --  135 142  K 4.9 5.4* 4.9  --  4.4 4.2  CL 92* 94*  --   --   --  89*  CO2 <7* <7*  --   --   --  8*  GLUCOSE 59* 87  --   --   --  95  BUN 36* 36*  --   --   --  39*  CREATININE 2.15* 2.27*  --   --   --  2.56*  CALCIUM 9.8 8.9  --   --   --  8.3*  MG  --   --   --  2.3  --   --   PHOS  --   --   --  9.2*  --  9.1*   GFR: Estimated Creatinine Clearance: 37.2 mL/min (A) (by C-G formula based on SCr of 2.56 mg/dL (H)). Recent Labs  Lab 04/10/2019 1645 04/13/2019 1752 04/09/2019 2315 05/07/19 0335 05/07/19 0701  WBC 10.4  --   --  10.9*  --   LATICACIDVEN  --  >11.0* >11.0*  --  >11.0*   Liver Function Tests: Recent Labs  Lab 04/10/2019 1645 05/07/19 0335 05/07/19 0701  AST 679* 770*  --   ALT 353* 164*  --   ALKPHOS 139* 109  --   BILITOT 3.8* 3.1*  --   PROT 7.7 6.5  --   ALBUMIN 4.3 3.7 3.6   Recent Labs  Lab 05/05/2019 1645  LIPASE 50   Recent Labs  Lab 04/15/2019 1645 05/07/19 0701  AMMONIA 60* 81*   ABG    Component Value Date/Time   PHART 7.003 (LL) 05/07/2019 0444   PCO2ART 23.3 (L) 05/07/2019 0444   PO2ART 91.0 05/07/2019 0444   HCO3 5.8 (L) 05/07/2019 0444   TCO2 7 (L) 05/07/2019 0444   ACIDBASEDEF 24.0 (H) 05/07/2019 0444   O2SAT 92.0 05/07/2019 0444    Coagulation Profile: Recent Labs  Lab 04/23/2019 2300  INR 2.4*   Cardiac Enzymes: No results for input(s): CKTOTAL, CKMB, CKMBINDEX, TROPONINI in the last 168 hours. HbA1C: Hgb A1c MFr Bld  Date/Time Value Ref Range Status  02/18/2019 10:31 AM 4.9 4.8 - 5.6 % Final    Comment:             Prediabetes: 5.7 - 6.4          Diabetes: >6.4          Glycemic control for adults with diabetes: <7.0    CBG: Recent Labs  Lab 04/20/2019 2138 04/06/2019 2231 04/07/2019 2342 05/07/19 0309  05/07/19 0755  GLUCAP 72 75 73 77 95      Critical care time: 50 min    Georgann Housekeeper, AGACNP-BC [place] for personal pager PCCM on call pager [phone]  05/07/2019 9:48 AM

## 2019-05-07 NOTE — Progress Notes (Signed)
[facility] Progress Note Patient Name: William Nguyen DOB: 01-12-1971 MRN: [id number]   Date of Service  05/07/2019  HPI/Events of Note  Patient has developed swelling in the neck, jaw and chest area associated with a rash. Received Zosyn about 8:43 PM last night. His pH is still 7.003.   eICU Interventions  Will order: 1. Pepcid 20 mg IV now.  2. Benadryl 12.5 mg IV now.  3. NaHCO3 100 meq IV X 1 now.  4. Repeat ABG at 8 AM.     Intervention Category Major Interventions: Other:  Lysle Dingwall 05/07/2019, 5:16 AM

## 2019-05-07 NOTE — Consult Note (Signed)
Referring Provider: No ref. provider found Primary Care Physician:  Gildardo Pounds, NP Primary Nephrologist:    Reason for Consultation:   Anuric acute kidney injury, metabolic acidosis, hypotension, maintenance of euvolemia, treatment of electrolyte abnormalities, assessment and treatment of acid-base abnormalities  HPI: This is a 49 year old gentleman with a history of alcohol abuse, cocaine abuse, hypertension, hepatic adenoma 7.9 cm resected 06/27/2018.., gastric ulcer.  Has not drank in 6 months.  History of right partial hepatectomy 06/27/2018.  He is followed at [facility].  Baseline serum creatinine 0.7 mg/dL  He presented to the emergency room for 04/25/2019 with increased lower extremity and increased shortness of breath.  He was found to have a profound acidosis on labs.  He was found to have a CO2<7 serum creatinine 2.15 and lactate level of greater than 11.    CT scan abdomen 04/12/2019 noncontrast revealed a right pleural effusion small volume ascites nonobstructing renal calculus  Chest x-ray cardiomegaly new onset bilateral pulmonary infiltrates small left pleural effusion  2D echo 03/03/2019 EF 60-65% mild mitral regurgitation  No urine output with large volume resuscitation of about 3 L  Blood pressure 102/50 pulse 78 temperature 97 O2 sats 100% FiO2 100% status post intubation  Placement of dialysis catheter appreciate assistance from critical care medicine  ABG pH 7.0 PCO2 23 PO2 91  Sodium 135 potassium 4.4 phosphorus 9.2 magnesium 2.3 alkaline phosphatase 109 albumin 3.7 AST 770 ALT 164 bilirubin 2 WBC 10.9 hemoglobin 11.1 platelets 172.  This chloride 94 CO2 less than 7 glucose 87 BUN 36 creatinine 2.27 calcium 8.9.      Past Medical History:  Diagnosis Date  . Alcohol abuse 10/18/2017   drinks a 6 pack daily  . Cocaine abuse ([place])   . Fracture of angle of right mandible, initial encounter for closed fracture ([place])   . GERD (gastroesophageal reflux disease)    h/o   . Hypertension   . 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 Pneumonia 03/2016  . Reducible left inguinal hernia   . Smoking   . Ulcer    gastric ulcers with bleeding episode    Past Surgical History:  Procedure Laterality Date  . CardioPulmonary Exercise Test: CPX  04/2016   Exercise testing with gas exchange shows a moderate functional limitation due to a circulatory limitation -- Peak VO2 53%.  There were no ST-T wave changes with exercise. Resting spirometry suggests mild restriction. Hypertensive response to exercise.  . hand surgery Right   . INGUINAL HERNIA REPAIR Left 02/17/2018   Procedure: OPEN HERNIA REPAIR INGUINAL ADULT;  Surgeon: Olean Ree, MD;  Location: [facility] ORS;  Service: General;  Laterality: Left;    Prior to Admission medications   Medication Sig Start Date End Date Taking? Authorizing Provider  albuterol (VENTOLIN HFA) 108 (90 Base) MCG/ACT inhaler INHALE 2 PUFFS INTO THE LUNGS EVERY 6 (SIX) HOURS AS NEEDED FOR WHEEZING OR SHORTNESS OF BREATH. 04/14/19   Gildardo Pounds, NP  allopurinol (ZYLOPRIM) 100 MG tablet Take 2 tablets (200 mg total) by mouth daily. Patient taking differently: Take 100 mg by mouth daily.  07/24/18 04/03/19  Gildardo Pounds, NP  amLODipine (NORVASC) 5 MG tablet Take 1 tablet (5 mg total) by mouth daily. 02/17/19 05/18/19  Gildardo Pounds, NP  Coenzyme Q10 (CO Q-10) 100 MG CAPS Take 100 mg by mouth daily.    [provider]  furosemide (LASIX) 20 MG tablet Take 1 tablet (20 mg  total) by mouth daily. 04/22/19 05/22/19  Gildardo Pounds, NP  ibuprofen (ADVIL,MOTRIN) 600 MG tablet Take 1 tablet (600 mg total) by mouth every 8 (eight) hours as needed for fever, mild pain or moderate pain. 02/17/18   Olean Ree, MD  omeprazole (PRILOSEC) 20 MG capsule Take 1 capsule (20 mg total) by mouth 2 (two) times daily before a meal. 04/22/19 07/21/19  Gildardo Pounds, NP    Current Facility-Administered Medications  Medication  Dose Route Frequency Provider Last Rate Last Admin  . 0.9 %  sodium chloride infusion (Manually program via Guardrails IV Fluids)   Intravenous Once Scatliffe, Kristen D, MD      . 0.9 %  sodium chloride infusion  250 mL Intravenous Continuous Anders Simmonds, MD      . 0.9 %  sodium chloride infusion   Intra-arterial PRN Scatliffe, Cyril Mourning D, MD      . albumin human 5 % solution 12.5 g  12.5 g Intravenous Once Desai, Rahul P, PA-C      . bisacodyl (DULCOLAX) suppository 10 mg  10 mg Rectal Daily PRN Scatliffe, Rise Paganini, MD      . Chlorhexidine Gluconate Cloth 2 % PADS 6 each  6 each Topical Daily Scatliffe, Kristen D, MD      . dextrose 50 % solution 50 mL  50 mL Intravenous PRN Desai, Rahul P, PA-C      . docusate (COLACE) 50 MG/5ML liquid 100 mg  100 mg Per Tube BID PRN Scatliffe, Kristen D, MD      . fentaNYL (SUBLIMAZE) bolus via infusion 50 mcg  50 mcg Intravenous Q15 min PRN Scatliffe, Kristen D, MD      . fentaNYL (SUBLIMAZE) injection 100 mcg  100 mcg Intravenous Once Scatliffe, Kristen D, MD      . fentaNYL 256mcg in NS 239mL (17mcg/ml) infusion-PREMIX  50-200 mcg/hr Intravenous Continuous Scatliffe, Rise Paganini, MD 5 mL/hr at 05/07/19 0656 50 mcg/hr at 05/07/19 0656  . gabapentin (NEURONTIN) 250 MG/5ML solution 100 mg  100 mg Per Tube Q8H Scatliffe, Kristen D, MD      . heparin injection 1,000-6,000 Units  1,000-6,000 Units CRRT PRN Edrick Oh, MD      . heparin injection 5,000 Units  5,000 Units Subcutaneous Q8H Desai, Rahul P, PA-C   5,000 Units at 05/07/19 [id number]  . midazolam (VERSED) injection 2 mg  2 mg Intravenous Q15 min PRN Scatliffe, Kristen D, MD      . midazolam (VERSED) injection 2 mg  2 mg Intravenous Q2H PRN Scatliffe, Kristen D, MD      . norepinephrine (LEVOPHED) 4mg  in 260mL premix infusion  0-40 mcg/min Intravenous Titrated Scatliffe, Rise Paganini, MD 5 mL/hr at 05/07/19 0614 1.3333 mcg/min at 05/07/19 [id number]  . prismasol BGK 4/2.5 infusion   CRRT Continuous Edrick Oh,  MD      . sodium bicarbonate 1 mEq/mL injection           . sodium bicarbonate 150 mEq in dextrose 5 % 1,000 mL infusion   Intravenous Continuous Anders Simmonds, MD 150 mL/hr at 05/07/19 0549 New Bag at 05/07/19 0549  . sodium bicarbonate 150 mEq in sterile water 1,000 mL infusion   CRRT Continuous Edrick Oh, MD      . sodium bicarbonate 150 mEq in sterile water 1,000 mL infusion   CRRT Continuous Edrick Oh, MD      . sodium chloride 0.9 % primer fluid for CRRT   CRRT PRN Edrick Oh,  MD      . vasopressin (PITRESSIN) 40 Units in sodium chloride 0.9 % 250 mL (0.16 Units/mL) infusion  0.03 Units/min Intravenous Continuous Scatliffe, Rise Paganini, MD        Allergies as of 04/20/2019 - Review Complete 04/26/2019  Allergen Reaction Noted  . Vicodin [hydrocodone-acetaminophen] Nausea And Vomiting 01/22/2018    Family History  Problem Relation Age of Onset  . Healthy Mother   . Healthy Father   . Healthy Sister   . Healthy Brother   . Heart disease Paternal Grandmother        Unsure of details - still living at age    Social History   Socioeconomic History  . Marital status: Single    Spouse name: Not on file  . Number of children: 2  . Years of education: Not on file  . Highest education level: Not on file  Occupational History  . Not on file  Tobacco Use  . Smoking status: Current Every Day Smoker    Packs/day: 0.50    Years: 25.00    Pack years: 12.50    Types: Cigarettes  . Smokeless tobacco: Never Used  Substance and Sexual Activity  . Alcohol use: Not Currently    Alcohol/week: 2.0 standard drinks    Types: 2 Cans of beer per week    Comment: heavier in past, for the past 20 years, but now 12 on weekends  . Drug use: Not Currently    Comment: + UDS FOR COCAINE IN 2103   . Sexual activity: Not Currently  Other Topics Concern  . Not on file  Social History Narrative  . Not on file   Social Determinants of Health   Financial Resource Strain:   . Difficulty  of Paying Living Expenses:   Food Insecurity:   . Worried About Charity fundraiser in the Last Year:   . Arboriculturist in the Last Year:   Transportation Needs:   . Film/video editor (Medical):   Marland Kitchen Lack of Transportation (Non-Medical):   Physical Activity:   . Days of Exercise per Week:   . Minutes of Exercise per Session:   Stress:   . Feeling of Stress :   Social Connections:   . Frequency of Communication with Friends and Family:   . Frequency of Social Gatherings with Friends and Family:   . Attends Religious Services:   . Active Member of Clubs or Organizations:   . Attends Archivist Meetings:   Marland Kitchen Marital Status:   Intimate Partner Violence:   . Fear of Current or Ex-Partner:   . Emotionally Abused:   Marland Kitchen Physically Abused:   . Sexually Abused:     Review of Systems: Patient intubated sedated unable to obtain full review of systems  Physical Exam: Vital signs in last 24 hours: Temp:  [96.5 F (35.8 C)-97 F (36.1 C)] 97 F (36.1 C) (04/01 0401) Pulse Rate:  [61-88] 67 (04/01 0500) Resp:  [17-27] 23 (04/01 0500) BP: (80-116)/(18-77) 95/70 (04/01 0500) SpO2:  [90 %-100 %] 100 % (04/01 0633) FiO2 (%):  [100 %] 100 % (04/01 [id number]) Weight:  [80 kg-83.4 kg] 83.4 kg (04/01 0000)   General:   Alert,  Well-developed, well-nourished, pleasant and cooperative in NAD Head:  Normocephalic and atraumatic. Eyes:  Sclera clear, no icterus.   Conjunctiva pink. Ears:  Normal auditory acuity. Nose:  No deformity, discharge,  or lesions. Mouth:  No deformity or lesions, dentition normal. Neck:  Supple; no masses or thyromegaly. JVP not elevated Lungs:  Clear throughout to auscultation.   No wheezes, crackles, or rhonchi. No acute distress. Heart:  Regular rate and rhythm; no murmurs, clicks, rubs,  or gallops. Abdomen:  Soft, nontender and nondistended. No masses, hepatosplenomegaly or hernias noted. Normal bowel sounds, without guarding, and without rebound.    Msk:  Symmetrical without gross deformities. Normal posture. Pulses:  No carotid, renal, femoral bruits. DP and PT symmetrical and equal Extremities:  Without clubbing or edema. Neurologic:  Alert and  oriented x4;  grossly normal neurologically. Skin:  Intact without significant lesions or rashes.   Intake/Output from previous day: 03/31 0701 - 04/01 0700 In: 2965.7 [I.V.:1962; IV Piggyback:1003.7] Out: -  Intake/Output this shift: No intake/output data recorded.  Lab Results: Recent Labs    05/02/2019 1645 04/26/2019 1645 05/07/19 0146 05/07/19 0335 05/07/19 0444  WBC 10.4  --   --  10.9*  --   HGB 12.1*   < > 11.9* 11.1* 12.2*  HCT 38.4*   < > 35.0* 35.4* 36.0*  PLT 218  --   --  172  --    < > = values in this interval not displayed.   BMET Recent Labs    04/25/2019 1645 04/14/2019 1645 04/24/2019 2300 05/07/19 0146 05/07/19 0335 05/07/19 0444  NA 131*   < > 133* 131*  --  135  K 4.9   < > 5.4* 4.9  --  4.4  CL 92*  --  94*  --   --   --   CO2 <7*  --  <7*  --   --   --   GLUCOSE 59*  --  87  --   --   --   BUN 36*  --  36*  --   --   --   CREATININE 2.15*  --  2.27*  --   --   --   CALCIUM 9.8  --  8.9  --   --   --   PHOS  --   --   --   --  9.2*  --    < > = values in this interval not displayed.   LFT Recent Labs    05/07/19 0335  PROT 6.5  ALBUMIN 3.7  AST 770*  ALT 164*  ALKPHOS 109  BILITOT 3.1*  BILIDIR 1.1*  IBILI 2.0*   PT/INR Recent Labs    04/27/2019 2300  LABPROT 25.6*  INR 2.4*   Hepatitis Panel No results for input(s): HEPBSAG, HCVAB, HEPAIGM, HEPBIGM in the last 72 hours.  Studies/Results: CT Abdomen Pelvis Wo Contrast  Result Date: 04/16/2019 CLINICAL DATA:  Elevated lateral sees, abdominal pain, acute renal failure, generalized leg swelling x6 months EXAM: CT ABDOMEN AND PELVIS WITHOUT CONTRAST TECHNIQUE: Multidetector CT imaging of the abdomen and pelvis was performed following the standard protocol without IV contrast.  COMPARISON:  04/01/2018 FINDINGS: Lower chest: Small right and trace left pleural effusions. Hepatobiliary: Postsurgical changes related to central right liver resection (series 3/image 32). Prior cholecystectomy. No intrahepatic or extrahepatic ductal dilatation. Pancreas: Within normal limits. Spleen: Within normal limits. Adrenals/Urinary Tract: Adrenal glands are within normal limits. 4 mm nonobstructing right renal calculus (series 3/image 37). Left kidney is within normal limits. No hydronephrosis. Thick-walled bladder, although underdistended. Stomach/Bowel: Stomach is within normal limits. No evidence of bowel obstruction. Appendix is not discretely visualized. Vascular/Lymphatic: No evidence of abdominal aortic aneurysm. Atherosclerotic calcifications of the abdominal aorta and branch vessels.  No suspicious abdominopelvic lymphadenopathy. Reproductive: Prostate is unremarkable. Other: Small volume pelvic ascites. Mild body wall edema. Musculoskeletal: Visualized osseous structures are within normal limits. IMPRESSION: Postsurgical changes related to central right liver resection and cholecystectomy. Small right pleural effusion. Small volume pelvic ascites. Mild body wall edema. 4 mm nonobstructing right renal calculus.  No hydronephrosis. Additional ancillary findings as above. Electronically Signed   By: Julian Hy M.D.   On: 04/08/2019 19:42   DG Chest Port 1 View  Result Date: 05/07/2019 CLINICAL DATA:  Respiratory failure. EXAM: PORTABLE CHEST 1 VIEW COMPARISON:  04/17/2019. FINDINGS: Cardiomegaly. New onset bilateral pulmonary infiltrates/edema. Small left pleural effusion. No pneumothorax. IMPRESSION: Cardiomegaly. New onset bilateral pulmonary infiltrates/edema. Small left pleural effusion. Findings suggest CHF. Electronically Signed   By: Marcello Moores  Register   On: 05/07/2019 05:21   DG Chest Port 1 View  Result Date: 05/05/2019 CLINICAL DATA:  Shortness of breath and chest pain EXAM:  PORTABLE CHEST 1 VIEW COMPARISON:  04/03/2019 FINDINGS: Cardiomegaly with mild central vascular congestion. No focal consolidation, pleural effusion, or pneumothorax. IMPRESSION: Cardiomegaly with mild central congestion Electronically Signed   By: Donavan Foil M.D.   On: 04/14/2019 18:11    Assessment/Plan:  Acute kidney injury with shock hypotension and profound metabolic acidosis.  Is anuric.  No evidence of hydronephrosis or obstruction on CT scan of abdomen and pelvis.  Unable to obtain urine sediment.  Appears that patient was taking nonsteroidal anti-inflammatory drugs prior to admission.  There also appears to be severe liver injury on the background of alcoholic steatohepatitis.  Patient has been abstinent for  greater than 6 months.  Hepatorenal syndrome would also fit with this scenario.  Patient does demonstrate ascites on CT scan.  Would avoid contrast and any nephrotoxins.  Would avoid vancomycin nonsteroidal anti-inflammatories Cox 2 inhibitors ACE inhibitors ARB's.  We will check a daily renal panel.  And try to send urine for electrolytes and urinalysis.  Anion gap metabolic acidosis with severe elevated lactate level in the setting of severe liver injury.  Will institute CRRT with bicarbonate replacement fluids.  Respiratory alkalosis.  Compensation for severe respiratory acidosis.  Patient now intubated.  History of EtOH abuse.  Last drink appears to be in October 2020.  Starting thiamine and folic supplementation.  History of hepatic adenoma and hepatic alcoholic steatohepatitis.  Awaiting GI consultation.   LOS: [place] @TODAY @7 :06 AM

## 2019-05-07 NOTE — Progress Notes (Signed)
Critical ABG values given to P. Hoffman, NP.  FIO2 decreased to 50%.

## 2019-05-07 NOTE — Progress Notes (Signed)
PCCM INTERVAL PROGRESS NOTE  I have again updated William Nguyen (the patient's mother), this time at bedside. She is aware of his refractory shock and refractory acidosis. I have told her this critical illness may not be survivable. She is of course saddened, but understands.    Georgann Housekeeper, AGACNP-BC [place]  See Amion for personal pager PCCM on call pager [phone]  05/07/2019 4:49 PM

## 2019-05-07 NOTE — Consult Note (Addendum)
[facility] Consult: 10:45 AM 05/07/2019  LOS: 1 day    Referring Provider: Dr Gilford Raid, CCm  Primary Care Physician:  Gildardo Pounds, NP Primary Gastroenterologist:  Dr Allen Norris.  Dr. Dorcas Mcmurray at [facility].      Reason for Consultation: Hepatic encephalopathy.  Acute hepatitis.   HPI: William Nguyen is a 49 y.o. male.  Past medical history Htn.  Gout.  Polysubstance abuse (cocaine positive 03/2019), alcohol abuse.  Hepatic steatosis/hepatitis.  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  Required PRBCs during surgery admission.  Liver biopsy simultaneously showed portal tracts with focal minimal chronic lymphocytic inflammation, mild microvascular steatosis, focal small von Meyenburg complexes, no definite fibrosis or increased iron deposition on stains.  Bleeding gastric ulcers.  Recurrence of left inguinal hernia after repair in 02/2018.  Degenerative L spine disc disease, chronic pain on Tylenol 3 per primary care provider.  Coronary calcifications on CT study in 2018.  02/2019 echocardiogram with LVEF 60 to 65% no LVH, mild to moderate LAE and mild RA dilation, normal RV function Received COVID-19 vaccination on 04/30/2019  Seen in follow-up by Dr. Manuella Ghazi in late 03/2019.  Patient never followed through on surveillance MR abdomen ordered by Dr. Manuella Ghazi.  Patient presented to ED yesterday with generalized swelling in his leg moving into the abdomen.  Patient's had issues with lower extremity edema going back a few years but it has been more of a problem in the last 6 months acutely worsened along with SOB in the past week.  Chest and abdominal pain developed yesterday.  At arrival he was conversant and able to provide good history but subsequently declined.  Labs pertinent for AKI with  anuria, today started CRRT.  Placed on vent for acute, hypercarbic respiratory failure. Hypovolemic vs cardiogenic shock w marked Lactic acidosis.   On vasopressin, levophed.  12.5 mg Albumin x 1, currently infusing.   T bili 3.8 >> 3.1, alk phos 139 >> 109, AST/ALT 679/353 >> 770/164 Ammonia 60 >> 81.  ETOH level is 10 INR 2.4. APAP level less than 10. WBCs 10.9.  Only mild anemia with Hb 12.2.  MCV 106. CTAP w/o contrast: Post surgical changes reflecting central right liver resection and cholecystectomy.  Small volume pelvic ascites, mild body wall edema.  Small right pleural effusion.  Nonobstructing right renal stone. CXR shows cardiomegaly, new bilateral pulmonary infiltrates/edema, small left pleural effusion. KUB today shows air-filled loops of small and large bowel s/o ileus  Social history Lives alone.  Smokes a few cigarettes daily.  Says last alcohol was 11/2018.  Denied IV drug abuse.  Family history Parents and siblings listed as healthy.  Paternal grandmother had heart disease.     Past Medical History:  Diagnosis Date  . Alcohol abuse 10/18/2017   drinks a 6 pack daily  . Cocaine abuse ([place])   . Fracture of angle of right mandible, initial encounter for closed fracture ([place])   . GERD (gastroesophageal reflux disease)    h/o  . Hypertension   .  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 Pneumonia 03/2016  . Reducible left inguinal hernia   . Smoking   . Ulcer    gastric ulcers with bleeding episode    Past Surgical History:  Procedure Laterality Date  . CardioPulmonary Exercise Test: CPX  04/2016   Exercise testing with gas exchange shows a moderate functional limitation due to a circulatory limitation -- Peak VO2 53%.  There were no ST-T wave changes with exercise. Resting spirometry suggests mild restriction. Hypertensive response to exercise.  . hand surgery Right   . INGUINAL HERNIA REPAIR Left 02/17/2018   Procedure: OPEN HERNIA REPAIR  INGUINAL ADULT;  Surgeon: Olean Ree, MD;  Location: [facility] ORS;  Service: General;  Laterality: Left;    Prior to Admission medications   Medication Sig Start Date End Date Taking? Authorizing Provider  albuterol (VENTOLIN HFA) 108 (90 Base) MCG/ACT inhaler INHALE 2 PUFFS INTO THE LUNGS EVERY 6 (SIX) HOURS AS NEEDED FOR WHEEZING OR SHORTNESS OF BREATH. 04/14/19   Gildardo Pounds, NP  allopurinol (ZYLOPRIM) 100 MG tablet Take 2 tablets (200 mg total) by mouth daily. Patient taking differently: Take 100 mg by mouth daily.  07/24/18 04/03/19  Gildardo Pounds, NP  amLODipine (NORVASC) 5 MG tablet Take 1 tablet (5 mg total) by mouth daily. 02/17/19 05/18/19  Gildardo Pounds, NP  Coenzyme Q10 (CO Q-10) 100 MG CAPS Take 100 mg by mouth daily.    [provider]  furosemide (LASIX) 20 MG tablet Take 1 tablet (20 mg total) by mouth daily. 04/22/19 05/22/19  Gildardo Pounds, NP  ibuprofen (ADVIL,MOTRIN) 600 MG tablet Take 1 tablet (600 mg total) by mouth every 8 (eight) hours as needed for fever, mild pain or moderate pain. 02/17/18   Olean Ree, MD  omeprazole (PRILOSEC) 20 MG capsule Take 1 capsule (20 mg total) by mouth 2 (two) times daily before a meal. 04/22/19 07/21/19  Gildardo Pounds, NP    Scheduled Meds: . Chlorhexidine Gluconate Cloth  6 each Topical Daily  . fentaNYL (SUBLIMAZE) injection  100 mcg Intravenous Once  . gabapentin  100 mg Per Tube Q8H  . lactulose  10 g Per Tube BID  . pantoprazole (PROTONIX) IV  40 mg Intravenous Q24H  . sodium bicarbonate       Infusions: . sodium chloride    . sodium chloride    . albumin human    . fentaNYL infusion INTRAVENOUS 50 mcg/hr (05/07/19 1000)  . norepinephrine (LEVOPHED) Adult infusion    . prismasol BGK 4/2.5 2,000 mL/hr at 05/07/19 0846  .  sodium bicarbonate  infusion 1000 mL 150 mL/hr at 05/07/19 1000  . sodium bicarbonate (isotonic) 1000 mL infusion 150 mL/hr at 05/07/19 0933  . sodium bicarbonate (isotonic) 1000 mL infusion  150 mL/hr at 05/07/19 0933  . vasopressin (PITRESSIN) infusion - *FOR SHOCK* 0.03 Units/min (05/07/19 1000)   PRN Meds: Place/Maintain arterial line **AND** sodium chloride, bisacodyl, dextrose, docusate, fentaNYL, heparin, midazolam, midazolam, sodium chloride   Allergies as of 05/05/2019 - Review Complete 04/09/2019  Allergen Reaction Noted  . Vicodin [hydrocodone-acetaminophen] Nausea And Vomiting 01/22/2018    Family History  Problem Relation Age of Onset  . Healthy Mother   . Healthy Father   . Healthy Sister   . Healthy Brother   . Heart disease Paternal Grandmother        Unsure of details - still living at age    Social History  Socioeconomic History  . Marital status: Single    Spouse name: Not on file  . Number of children: 2  . Years of education: Not on file  . Highest education level: Not on file  Occupational History  . Not on file  Tobacco Use  . Smoking status: Current Every Day Smoker    Packs/day: 0.50    Years: 25.00    Pack years: 12.50    Types: Cigarettes  . Smokeless tobacco: Never Used  Substance and Sexual Activity  . Alcohol use: Not Currently    Alcohol/week: 2.0 standard drinks    Types: 2 Cans of beer per week    Comment: heavier in past, for the past 20 years, but now 12 on weekends  . Drug use: Not Currently    Comment: + UDS FOR COCAINE IN 2103   . Sexual activity: Not Currently  Other Topics Concern  . Not on file  Social History Narrative  . Not on file   Social Determinants of Health   Financial Resource Strain:   . Difficulty of Paying Living Expenses:   Food Insecurity:   . Worried About Charity fundraiser in the Last Year:   . Arboriculturist in the Last Year:   Transportation Needs:   . Film/video editor (Medical):   Marland Kitchen Lack of Transportation (Non-Medical):   Physical Activity:   . Days of Exercise per Week:   . Minutes of Exercise per Session:   Stress:   . Feeling of Stress :   Social Connections:   .  Frequency of Communication with Friends and Family:   . Frequency of Social Gatherings with Friends and Family:   . Attends Religious Services:   . Active Member of Clubs or Organizations:   . Attends Archivist Meetings:   Marland Kitchen Marital Status:   Intimate Partner Violence:   . Fear of Current or Ex-Partner:   . Emotionally Abused:   Marland Kitchen Physically Abused:   . Sexually Abused:     REVIEW OF SYSTEMS: Patient is sedated and intubated, unable to obtain review of systems.  See HPI for pertinent details from other providers notes.   PHYSICAL EXAM: Vital signs in last 24 hours: Vitals:   05/07/19 1015 05/07/19 1030  BP:  (!) 120/55  Pulse: 83 83  Resp: (!) 25 (!) 25  Temp:    SpO2: 99% 98%   Wt Readings from Last 3 Encounters:  05/07/19 83.4 kg  04/03/19 79.4 kg  03/04/19 78.9 kg    General: Other than the fact that he is connected to multiple pumps and CRRT machine and is ventilated, he actually looks healthy.  Minimally responsive to exam Head: No facial asymmetry, no signs of head trauma.  No facial edema Eyes: No scleral icterus.  No conjunctival pallor. Ears: Unable to assess hearing Nose: No discharge, no blood at the nose. Mouth: Intubated and OG tube in place as well. Neck: No mass, no JVD. Lungs: Nonlabored breathing on the vent. Heart: RRR.  No MRG.  S1, S2 present. Abdomen: Soft.  Hypoactive bowel sounds.  No tympany headache or tinkling bowel sounds.  No obvious tenderness.  No HSM, masses, bruits appreciated.  200 to 300 cc of brown watery effluent in OG suction canister. Rectal: Deferred Musc/Skeltl: No significant joint deformities or swelling. Extremities: Some edema in both upper extremities and minimal pedal edema bilaterally. Neurologic: Unable to assess as he is sedated/intubated. Skin: No telangiectasia, no rash, no sores.  Not obviously jaundiced.   Intake/Output from previous day: 03/31 0701 - 04/01 0700 In: 3250.4 [I.V.:2201.3; IV  Piggyback:1049.1] Out: -  Intake/Output this shift: Total I/O In: 1033 [I.V.:615.7; Blood:205; Other:200; IV Piggyback:12.3] Out: -3   LAB RESULTS: Recent Labs    04/22/2019 1645 05/07/19 0146 05/07/19 0335 05/07/19 0444 05/07/19 0843  WBC 10.4  --  10.9*  --   --   HGB 12.1*   < > 11.1* 12.2* 12.2*  HCT 38.4*   < > 35.4* 36.0* 36.0*  PLT 218  --  172  --   --    < > = values in this interval not displayed.   BMET Lab Results  Component Value Date   NA 135 05/07/2019   NA 142 05/07/2019   NA 135 05/07/2019   K 4.1 05/07/2019   K 4.2 05/07/2019   K 4.4 05/07/2019   CL 89 (L) 05/07/2019   CL 94 (L) 05/03/2019   CL 92 (L) 04/24/2019   CO2 8 (L) 05/07/2019   CO2 <7 (L) 04/12/2019   CO2 <7 (L) 04/19/2019   GLUCOSE 95 05/07/2019   GLUCOSE 87 04/17/2019   GLUCOSE 59 (L) 04/17/2019   BUN 39 (H) 05/07/2019   BUN 36 (H) 05/05/2019   BUN 36 (H) 04/11/2019   CREATININE 2.56 (H) 05/07/2019   CREATININE 2.27 (H) 04/29/2019   CREATININE 2.15 (H) 04/18/2019   CALCIUM 8.3 (L) 05/07/2019   CALCIUM 8.9 04/23/2019   CALCIUM 9.8 05/05/2019   LFT Recent Labs    04/27/2019 1645 05/07/19 0335 05/07/19 0701  PROT 7.7 6.5  --   ALBUMIN 4.3 3.7 3.6  AST 679* 770*  --   ALT 353* 164*  --   ALKPHOS 139* 109  --   BILITOT 3.8* 3.1*  --   BILIDIR  --  1.1*  --   IBILI  --  2.0*  --    PT/INR Lab Results  Component Value Date   INR 2.4 (H) 04/12/2019   INR 0.98 06/07/2016   Lipase     Component Value Date/Time   LIPASE 50 04/08/2019 1645    Drugs of Abuse     Component Value Date/Time   LABOPIA NONE DETECTED 04/03/2019 1254   COCAINSCRNUR POSITIVE (A) 04/03/2019 1254   COCAINSCRNUR NONE DETECTED 02/17/2018 0807   LABBENZ NONE DETECTED 04/03/2019 1254   AMPHETMU NONE DETECTED 04/03/2019 1254   THCU NONE DETECTED 04/03/2019 1254   LABBARB NONE DETECTED 04/03/2019 1254     RADIOLOGY STUDIES: CT Abdomen Pelvis Wo Contrast  Result Date: 04/08/2019 CLINICAL DATA:   Elevated lateral sees, abdominal pain, acute renal failure, generalized leg swelling x6 months EXAM: CT ABDOMEN AND PELVIS WITHOUT CONTRAST TECHNIQUE: Multidetector CT imaging of the abdomen and pelvis was performed following the standard protocol without IV contrast. COMPARISON:  04/01/2018 FINDINGS: Lower chest: Small right and trace left pleural effusions. Hepatobiliary: Postsurgical changes related to central right liver resection (series 3/image 32). Prior cholecystectomy. No intrahepatic or extrahepatic ductal dilatation. Pancreas: Within normal limits. Spleen: Within normal limits. Adrenals/Urinary Tract: Adrenal glands are within normal limits. 4 mm nonobstructing right renal calculus (series 3/image 37). Left kidney is within normal limits. No hydronephrosis. Thick-walled bladder, although underdistended. Stomach/Bowel: Stomach is within normal limits. No evidence of bowel obstruction. Appendix is not discretely visualized. Vascular/Lymphatic: No evidence of abdominal aortic aneurysm. Atherosclerotic calcifications of the abdominal aorta and branch vessels. No suspicious abdominopelvic lymphadenopathy. Reproductive: Prostate is unremarkable. Other: Small volume pelvic ascites. Mild body  wall edema. Musculoskeletal: Visualized osseous structures are within normal limits. IMPRESSION: Postsurgical changes related to central right liver resection and cholecystectomy. Small right pleural effusion. Small volume pelvic ascites. Mild body wall edema. 4 mm nonobstructing right renal calculus.  No hydronephrosis. Additional ancillary findings as above. Electronically Signed   By: Julian Hy M.D.   On: 04/12/2019 19:42   DG Chest 1 View  Result Date: 05/07/2019 CLINICAL DATA:  Endotracheally intubated. Encounter for central line placement. EXAM: CHEST  1 VIEW COMPARISON:  Chest radiograph 05/07/2019 FINDINGS: Interval placement of a right IJ approach central venous catheter with tip projecting in the region  of the SVC. An ET tube terminates just above the level of the carina. Unchanged cardiomegaly. Redemonstrated prominence of the interstitial lung markings bilaterally. Small left pleural effusion with interval development of left basilar atelectasis. No evidence of pneumothorax. No acute bony abnormality. Impression #1 below will be called to the ordering clinician or representative by the Radiologist Assistant, and communication documented in the PACS or [employer]. IMPRESSION: Interval intubation and placement of a right IJ approach central venous catheter. The ET tube terminates immediately above the level of the carina. Consider retraction. Persistent prominence of the interstitial lung markings bilaterally which may reflect interstitial edema. Interval development of left basilar atelectasis. Left basilar consolidation cannot be excluded. Persistent small left pleural effusion. Unchanged cardiomegaly. Electronically Signed   By: Kellie Simmering DO   On: 05/07/2019 07:15   DG Chest Port 1 View  Result Date: 05/07/2019 CLINICAL DATA:  Respiratory failure. EXAM: PORTABLE CHEST 1 VIEW COMPARISON:  04/21/2019. FINDINGS: Cardiomegaly. New onset bilateral pulmonary infiltrates/edema. Small left pleural effusion. No pneumothorax. IMPRESSION: Cardiomegaly. New onset bilateral pulmonary infiltrates/edema. Small left pleural effusion. Findings suggest CHF. Electronically Signed   By: Marcello Moores  Register   On: 05/07/2019 05:21   DG Chest Port 1 View  Result Date: 04/30/2019 CLINICAL DATA:  Shortness of breath and chest pain EXAM: PORTABLE CHEST 1 VIEW COMPARISON:  04/03/2019 FINDINGS: Cardiomegaly with mild central vascular congestion. No focal consolidation, pleural effusion, or pneumothorax. IMPRESSION: Cardiomegaly with mild central congestion Electronically Signed   By: Donavan Foil M.D.   On: 04/08/2019 18:11   DG Abd Portable 1V  Result Date: 05/07/2019 CLINICAL DATA:  OG tube placement. EXAM: PORTABLE  ABDOMEN - 1 VIEW COMPARISON:  CT 04/12/2019. FINDINGS: OG tube noted with tip and side hole in the stomach. Air-filled loops of small and large bowel noted suggesting adynamic ileus. Surgical clips right upper quadrant. Atelectatic changes left lung base. IMPRESSION: 1.  OG tube noted with tip and side hole over the stomach. 2. Air-filled loops of small and large bowel noted suggesting adynamic ileus. Electronically Signed   By: Marcello Moores  Register   On: 05/07/2019 09:39     IMPRESSION:   *   Acute liver injury in patient s/p resection of hepatic adenoma 06/7970 w hx alcoholic steatohepatitis.  EtOH level is 10 which is just above normal.  Patient denied alcohol use since October but not clear this is the truth.  No evidence for APAP or salicylate overuse. Hepatitis ABC serologies were -11 months ago.  Had hypotension observed during this admission with blood pressure as low as 86/33 so there could be an element of ischemic liver injury.  Although none of his regular meds are big culprits for liver injury, can never rule out  DILI.  1 dose of 12.5 g / 25% solution IV albumin infusing currently  *    Oliguric  AKI, started on CRRT.  Cannot rule out HRS.  *   Coagulopathy.    *  Progressive abdominal swelling.  Mild pelvic ascites, body wall edema.  Normal albumin.  KUB with SB and colonic ileus.   Home med list includes codeine which can certainly lead to ileus pattern.  *    Respiratory failure, on vent.  *  Chronic MS pain, chronic tylenol #3.    *   Remote reported history of bleeding peptic ulcer disease.  Takes Prilosec 20 twice daily at home and currently on Protonix 40 IV daily.  Home meds also include ibuprofen 600 mg as needed and is not clear if he has been using this recently or not.  *    Macrocytosis with mild anemia.    PLAN:     *    Recheck acute hepatitis serologies.  Check anemia profile.  *    Supportive care which is at maximum currently with CRRT, vent, pressors,  albumin.    *    Given the ileus, would remain n.p.o. for the time being.     *   Continue a few more infusions of albumin every 6 hours?  *    IV Vitamin K x 3 days.  Follow INR along with CMET, CBC.    [facility] was apparently contacted but feels he is better served staying in [place] for the time being   Azucena Freed  05/07/2019, 10:45 AM Phone [place] Attending   I have taken an interval history, reviewed the chart and examined the patient. I agree with the Advanced Practitioner's note, impression and recommendations.     I think it is quite possible he is drinking EtOH and has alcoholic liver disease and liver failure and kidney failure may be hepatorenal syndrome.  I cannot think of any other Tx - plans as above    Gatha Mayer, MD, [facility] 05/07/2019 6:19 PM

## 2019-05-07 NOTE — Procedures (Addendum)
Endotracheal Tube Exchange Procedure Note  Indication for endotracheal tube exxhange: Vti VTe mismatch. Airway Assessment: Mallampati Class: II (hard and soft palate, upper portion of tonsils anduvula visible). Sedation: none and pt previously on fentanyl and s/p RSI from initial intubation. Paralytic: none. Lidocaine: no. Atropine: no. Equipment: ETT size 8 ( cuff checked prior and intact) and Bougie. Cricoid Pressure: no. Number of attempts: 1. ETT location confirmed by by auscultation and ETCO2 monitor. VTi VTe match post exchange Pt had no periods of desaturation during exchange New ETT at 25.   William Nguyen 05/07/2019

## 2019-05-07 NOTE — Progress Notes (Signed)
[facility] Progress Note Patient Name: William Nguyen DOB: 09-02-1970 MRN: [id number]   Date of Service  05/07/2019  HPI/Events of Note  ABG on [place] O2 = 6.993/17.5/93.0.   eICU Interventions  Will order: 1. NaHCO3 200 meq IV now.  2. Increase NaHCO3 IV infusion to 150 mL/hour.  3. Repeat ABG at 5 AM.     Intervention Category Major Interventions: Acid-Base disturbance - evaluation and management  Micaila Ziemba [place] 05/07/2019, 2:01 AM

## 2019-05-07 NOTE — Progress Notes (Signed)
CRITICAL VALUE ALERT  Critical Value:  PH 7.003  Date & Time Notied:  05/07/19 @ 0505  Provider Notified: Warren Lacy  Orders Received/Actions taken: TBD

## 2019-05-07 NOTE — Progress Notes (Signed)
Pharmacy Antibiotic Note  William Nguyen is a 49 y.o. male admitted on 04/07/2019 with leg swelling and SOB.  Pharmacy has been consulted for vancomycin and cefepime dosing for sepsis.  Patient has AKI and was started on CRRT.  Hypothermic, WBC 10.9, LA > 11.  Plan: Vanc 1500mg  IV x 1, then 750mg  IV Q24H Cefepime 2gm IV Q12H Monitor CRRT tolerance/interruptions, clinical progress, vanc level as indicated  Height: 5\' 10"  (177.8 cm) Weight: 83.4 kg (183 lb 13.8 oz) IBW/kg (Calculated) : 73  Temp (24hrs), Avg:93.9 F (34.4 C), Min:92.4 F (33.6 C), Max:97 F (36.1 C)  Recent Labs  Lab 05/05/2019 1645 04/25/2019 1752 04/14/2019 2300 04/23/2019 2315 05/07/19 0335 05/07/19 0701 05/07/19 1052  WBC 10.4  --   --   --  10.9*  --   --   CREATININE 2.15*  --  2.27*  --   --  2.56* 2.30*  LATICACIDVEN  --  >11.0*  --  >11.0*  --  >11.0*  --     Estimated Creatinine Clearance: 41.4 mL/min (A) (by C-G formula based on SCr of 2.3 mg/dL (H)).    Allergies  Allergen Reactions  . Vicodin [Hydrocodone-Acetaminophen] Nausea And Vomiting    Vanc 4/1 >> Cefepime 4/1 >>   3/31 covid / flu / MRSA PCR - negative 3/31 BCx - NGTD  Jacy Howat D. Mina Marble, PharmD, BCPS, [place] 05/07/2019, 12:21 PM

## 2019-05-07 NOTE — Significant Event (Signed)
  Patient's mother, Thayer Headings called back.  After discussion with her husband, they do not want any escalation of care and agree that CPR would be of no benefit if he were to decompensate to that point.    Plan is to continue care as is.  No escalation of care.  They do not want to add a third pressor, therefore Neo d/c'd (it had not been started yet).  Ongoing emotional support offered.    Ellard Artis, RN additionally spoke with Thayer Headings to verify parents wishes.   Orders placed to reflect discussions.  DNR.  No escalation of care.  D/c neo.       Kennieth Rad, MSN, AGACNP-BC [facility] 05/07/2019, 9:20 PM

## 2019-05-07 NOTE — Progress Notes (Signed)
Pt has not urinated since being admitted. Bladder scan completed which showed 0 mL. Elink made aware. Will continue to monitor.

## 2019-05-07 NOTE — Procedures (Signed)
Intubation Procedure Note ZUHAYR DEENEY [id number] January 12, 1971  Procedure: Intubation Indications: Respiratory insufficiency   Decreased compensation in setting of refractory metabolic acidosis  Procedure Details Consent: Unable to obtain consent because of altered level of consciousness. Time Out: Verified patient identification, verified procedure, site/side was marked, verified correct patient position, special equipment/implants available, medications/allergies/relevent history reviewed, required imaging and test results available.  Performed Sig Labs: Refractory acidosis ph <7 despite bicarb gtt and pushes. Persistently elevated lactate.  INR >2  Maximum sterile technique was used including cap, gloves, hand hygiene and mask.  MAC and 4 ETT size 8 secured at 25 at lip Prior to RSI gave 1 amp of bicarb and started on Levophed gtt at 5 mcg RSI: etomidate 20 Fentanyl 186m and Rocuronium 100 mg upon visualization of cords Did not require AMBU bag breaths received passive oxygenation via nasal cannula during intubation   Evaluation Hemodynamic Status: BP stable throughout; O2 sats: stable throughout Patient's Current Condition: stable Complications: No apparent complications Patient did tolerate procedure well. Chest X-ray ordered to verify placement.  CXR: pending.   KRise PaganiniScatliffe 05/07/2019

## 2019-05-07 NOTE — Progress Notes (Signed)
  Notified of decreasing blood pressure.  Aline reads 81/42 (52) and cuff 110/34 (56), either way decreasing MAP.    Patient with refractory acidosis despite CRRT, Bicarb gtt, and max vent settings.  He is maxed on levophed and vasopressin gtt.   Also noted ongoing low temperatures- 94 F, despite bairhugger and blood warmer on CRRT.  Prognosis is poor.   I called his mother, Thayer Headings and updated her on the changes.  I approached the topic of DNR status if things were to continue to decompensate despite all our aggressive medical therapies.  I recommended no CPR if it came to that as we would not have anything else to add or offer at that point as we are currently at max therapies available. I do believe it would only prolong things and cause suffering.  Thayer Headings wanted to discuss with her husband and call back.  They live 20 mins away if he acutely decompensates to call in family.   At this point, we can add a third vasopressor but I doubt it will add significant benefit given refractory acidosis.    Kennieth Rad, MSN, AGACNP-BC [facility] 05/07/2019, 8:35 PM

## 2019-05-07 DEATH — deceased

## 2019-05-08 LAB — BASIC METABOLIC PANEL
Anion gap: 40 — ABNORMAL HIGH (ref 5–15)
BUN: 21 mg/dL — ABNORMAL HIGH (ref 6–20)
CO2: 10 mmol/L — ABNORMAL LOW (ref 22–32)
Calcium: 6.6 mg/dL — ABNORMAL LOW (ref 8.9–10.3)
Chloride: 89 mmol/L — ABNORMAL LOW (ref 98–111)
Creatinine, Ser: 2.02 mg/dL — ABNORMAL HIGH (ref 0.61–1.24)
GFR calc Af Amer: 45 mL/min — ABNORMAL LOW (ref >60)
GFR calc non Af Amer: 38 mL/min — ABNORMAL LOW (ref >60)
Glucose, Bld: 55 mg/dL — ABNORMAL LOW (ref 70–99)
Potassium: 4.8 mmol/L (ref 3.5–5.1)
Sodium: 139 mmol/L (ref 135–145)

## 2019-05-08 LAB — BPAM FFP
Blood Product Expiration Date: 202104062359
Blood Product Expiration Date: 202104062359
ISSUE DATE / TIME: 202104010756
ISSUE DATE / TIME: 202104010756
Unit Type and Rh: 8400
Unit Type and Rh: 8400

## 2019-05-08 LAB — PREPARE FRESH FROZEN PLASMA: Unit division: 0

## 2019-05-08 LAB — GLUCOSE, CAPILLARY
Glucose-Capillary: 69 mg/dL — ABNORMAL LOW (ref 70–99)
Glucose-Capillary: 115 mg/dL — ABNORMAL HIGH (ref 70–99)
Glucose-Capillary: 63 mg/dL — ABNORMAL LOW (ref 70–99)
Glucose-Capillary: 108 mg/dL — ABNORMAL HIGH (ref 70–99)
Glucose-Capillary: 45 mg/dL — ABNORMAL LOW (ref 70–99)

## 2019-05-08 LAB — MAGNESIUM: Magnesium: 2.1 mg/dL (ref 1.7–2.4)

## 2019-05-08 LAB — RENAL FUNCTION PANEL
Albumin: 3.5 g/dL (ref 3.5–5.0)
Anion gap: 40 — ABNORMAL HIGH (ref 5–15)
BUN: 27 mg/dL — ABNORMAL HIGH (ref 6–20)
CO2: 10 mmol/L — ABNORMAL LOW (ref 22–32)
Calcium: 7.1 mg/dL — ABNORMAL LOW (ref 8.9–10.3)
Chloride: 89 mmol/L — ABNORMAL LOW (ref 98–111)
Creatinine, Ser: 2.17 mg/dL — ABNORMAL HIGH (ref 0.61–1.24)
GFR calc Af Amer: 41 mL/min — ABNORMAL LOW (ref >60)
GFR calc non Af Amer: 35 mL/min — ABNORMAL LOW (ref >60)
Glucose, Bld: 81 mg/dL (ref 70–99)
Phosphorus: 5.5 mg/dL — ABNORMAL HIGH (ref 2.5–4.6)
Potassium: 4.2 mmol/L (ref 3.5–5.1)
Sodium: 139 mmol/L (ref 135–145)

## 2019-05-08 LAB — POCT ACTIVATED CLOTTING TIME
Activated Clotting Time: 169 seconds
Activated Clotting Time: 169 seconds
Activated Clotting Time: 180 seconds
Activated Clotting Time: 191 seconds
Activated Clotting Time: 191 seconds
Activated Clotting Time: 202 seconds
Activated Clotting Time: 213 seconds

## 2019-05-08 LAB — PROTIME-INR
INR: 2.7 — ABNORMAL HIGH (ref 0.8–1.2)
Prothrombin Time: 28.9 seconds — ABNORMAL HIGH (ref 11.4–15.2)

## 2019-05-08 LAB — CBC
HCT: 33.4 % — ABNORMAL LOW (ref 39.0–52.0)
Hemoglobin: 10.8 g/dL — ABNORMAL LOW (ref 13.0–17.0)
MCH: 33 pg (ref 26.0–34.0)
MCHC: 32.3 g/dL (ref 30.0–36.0)
MCV: 102.1 fL — ABNORMAL HIGH (ref 80.0–100.0)
Platelets: 122 10*3/uL — ABNORMAL LOW (ref 150–400)
RBC: 3.27 MIL/uL — ABNORMAL LOW (ref 4.22–5.81)
RDW: 16 % — ABNORMAL HIGH (ref 11.5–15.5)
WBC: 14 10*3/uL — ABNORMAL HIGH (ref 4.0–10.5)
nRBC: 1.8 % — ABNORMAL HIGH (ref 0.0–0.2)

## 2019-05-08 LAB — LACTIC ACID, PLASMA: Lactic Acid, Venous: >11 mmol/L (ref 0.5–1.9)

## 2019-05-08 LAB — HEPATIC FUNCTION PANEL
ALT: 507 U/L — ABNORMAL HIGH (ref 0–44)
AST: 1188 U/L — ABNORMAL HIGH (ref 15–41)
Albumin: 3.6 g/dL (ref 3.5–5.0)
Alkaline Phosphatase: 106 U/L (ref 38–126)
Bilirubin, Direct: 0.9 mg/dL — ABNORMAL HIGH (ref 0.0–0.2)
Indirect Bilirubin: 1.9 mg/dL — ABNORMAL HIGH (ref 0.3–0.9)
Total Bilirubin: 2.8 mg/dL — ABNORMAL HIGH (ref 0.3–1.2)
Total Protein: 6.1 g/dL — ABNORMAL LOW (ref 6.5–8.1)

## 2019-05-08 LAB — APTT: aPTT: >200 seconds (ref 24–36)

## 2019-05-08 MED ORDER — POLYVINYL ALCOHOL 1.4 % OP SOLN
1.0000 [drp] | Freq: Four times a day (QID) | OPHTHALMIC | Status: DC | PRN
  Filled 2019-05-08: qty 15

## 2019-05-08 MED ORDER — GLYCOPYRROLATE 0.2 MG/ML IJ SOLN: 0.2000 mg | INTRAMUSCULAR | Status: DC | PRN

## 2019-05-08 MED ORDER — DEXTROSE 50 % IV SOLN
INTRAVENOUS | Status: AC
  Administered 2019-05-08: 25 mL
  Filled 2019-05-08: qty 50

## 2019-05-08 MED ORDER — ACETAMINOPHEN 650 MG RE SUPP: 650.0000 mg | Freq: Four times a day (QID) | RECTAL | Status: DC | PRN

## 2019-05-08 MED ORDER — GLYCOPYRROLATE 1 MG PO TABS: 1.0000 mg | ORAL_TABLET | ORAL | Status: DC | PRN

## 2019-05-08 MED ORDER — ACETAMINOPHEN 325 MG PO TABS: 650.0000 mg | ORAL_TABLET | Freq: Four times a day (QID) | ORAL | Status: DC | PRN

## 2019-05-08 MED ORDER — DEXTROSE 5 % IV SOLN: INTRAVENOUS | Status: DC

## 2019-05-08 MED ORDER — DIPHENHYDRAMINE HCL 50 MG/ML IJ SOLN: 25.0000 mg | INTRAMUSCULAR | Status: DC | PRN

## 2019-05-11 LAB — CULTURE, BLOOD (ROUTINE X 2)
Culture: NO GROWTH
Culture: NO GROWTH
Special Requests: ADEQUATE

## 2019-05-25 ENCOUNTER — Ambulatory Visit: Payer: Self-pay

## 2019-06-06 NOTE — Death Summary Note (Signed)
  Name: HAWKINS DUYCK MRN: [id number] DOB: December 13, 1970 49 y.o.  Date of Admission: 04/07/2019  4:25 PM Date of Discharge: May 20, 2019 Attending Physician: Dr. Baltazar Apo  Discharge Diagnosis: Active Problems:   Metabolic acidosis   Lactic acidosis   Encounter for central line placement   Endotracheally intubated   Acute respiratory failure with hypercapnia (HCC)   Acute liver disease  Cause of death: Acute on chronic liver failure complicated by shock, hepatorenal syndrome, and profound lactic acidosis  Time of death: 1300  Disposition and follow-up:   Mr.Verdun C Stidd was discharged from [facility] in expired condition.    Hospital Course: ZAEL KOECK is a 49 y.o. male who has a PMH including but not limited to hepatic adenoma s/p resection [id number], alcoholic steatohepatitis, HTN, and tobacco dependence who presented to the ED on 4/1 with generalized progressive leg swelling x 6 months and mild dyspnea x a few days. In ED, he was found to have multiple metabolic derangements including CO2 < 7, CBG 59, SCR 2.15, transaminitis, BNP 1146, lactate > 11 and elevated INR. He was admitted for acute on chronic liver failure. Over the course of the next 24 hours he became hypotensive, encephalopathic and was started on CRRT. Unfortunately despite multiple vasopressors (norepinephrine and vaso) he continued to have refractory shock with a lactic acid >11. GOC were discussed with the family and the patient was transitioned to comfort care. Shortly thereafter the patient died.   Signed: Ina Homes, MD 05/20/2019, 6:29 PM

## 2019-06-06 NOTE — Progress Notes (Addendum)
Progress Note    ASSESSMENT AND PLAN:    49 yo male with pmh significant for polysubstance abuse, Etoh abuse, hepatic adenoma s/p partial right hepatectomy, cholestectomy PUD, chronic pain. Presented to ED 3/31 with generalized swelling, SOB. He was acidotic with compensatory respiratory alkalosis.     # Abnormal liver chemistries.   --Enzymes continue to rise. AST now 1188 / ALT was improving but rose overnight from 164 to 507. Tbili 2.8, predominantly indirect.  INR up from 2.4 to 2.7 despite a dose of Vitamin K ( 5 mg daily x 3  Ordered yesterday) --Viral hepatitis studies negative --ischemic in setting of hypotension?  DILI? No evidence for APAP or salicylate overuse. May have some degree of Etoh induced liver injury. --Ammonia 81. Getting lactulose.  --Unable to assess mentation ( sedated on vent) --Apparently [facility] was contacted yesterday and felt patient would be best served staying here.  --Patient is critically ill on pressors. Continue to monitor liver tests including INR. Sounds like at this point parents have decided not to escalate care  # AKI, Hepatorenal syndrome?  --Started on CRRT  # Respiratory failure, on vent  # Macrocytic anemia  Subsequent to this note he was extubated and died. Gatha Mayer, MD, [facility]    SUBJECTIVE    Unobtainable, sedated on vent   OBJECTIVE:     Vital signs in last 24 hours: Temp:  [92.4 F (33.6 C)-97.3 F (36.3 C)] 96.7 F (35.9 C) (04/02 0700) Pulse Rate:  [78-89] 86 (04/02 0822) Resp:  [11-31] 24 (04/02 0822) BP: (57-126)/(27-63) 57/35 (04/02 0822) SpO2:  [95 %-100 %] 95 % (04/02 0822) Arterial Line BP: (60-113)/(37-55) 60/37 (04/02 0800) FiO2 (%):  [40 %] 40 % (04/02 0822) Weight:  [89.1 kg] 89.1 kg (04/02 0449) Last BM Date: (PTA) General:   Sedated on vent Heart:  Generalized edema.   Pulm: On vent   Abdomen:  Soft, mild - ? Moderately distended, subcutaneous edema.  A few bowel sounds.            Neurologic:  sedated   Intake/Output from previous day: 04/01 0701 - 04/02 0700 In: 7926.8 [I.V.:5621.2; Blood:475; NG/GT:150; IV Piggyback:1480.6] Out: 515 [Emesis/NG output:450] Intake/Output this shift: Total I/O In: 218.4 [I.V.:218.4] Out: 1 [Other:1]  Lab Results: Recent Labs    04/22/2019 1645 05/07/19 0146 05/07/19 0335 05/07/19 0444 05/07/19 1142 05/07/19 1811 May 30, 2019 0309  WBC 10.4  --  10.9*  --   --   --  14.0*  HGB 12.1*   < > 11.1*   < > 12.6* 11.6* 10.8*  HCT 38.4*   < > 35.4*   < > 37.0* 34.0* 33.4*  PLT 218  --  172  --   --   --  122*   < > = values in this interval not displayed.   BMET Recent Labs    05/07/19 1556 05/07/19 1556 05/07/19 1811 05/07/19 2241 05-30-2019 0309  NA 139   < > 134* 138 139  K 4.5   < > 4.4 4.5 4.2  CL 92*  --   --  88* 89*  CO2 8*  --   --  9* 10*  GLUCOSE 151*  --   --  108* 81  BUN 38*  --   --  32* 27*  CREATININE 2.56*  --   --  2.21* 2.17*  CALCIUM 7.7*  --   --  7.0* 7.1*   < > = values in  this interval not displayed.   LFT Recent Labs    2019-06-06 0309  PROT 6.1*  ALBUMIN 3.6   3.5  AST 1,188*  ALT 507*  ALKPHOS 106  BILITOT 2.8*  BILIDIR 0.9*  IBILI 1.9*   PT/INR Recent Labs    04/14/2019 2300 06-06-19 0309  LABPROT 25.6* 28.9*  INR 2.4* 2.7*   Hepatitis Panel Recent Labs    05/07/19 1218  HEPBSAG NON REACTIVE  HCVAB NON REACTIVE  HEPAIGM NON REACTIVE  HEPBIGM NON REACTIVE    CT Abdomen Pelvis Wo Contrast  Result Date: 04/27/2019 CLINICAL DATA:  Elevated lateral sees, abdominal pain, acute renal failure, generalized leg swelling x6 months EXAM: CT ABDOMEN AND PELVIS WITHOUT CONTRAST TECHNIQUE: Multidetector CT imaging of the abdomen and pelvis was performed following the standard protocol without IV contrast. COMPARISON:  04/01/2018 FINDINGS: Lower chest: Small right and trace left pleural effusions. Hepatobiliary: Postsurgical changes related to central right liver resection (series 3/image  32). Prior cholecystectomy. No intrahepatic or extrahepatic ductal dilatation. Pancreas: Within normal limits. Spleen: Within normal limits. Adrenals/Urinary Tract: Adrenal glands are within normal limits. 4 mm nonobstructing right renal calculus (series 3/image 37). Left kidney is within normal limits. No hydronephrosis. Thick-walled bladder, although underdistended. Stomach/Bowel: Stomach is within normal limits. No evidence of bowel obstruction. Appendix is not discretely visualized. Vascular/Lymphatic: No evidence of abdominal aortic aneurysm. Atherosclerotic calcifications of the abdominal aorta and branch vessels. No suspicious abdominopelvic lymphadenopathy. Reproductive: Prostate is unremarkable. Other: Small volume pelvic ascites. Mild body wall edema. Musculoskeletal: Visualized osseous structures are within normal limits. IMPRESSION: Postsurgical changes related to central right liver resection and cholecystectomy. Small right pleural effusion. Small volume pelvic ascites. Mild body wall edema. 4 mm nonobstructing right renal calculus.  No hydronephrosis. Additional ancillary findings as above. Electronically Signed   By: Julian Hy M.D.   On: 05/03/2019 19:42   DG Chest 1 View  Result Date: 05/07/2019 CLINICAL DATA:  Endotracheally intubated. Encounter for central line placement. EXAM: CHEST  1 VIEW COMPARISON:  Chest radiograph 05/07/2019 FINDINGS: Interval placement of a right IJ approach central venous catheter with tip projecting in the region of the SVC. An ET tube terminates just above the level of the carina. Unchanged cardiomegaly. Redemonstrated prominence of the interstitial lung markings bilaterally. Small left pleural effusion with interval development of left basilar atelectasis. No evidence of pneumothorax. No acute bony abnormality. Impression #1 below will be called to the ordering clinician or representative by the Radiologist Assistant, and communication documented in the PACS  or [employer]. IMPRESSION: Interval intubation and placement of a right IJ approach central venous catheter. The ET tube terminates immediately above the level of the carina. Consider retraction. Persistent prominence of the interstitial lung markings bilaterally which may reflect interstitial edema. Interval development of left basilar atelectasis. Left basilar consolidation cannot be excluded. Persistent small left pleural effusion. Unchanged cardiomegaly. Electronically Signed   By: Kellie Simmering DO   On: 05/07/2019 07:15   DG Chest Port 1 View  Result Date: 05/07/2019 CLINICAL DATA:  Respiratory failure. EXAM: PORTABLE CHEST 1 VIEW COMPARISON:  05/05/2019. FINDINGS: Cardiomegaly. New onset bilateral pulmonary infiltrates/edema. Small left pleural effusion. No pneumothorax. IMPRESSION: Cardiomegaly. New onset bilateral pulmonary infiltrates/edema. Small left pleural effusion. Findings suggest CHF. Electronically Signed   By: Marcello Moores  Register   On: 05/07/2019 05:21   DG Chest Port 1 View  Result Date: 04/11/2019 CLINICAL DATA:  Shortness of breath and chest pain EXAM: PORTABLE CHEST 1 VIEW COMPARISON:  04/03/2019  FINDINGS: Cardiomegaly with mild central vascular congestion. No focal consolidation, pleural effusion, or pneumothorax. IMPRESSION: Cardiomegaly with mild central congestion Electronically Signed   By: Donavan Foil M.D.   On: 04/18/2019 18:11   DG Abd Portable 1V  Result Date: 05/07/2019 CLINICAL DATA:  OG tube placement. EXAM: PORTABLE ABDOMEN - 1 VIEW COMPARISON:  CT 04/29/2019. FINDINGS: OG tube noted with tip and side hole in the stomach. Air-filled loops of small and large bowel noted suggesting adynamic ileus. Surgical clips right upper quadrant. Atelectatic changes left lung base. IMPRESSION: 1.  OG tube noted with tip and side hole over the stomach. 2. Air-filled loops of small and large bowel noted suggesting adynamic ileus. Electronically Signed   By: Marcello Moores  Register   On:  05/07/2019 09:39   ECHOCARDIOGRAM LIMITED  Result Date: 05/07/2019    ECHOCARDIOGRAM LIMITED REPORT   Patient Name:   William Nguyen Date of Exam: 05/07/2019 Medical Rec #:  [id number]     Height:       70.0 in Accession #:    [id number]    Weight:       183.9 lb Date of Birth:  March 13, 1970      BSA:          2.014 m Patient Age:    60 years      BP:           119/57 mmHg Patient Gender: M             HR:           86 bpm. Exam Location:  Inpatient Procedure: Limited Echo, Cardiac Doppler and Color Doppler Indications:    Shock  History:        Patient has prior history of Echocardiogram examinations, most                 recent 05/07/2019. CHF; Risk Factors:Current Smoker and                 Hypertension. Hepatic adenoma, AKI.  Sonographer:    Dustin Flock Referring Phys: [phone] [place]  1. Limited study; not all views obtained; normal LV function; severe TR (leaflets do not coapt); severe RAE and RVE; moderate RV dysfunction.  2. Left ventricular ejection fraction, by estimation, is 65 to 70%. The left ventricle has normal function. The left ventricle has no regional wall motion abnormalities. Left ventricular diastolic parameters were normal.  3. Right ventricular systolic function is moderately reduced. The right ventricular size is severely enlarged.  4. Right atrial size was severely dilated.  5. The mitral valve is normal in structure. No evidence of mitral valve regurgitation.  6. Tricuspid valve regurgitation is severe.  7. The aortic valve was not assessed.  8. The inferior vena cava is dilated in size with <50% respiratory variability, suggesting right atrial pressure of 15 mmHg. FINDINGS  Left Ventricle: Left ventricular ejection fraction, by estimation, is 65 to 70%. The left ventricle has normal function. The left ventricle has no regional wall motion abnormalities. The left ventricular internal cavity size was normal in size. There is  no left ventricular hypertrophy. Right Ventricle:  The right ventricular size is severely enlarged. Right ventricular systolic function is moderately reduced. Left Atrium: Left atrial size was normal in size. Right Atrium: Right atrial size was severely dilated. Pericardium: There is no evidence of pericardial effusion. Mitral Valve: The mitral valve is normal in structure. Tricuspid Valve: Tricuspid valve regurgitation is severe. Aortic Valve:  The aortic valve was not assessed. Pulmonic Valve: The pulmonic valve was not assessed. Venous: The inferior vena cava is dilated in size with less than 50% respiratory variability, suggesting right atrial pressure of 15 mmHg. IAS/Shunts: There is left bowing of the interatrial septum, suggestive of elevated right atrial pressure. Additional Comments: Limited study; not all views obtained; normal LV function; severe TR (leaflets do not coapt); severe RAE and RVE; moderate RV dysfunction.  LEFT VENTRICLE PLAX 2D LVIDd:         4.90 cm Diastology LVIDs:         2.80 cm LV e' lateral:   11.60 cm/s LV PW:         1.20 cm LV E/e' lateral: 8.6 LV IVS:        0.90 cm LV e' medial:    8.27 cm/s                        LV E/e' medial:  12.1  MITRAL VALVE MV Area (PHT): 3.85 cm MV Decel Time: 197 msec MV E velocity: 100.00 cm/s MV A velocity: 78.00 cm/s MV E/A ratio:  1.28 Kirk Ruths MD Electronically signed by Kirk Ruths MD Signature Date/Time: 05/07/2019/3:21:26 PM    Final      Active Problems:   Metabolic acidosis   Lactic acidosis   Encounter for central line placement   Endotracheally intubated   Acute respiratory failure with hypercapnia ([place])   Acute liver disease     LOS: 2 days   Tye Savoy ,NP 05/28/2019, 8:54 AM

## 2019-06-06 NOTE — Progress Notes (Signed)
CRITICAL VALUE ALERT  Critical Value:  APTT > 200  Date & Time Notied:  May 28, 2019 @ 0530  Provider Notified: Warren Lacy  Orders Received/Actions taken: TBD

## 2019-06-06 NOTE — Progress Notes (Signed)
Patient extubated per withdrawal order to room air. RN at bedside, family brought to bedside following extubation.

## 2019-06-06 NOTE — Progress Notes (Addendum)
NAME:  William Nguyen, MRN:  [id number], DOB:  [birth year], LOS: 2 ADMISSION DATE:  04/26/2019, CONSULTATION DATE:  05/04/2019 REFERRING MD:  Ralene Bathe  CHIEF COMPLAINT:  SOB   Brief History   William Nguyen is a 49 y.o. male who presented 3/31 with AKI and profound lactic acidosis due to impaired hepatic clearance and with multiple metabolic derangements.  History of present illness   William Nguyen is a 49 y.o. male who has a PMH including but not limited to hepatic adenoma s/p resection [id number], alcoholic steatohepatitis, HTN, tobacco dependence (see "past medical history" for rest).  He presented to [facility] ED 3/31 with generalized progressive leg swelling x 6 months and mild dyspnea x a few days.  On day of presentation, he also developed central chest and abdominal pain.  No known fevers/chills/sweats, headaches, cough, N/V/D, myalgias, exposures to known sick contacts.  He has a hx of partial right hepatectomy on 06/27/18 for hepatic adenoma (followed at [facility] with plans for MRI surveillance imaging moving forward) and states that he is compliant with all of his medications.  He has a hx of EtOH use but states last drink was in October 2020.  He does smoke cigarettes, 5-6 per day.  In ED, he was found to have multiple metabolic derangements including CO2 < 7, CBG 59, SCR 2.15, transaminitis, BNP 1146, lactate > 11.    Past Medical History  has Pneumonia; Liver mass, right lobe; Hepatic adenoma; Dyspnea on exertion; Tobacco smoker, less than 10 cigarettes per day; Coronary artery calcification seen on CAT scan; Abnormal finding on cardiovascular stress test; Reducible left inguinal hernia; Leg swelling; Hypertension; Diastolic dysfunction without heart failure; Elevated lactic acid level; Metabolic acidosis; AKI (acute kidney injury) ([place]); Acute hypoxemic respiratory failure ([place]); Transaminitis; Lactic acidosis; Alcoholic steatohepatitis; Acute on chronic heart failure with preserved ejection fraction ([place]); H/O ETOH  abuse; Tobacco dependence; Encounter for central line placement; Endotracheally intubated; Acute respiratory failure with hypercapnia ([place]); and Acute liver disease on their problem list.  Significant Hospital Events   3/31 > admit. 4/1 > Intubated  Consults:  None.  Procedures:  None.  Significant Diagnostic Tests:  CT A / P 3/31 > small right pleural effusion, small volume ascites, 67mm nonobstructing right renal calculus.  No hydronephrosis.  Micro Data:  Flu 3/31 >  COVID 3/31 >  Blood 3/31 >   Antimicrobials:  3/31 > Zosyn x 1. 4/1 Cefepime >  4/1 Vancomycin >  Interim history/subjective:  Now intubated and sedated.  Does not respond to verbal or physical stimuli  Transitioned to DNR overnight. Continue current care but do not escalate.   Objective:  Blood pressure (!) 89/50, pulse 84, temperature (!) 97.2 F (36.2 C), temperature source Oral, resp. rate (!) 24, height 5\' 10"  (1.778 m), weight 89.1 kg, SpO2 98 %.    Vent Mode: PRVC FiO2 (%):  [40 %-100 %] 40 % Set Rate:  [24 bmp] 24 bmp Vt Set:  [580 mL] 580 mL PEEP:  [5 cmH20] 5 cmH20 Plateau Pressure:  [21 cmH20-28 cmH20] 28 cmH20   Intake/Output Summary (Last 24 hours) at May 09, 2019 0643 Last data filed at 05/09/2019 0600 Gross per 24 hour  Intake 7849.1 ml  Output 518 ml  Net 7331.1 ml   Filed Weights   04/22/2019 1638 05/07/19 0000 May 09, 2019 0449  Weight: 80 kg 83.4 kg 89.1 kg    Examination: General: middle aged male of normal body habitus in NAD Neuro: sedated on vent HEENT: [place]/AT.  Sclerae anicteric. Unable to appreciate JVD Cardiovascular: RRR, no M/R/G.  Lungs: Clear bilateral breath sounds.  Abdomen: BS x 4, soft, NT/ND.  Musculoskeletal: No gross deformities, 2-3+ pitting edema up to the hips. Skin: Intact, warm, no rashes.  Assessment & Plan:   Acute on chronic ALF complicated by shock, hepatorenal syndrome, and profound lactic acidosis  - Became unresponsive, worsening shock, and  persistently elevated LA  - Nephrology onboard. Continuing Bicarb and CRRT. Keeping even. Minimal UOP - ICU hemodynamic monitoring - Norepinephrine titrated and vasopressin to MAP > 65 mmHg - Careful with IVF  - S/p albumin infusion    - Trend LFT's. - Coags remain elevated with INR of 2.7 and aPTT > 200. INR unchanged s/p Vitamin K - Lactulose  - Consider SBP prophylaxis with cefepime  - Appreciate GI consultation  Acute hypercarbic respiratory failure: unable to keep up with compensatory demands of metabolic acidosis.  - Full vent support - ABG reviewed and settings adjusted - Fentanyl infusion at 75 mcg. Pin point pupils on exam. Decrease rate with RASS goal -1 to -2.  - VAP prevention bundle - CXR PRN  Hypoglycemia - ? Due to underlying hepatic disease / acute injury. - Q4hr CBG checks. - D50 PRN for now for CBG < 70, can consider D5 or D10 if persists.  Best Practice:  Diet: NPO. Consider TF 4/2 Pain/Anxiety/Delirium protocol (if indicated): Fenatnyl infusion per PAD protocol VAP protocol (if indicated): Yes DVT prophylaxis: SCDs, coagulopathic GI prophylaxis: PPI Glucose control: PPI Mobility: Bedrest:  Code Status: DNR Family Communication: Mother updated Disposition: ICU.  Labs   CBC: Recent Labs  Lab 04/15/2019 1645 05/07/19 0146 05/07/19 0335 05/07/19 0335 05/07/19 0444 05/07/19 0843 05/07/19 1142 05/07/19 1811 2019/06/05 0309  WBC 10.4  --  10.9*  --   --   --   --   --  14.0*  HGB 12.1*   < > 11.1*   < > 12.2* 12.2* 12.6* 11.6* 10.8*  HCT 38.4*   < > 35.4*   < > 36.0* 36.0* 37.0* 34.0* 33.4*  MCV 105.2*  --  106.9*  --   --   --   --   --  102.1*  PLT 218  --  172  --   --   --   --   --  122*   < > = values in this interval not displayed.   Basic Metabolic Panel: Recent Labs  Lab 05/07/19 0335 05/07/19 0444 05/07/19 0701 05/07/19 0843 05/07/19 1052 05/07/19 1052 05/07/19 1142 05/07/19 1556 05/07/19 1811 05/07/19 2241 06-05-19 0309  NA  --     < > 142   < > 141   < > 137 139 134* 138 139  K  --    < > 4.2   < > 4.3   < > 4.0 4.5 4.4 4.5 4.2  CL  --   --  89*  --  89*  --   --  92*  --  88* 89*  CO2  --   --  8*  --  8*  --   --  8*  --  9* 10*  GLUCOSE  --   --  95  --  141*  --   --  151*  --  108* 81  BUN  --   --  39*  --  37*  --   --  38*  --  32* 27*  CREATININE  --   --  2.56*  --  2.30*  --   --  2.56*  --  2.21* 2.17*  CALCIUM  --   --  8.3*  --  8.0*  --   --  7.7*  --  7.0* 7.1*  MG 2.3  --   --   --   --   --   --   --   --   --  2.1  PHOS 9.2*  --  9.1*  --   --   --   --  9.2*  --   --  5.5*   < > = values in this interval not displayed.   GFR: Estimated Creatinine Clearance: 47.8 mL/min (A) (by C-G formula based on SCr of 2.17 mg/dL (H)). Recent Labs  Lab 04/27/2019 1645 05/04/2019 1752 04/19/2019 2315 05/07/19 0335 05/07/19 0701 05/07/19 1557 May 29, 2019 0309  WBC 10.4  --   --  10.9*  --   --  14.0*  LATICACIDVEN  --    < > >11.0*  --  >11.0* >11.0* >11.0*   < > = values in this interval not displayed.   Liver Function Tests: Recent Labs  Lab 04/18/2019 1645 05/07/19 0335 05/07/19 0701 05/07/19 1556 29-May-2019 0309  AST 679* 770*  --   --  1,188*  ALT 353* 164*  --   --  507*  ALKPHOS 139* 109  --   --  106  BILITOT 3.8* 3.1*  --   --  2.8*  PROT 7.7 6.5  --   --  6.1*  ALBUMIN 4.3 3.7 3.6 3.8 3.6  3.5   Recent Labs  Lab 04/26/2019 1645  LIPASE 50   Recent Labs  Lab 05/05/2019 1645 05/07/19 0701  AMMONIA 60* 81*   ABG    Component Value Date/Time   PHART 7.157 (LL) 05/07/2019 1811   PCO2ART 22.9 (L) 05/07/2019 1811   PO2ART 92.0 05/07/2019 1811   HCO3 8.4 (L) 05/07/2019 1811   TCO2 9 (L) 05/07/2019 1811   ACIDBASEDEF 19.0 (H) 05/07/2019 1811   O2SAT 96.0 05/07/2019 1811    Coagulation Profile: Recent Labs  Lab 04/23/2019 2300 May 29, 2019 0309  INR 2.4* 2.7*   Cardiac Enzymes: No results for input(s): CKTOTAL, CKMB, CKMBINDEX, TROPONINI in the last 168 hours. HbA1C: Hgb A1c MFr Bld    Date/Time Value Ref Range Status  02/18/2019 10:31 AM 4.9 4.8 - 5.6 % Final    Comment:             Prediabetes: 5.7 - 6.4          Diabetes: >6.4          Glycemic control for adults with diabetes: <7.0    CBG: Recent Labs  Lab 05/07/19 1507 05/07/19 1926 05/07/19 2334 05/29/19 0318 May 29, 2019 0344  GLUCAP 148* 123* 107* 69* 115*       Ina Homes, MD  [facility] PGY3  See Amion for personal pager PCCM on call pager [phone]  May 29, 2019 6:43 AM

## 2019-06-06 NOTE — Plan of Care (Signed)
  Problem: Skin Integrity: Goal: Risk for impaired skin integrity will decrease Outcome: Progressing   Problem: Activity: Goal: Ability to tolerate increased activity will improve Outcome: Progressing   Problem: Respiratory: Goal: Ability to maintain a clear airway and adequate ventilation will improve Outcome: Progressing

## 2019-06-06 NOTE — Progress Notes (Signed)
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  History of right partial hepatic ectomy 06/27/2018 followed at [facility].  Baseline serum creatinine 0.7 mg/dL.  He presented to the emergency room 04/22/2019 with increased lower extremity edema increase shortness of breath found to have a friend acidosis with a CO2 < 7.  His serum creatinine was 2.15 mg deciliter lactate level greater than 11.  CT scan of abdomen 05/05/2019 noncontrast field right pleural effusion small ascites nonobstructing renal calculus.  Chest x-ray was positive for new onset bilateral pulmonary infiltrates left pleural effusion.  2D echo EF 60-65% mild mitral regurgitation.  Large volume resuscitation poor urine output.  Dialysis catheter placed 05/07/2019 CRRT initiated.  Blood pressure 83/37 pulse 83 temperature 97.0 O2 sats 97% 40% FiO2  Sodium 139 potassium 4.2 chloride 89 CO2 10 BUN 81 creatinine 2.17 calcium 7.1 phosphorus 5.5 magnesium 2.1 albumin 3.5 AST 1188 ALT 507 bilirubin 0.9 WBC 14 hemoglobin 10.8 platelets 122  Gabapentin 100 mg every 8 hours, Protonix 40 mg every 24 hours cefepime 2 g every 12 hours, IV heparin, norepinephrine IV, IV sodium bicarbonate 150 cc an hour, vancomycin, vasopressin IV  Urinalysis negative for red cells negative white blood cells Minimal urine output   Objective:  Vital signs in last 24 hours:  Temp:  [92.4 F (33.6 C)-97.3 F (36.3 C)] 96.7 F (35.9 C) (04/02 0700) Pulse Rate:  [74-89] 84 (04/02 0600) Resp:  [11-31] 24 (04/02 0600) BP: (89-126)/(27-63) 89/50 (04/02 0600) SpO2:  [95 %-100 %] 98 % (04/02 0600) Arterial Line BP: (67-113)/(37-55) 67/43 (04/02 0600) FiO2 (%):  [40 %-50 %] 40 % (04/02 0316) Weight:  [89.1 kg] 89.1 kg (04/02 0449)  Weight change: 9.1 kg Filed Weights   04/21/2019 1638 05/07/19 0000 2019/05/09 0449   Weight: 80 kg 83.4 kg 89.1 kg    Intake/Output: I/O last 3 completed shifts: In: 11177.2 [I.V.:7822.5; Blood:475; Other:200; NG/GT:150; IV Piggyback:2529.7] Out: 515 [Emesis/NG output:450; Other:65]   Intake/Output this shift:  Total I/O In: 218.4 [I.V.:218.4] Out: 1 [Other:1]  CVS- RRR no murmurs rubs gallops RS- CTA mechanically supported breath sounds ABD- BS present soft non-distended EXT- no edema   Basic Metabolic Panel: Recent Labs  Lab 05/07/19 0335 05/07/19 0444 05/07/19 0701 05/07/19 0843 05/07/19 1052 05/07/19 1052 05/07/19 1142 05/07/19 1556 05/07/19 1811 05/07/19 2241 05-09-19 0309  NA  --    < > 142   < > 141   < > 137 139 134* 138 139  K  --    < > 4.2   < > 4.3   < > 4.0 4.5 4.4 4.5 4.2  CL  --   --  89*  --  89*  --   --  92*  --  88* 89*  CO2  --   --  8*  --  8*  --   --  8*  --  9* 10*  GLUCOSE  --   --  95  --  141*  --   --  151*  --  108* 81  BUN  --   --  39*  --  37*  --   --  38*  --  32* 27*  CREATININE  --   --  2.56*  --  2.30*  --   --  2.56*  --  2.21* 2.17*  CALCIUM  --   --  8.3*   < > 8.0*   < >  --  7.7*  --  7.0* 7.1*  MG 2.3  --   --   --   --   --   --   --   --   --  2.1  PHOS 9.2*  --  9.1*  --   --   --   --  9.2*  --   --  5.5*   < > = values in this interval not displayed.    Liver Function Tests: Recent Labs  Lab 05/05/2019 1645 05/07/19 0335 05/07/19 0701 05/07/19 1556 2019-06-06 0309  AST 679* 770*  --   --  1,188*  ALT 353* 164*  --   --  507*  ALKPHOS 139* 109  --   --  106  BILITOT 3.8* 3.1*  --   --  2.8*  PROT 7.7 6.5  --   --  6.1*  ALBUMIN 4.3 3.7 3.6 3.8 3.6  3.5   Recent Labs  Lab 05/03/2019 1645  LIPASE 50   Recent Labs  Lab 05/04/2019 1645 05/07/19 0701  AMMONIA 60* 81*    CBC: Recent Labs  Lab 05/02/2019 1645 05/07/19 0146 05/07/19 0335 05/07/19 0335 05/07/19 0444 05/07/19 0843 05/07/19 1142 05/07/19 1811 06/06/19 0309  WBC 10.4  --  10.9*  --   --   --   --   --  14.0*  HGB 12.1*    < > 11.1*   < > 12.2* 12.2* 12.6* 11.6* 10.8*  HCT 38.4*   < > 35.4*   < > 36.0* 36.0* 37.0* 34.0* 33.4*  MCV 105.2*  --  106.9*  --   --   --   --   --  102.1*  PLT 218  --  172  --   --   --   --   --  122*   < > = values in this interval not displayed.    Cardiac Enzymes: No results for input(s): CKTOTAL, CKMB, CKMBINDEX, TROPONINI in the last 168 hours.  BNP: Invalid input(s): POCBNP  CBG: Recent Labs  Lab 05/07/19 1926 05/07/19 2334 Jun 06, 2019 0318 06-Jun-2019 0344 06-06-19 0758  GLUCAP 123* 107* 45* 115* 108*    Microbiology: Results for orders placed or performed during the hospital encounter of 05/01/2019  Culture, blood (routine x 2)     Status: None (Preliminary result)   Collection Time: 04/17/2019  5:50 PM   Specimen: BLOOD  Result Value Ref Range Status   Specimen Description BLOOD LEFT ANTECUBITAL  Final   Special Requests   Final    BOTTLES DRAWN AEROBIC AND ANAEROBIC Blood Culture adequate volume   Culture   Final    NO GROWTH < 24 HOURS Performed at [facility], [place] [address], [place], [place] [address]    Report Status PENDING  Incomplete  Culture, blood (routine x 2)     Status: None (Preliminary result)   Collection Time: 05/01/2019  6:20 PM   Specimen: BLOOD  Result Value Ref Range Status   Specimen Description BLOOD RIGHT ANTECUBITAL  Final   Special Requests   Final    BOTTLES DRAWN AEROBIC ONLY Blood Culture results may not be optimal due to an inadequate volume of blood received in culture bottles   Culture   Final    NO GROWTH < 12 HOURS Performed at [facility], [place] [address] [place], [place] [address]    Report Status  PENDING  Incomplete  Respiratory Panel by RT PCR (Flu A&B, Covid) - Nasopharyngeal Swab     Status: None   Collection Time: 04/24/2019  8:50 PM   Specimen: Nasopharyngeal Swab  Result Value Ref Range Status   SARS Coronavirus 2 by RT PCR NEGATIVE NEGATIVE Final    Comment: (NOTE) SARS-CoV-2 target nucleic acids are  NOT DETECTED. The SARS-CoV-2 RNA is generally detectable in upper respiratoy specimens during the acute phase of infection. The lowest concentration of SARS-CoV-2 viral copies this assay can detect is 131 copies/mL. A negative result does not preclude SARS-Cov-2 infection and should not be used as the sole basis for treatment or other patient management decisions. A negative result may occur with  improper specimen collection/handling, submission of specimen other than nasopharyngeal swab, presence of viral mutation(s) within the areas targeted by this assay, and inadequate number of viral copies (<131 copies/mL). A negative result must be combined with clinical observations, patient history, and epidemiological information. The expected result is Negative. Fact Sheet for Patients:  PinkCheek.be Fact Sheet for Healthcare Providers:  GravelBags.it This test is not yet ap proved or cleared by the [place] FDA and  has been authorized for detection and/or diagnosis of SARS-CoV-2 by FDA under an Emergency Use Authorization (EUA). This EUA will remain  in effect (meaning this test can be used) for the duration of the COVID-19 declaration under Section 564(b)(1) of the Act, 21 U.S.C. section 360bbb-3(b)(1), unless the authorization is terminated or revoked sooner.    Influenza A by PCR NEGATIVE NEGATIVE Final   Influenza B by PCR NEGATIVE NEGATIVE Final    Comment: (NOTE) The Xpert Xpress SARS-CoV-2/FLU/RSV assay is intended as an aid in  the diagnosis of influenza from Nasopharyngeal swab specimens and  should not be used as a sole basis for treatment. Nasal washings and  aspirates are unacceptable for Xpert Xpress SARS-CoV-2/FLU/RSV  testing. Fact Sheet for Patients: PinkCheek.be Fact Sheet for Healthcare Providers: GravelBags.it This test is not yet approved or  cleared by the [place] FDA and  has been authorized for detection and/or diagnosis of SARS-CoV-2 by  FDA under an Emergency Use Authorization (EUA). This EUA will remain  in effect (meaning this test can be used) for the duration of the  Covid-19 declaration under Section 564(b)(1) of the Act, 21  U.S.C. section 360bbb-3(b)(1), unless the authorization is  terminated or revoked. Performed at [facility], [place] [address] [place], [place] [address]   MRSA PCR Screening     Status: None   Collection Time: 05/07/19 12:20 AM   Specimen: Nasopharyngeal  Result Value Ref Range Status   MRSA by PCR NEGATIVE NEGATIVE Final    Comment:        The GeneXpert MRSA Assay (FDA approved for NASAL specimens only), is one component of a comprehensive MRSA colonization surveillance program. It is not intended to diagnose MRSA infection nor to guide or monitor treatment for MRSA infections. Performed at [facility], [place] [address] [place], [place] [address]     Coagulation Studies: Recent Labs    05/01/2019 2300 06/03/19 0309  LABPROT 25.6* 28.9*  INR 2.4* 2.7*    Urinalysis: Recent Labs    04/27/2019 1747  COLORURINE AMBER*  LABSPEC 1.014  PHURINE 5.0  GLUCOSEU NEGATIVE  HGBUR NEGATIVE  BILIRUBINUR NEGATIVE  KETONESUR NEGATIVE  PROTEINUR 100*  NITRITE NEGATIVE  LEUKOCYTESUR NEGATIVE      Imaging: CT Abdomen Pelvis Wo Contrast  Result Date: 04/17/2019  CLINICAL DATA:  Elevated lateral sees, abdominal pain, acute renal failure, generalized leg swelling x6 months EXAM: CT ABDOMEN AND PELVIS WITHOUT CONTRAST TECHNIQUE: Multidetector CT imaging of the abdomen and pelvis was performed following the standard protocol without IV contrast. COMPARISON:  04/01/2018 FINDINGS: Lower chest: Small right and trace left pleural effusions. Hepatobiliary: Postsurgical changes related to central right liver resection (series 3/image 32). Prior cholecystectomy. No intrahepatic or  extrahepatic ductal dilatation. Pancreas: Within normal limits. Spleen: Within normal limits. Adrenals/Urinary Tract: Adrenal glands are within normal limits. 4 mm nonobstructing right renal calculus (series 3/image 37). Left kidney is within normal limits. No hydronephrosis. Thick-walled bladder, although underdistended. Stomach/Bowel: Stomach is within normal limits. No evidence of bowel obstruction. Appendix is not discretely visualized. Vascular/Lymphatic: No evidence of abdominal aortic aneurysm. Atherosclerotic calcifications of the abdominal aorta and branch vessels. No suspicious abdominopelvic lymphadenopathy. Reproductive: Prostate is unremarkable. Other: Small volume pelvic ascites. Mild body wall edema. Musculoskeletal: Visualized osseous structures are within normal limits. IMPRESSION: Postsurgical changes related to central right liver resection and cholecystectomy. Small right pleural effusion. Small volume pelvic ascites. Mild body wall edema. 4 mm nonobstructing right renal calculus.  No hydronephrosis. Additional ancillary findings as above. Electronically Signed   By: Julian Hy M.D.   On: 04/13/2019 19:42   DG Chest 1 View  Result Date: 05/07/2019 CLINICAL DATA:  Endotracheally intubated. Encounter for central line placement. EXAM: CHEST  1 VIEW COMPARISON:  Chest radiograph 05/07/2019 FINDINGS: Interval placement of a right IJ approach central venous catheter with tip projecting in the region of the SVC. An ET tube terminates just above the level of the carina. Unchanged cardiomegaly. Redemonstrated prominence of the interstitial lung markings bilaterally. Small left pleural effusion with interval development of left basilar atelectasis. No evidence of pneumothorax. No acute bony abnormality. Impression #1 below will be called to the ordering clinician or representative by the Radiologist Assistant, and communication documented in the PACS or [employer]. IMPRESSION: Interval  intubation and placement of a right IJ approach central venous catheter. The ET tube terminates immediately above the level of the carina. Consider retraction. Persistent prominence of the interstitial lung markings bilaterally which may reflect interstitial edema. Interval development of left basilar atelectasis. Left basilar consolidation cannot be excluded. Persistent small left pleural effusion. Unchanged cardiomegaly. Electronically Signed   By: Kellie Simmering DO   On: 05/07/2019 07:15   DG Chest Port 1 View  Result Date: 05/07/2019 CLINICAL DATA:  Respiratory failure. EXAM: PORTABLE CHEST 1 VIEW COMPARISON:  04/19/2019. FINDINGS: Cardiomegaly. New onset bilateral pulmonary infiltrates/edema. Small left pleural effusion. No pneumothorax. IMPRESSION: Cardiomegaly. New onset bilateral pulmonary infiltrates/edema. Small left pleural effusion. Findings suggest CHF. Electronically Signed   By: Marcello Moores  Register   On: 05/07/2019 05:21   DG Chest Port 1 View  Result Date: 04/08/2019 CLINICAL DATA:  Shortness of breath and chest pain EXAM: PORTABLE CHEST 1 VIEW COMPARISON:  04/03/2019 FINDINGS: Cardiomegaly with mild central vascular congestion. No focal consolidation, pleural effusion, or pneumothorax. IMPRESSION: Cardiomegaly with mild central congestion Electronically Signed   By: Donavan Foil M.D.   On: 04/15/2019 18:11   DG Abd Portable 1V  Result Date: 05/07/2019 CLINICAL DATA:  OG tube placement. EXAM: PORTABLE ABDOMEN - 1 VIEW COMPARISON:  CT 04/28/2019. FINDINGS: OG tube noted with tip and side hole in the stomach. Air-filled loops of small and large bowel noted suggesting adynamic ileus. Surgical clips right upper quadrant. Atelectatic changes left lung base. IMPRESSION: 1.  OG tube noted with  tip and side hole over the stomach. 2. Air-filled loops of small and large bowel noted suggesting adynamic ileus. Electronically Signed   By: Marcello Moores  Register   On: 05/07/2019 09:39   ECHOCARDIOGRAM  LIMITED  Result Date: 05/07/2019    ECHOCARDIOGRAM LIMITED REPORT   Patient Name:   William Nguyen Date of Exam: 05/07/2019 Medical Rec #:  [id number]     Height:       70.0 in Accession #:    [id number]    Weight:       183.9 lb Date of Birth:  06/06/1970      BSA:          2.014 m Patient Age:    83 years      BP:           119/57 mmHg Patient Gender: M             HR:           86 bpm. Exam Location:  Inpatient Procedure: Limited Echo, Cardiac Doppler and Color Doppler Indications:    Shock  History:        Patient has prior history of Echocardiogram examinations, most                 recent 05/07/2019. CHF; Risk Factors:Current Smoker and                 Hypertension. Hepatic adenoma, AKI.  Sonographer:    Dustin Flock Referring Phys: [phone] [place]  1. Limited study; not all views obtained; normal LV function; severe TR (leaflets do not coapt); severe RAE and RVE; moderate RV dysfunction.  2. Left ventricular ejection fraction, by estimation, is 65 to 70%. The left ventricle has normal function. The left ventricle has no regional wall motion abnormalities. Left ventricular diastolic parameters were normal.  3. Right ventricular systolic function is moderately reduced. The right ventricular size is severely enlarged.  4. Right atrial size was severely dilated.  5. The mitral valve is normal in structure. No evidence of mitral valve regurgitation.  6. Tricuspid valve regurgitation is severe.  7. The aortic valve was not assessed.  8. The inferior vena cava is dilated in size with <50% respiratory variability, suggesting right atrial pressure of 15 mmHg. FINDINGS  Left Ventricle: Left ventricular ejection fraction, by estimation, is 65 to 70%. The left ventricle has normal function. The left ventricle has no regional wall motion abnormalities. The left ventricular internal cavity size was normal in size. There is  no left ventricular hypertrophy. Right Ventricle: The right ventricular size is  severely enlarged. Right ventricular systolic function is moderately reduced. Left Atrium: Left atrial size was normal in size. Right Atrium: Right atrial size was severely dilated. Pericardium: There is no evidence of pericardial effusion. Mitral Valve: The mitral valve is normal in structure. Tricuspid Valve: Tricuspid valve regurgitation is severe. Aortic Valve: The aortic valve was not assessed. Pulmonic Valve: The pulmonic valve was not assessed. Venous: The inferior vena cava is dilated in size with less than 50% respiratory variability, suggesting right atrial pressure of 15 mmHg. IAS/Shunts: There is left bowing of the interatrial septum, suggestive of elevated right atrial pressure. Additional Comments: Limited study; not all views obtained; normal LV function; severe TR (leaflets do not coapt); severe RAE and RVE; moderate RV dysfunction.  LEFT VENTRICLE PLAX 2D LVIDd:         4.90 cm Diastology LVIDs:  2.80 cm LV e' lateral:   11.60 cm/s LV PW:         1.20 cm LV E/e' lateral: 8.6 LV IVS:        0.90 cm LV e' medial:    8.27 cm/s                        LV E/e' medial:  12.1  MITRAL VALVE MV Area (PHT): 3.85 cm MV Decel Time: 197 msec MV E velocity: 100.00 cm/s MV A velocity: 78.00 cm/s MV E/A ratio:  1.28 Kirk Ruths MD Electronically signed by Kirk Ruths MD Signature Date/Time: 05/07/2019/3:21:26 PM    Final      Medications:   . sodium chloride    . sodium chloride 10 mL/hr at May 22, 2019 0800  . ceFEPime (MAXIPIME) IV Stopped (05/22/19 0127)  . fentaNYL infusion INTRAVENOUS 75 mcg/hr (2019-05-22 0800)  . heparin 10,000 units/ 20 mL infusion syringe 1,350 Units/hr (22-May-2019 0303)  . norepinephrine (LEVOPHED) Adult infusion 50 mcg/min (May 22, 2019 0800)  . phytonadione (VITAMIN K) IV Stopped (05/07/19 1523)  . prismasol BGK 4/2.5 2,000 mL/hr at 05/22/2019 0443  .  sodium bicarbonate  infusion 1000 mL 150 mL/hr at 2019/05/22 0800  . sodium bicarbonate (isotonic) 1000 mL infusion 150 mL/hr  at 05-22-19 0443  . sodium bicarbonate (isotonic) 1000 mL infusion 150 mL/hr at 22-May-2019 0555  . vancomycin    . vasopressin (PITRESSIN) infusion - *FOR SHOCK* 0.03 Units/min (2019/05/22 0800)   . chlorhexidine gluconate (MEDLINE KIT)  15 mL Mouth Rinse BID  . Chlorhexidine Gluconate Cloth  6 each Topical Daily  . fentaNYL (SUBLIMAZE) injection  100 mcg Intravenous Once  . gabapentin  100 mg Per Tube Q8H  . lactulose  10 g Per Tube BID  . mouth rinse  15 mL Mouth Rinse 10 times per day  . pantoprazole (PROTONIX) IV  40 mg Intravenous Q24H   Place/Maintain arterial line **AND** sodium chloride, bisacodyl, dextrose, docusate, fentaNYL, heparin, heparin, midazolam, midazolam, sodium chloride  Assessment/ Plan:   Acute kidney injury with shock hypotension and profound metabolic acidosis.  Is anuric.  No evidence of hydronephrosis or obstruction on CT scan of abdomen and pelvis.  Unable to obtain urine sediment.  Appears that patient was taking nonsteroidal anti-inflammatory drugs prior to admission.  There also appears to be severe liver injury on the background of alcoholic steatohepatitis.  Patient has been abstinent for  greater than 6 months.  Hepatorenal syndrome would also fit with this scenario.  Patient does demonstrate ascites on CT scan.  Would avoid contrast and any nephrotoxins.  Would avoid vancomycin nonsteroidal anti-inflammatories Cox 2 inhibitors ACE inhibitors ARB's.  We will check a daily renal panel.    Minimal urine output.  Microscopic urinalysis does not reveal any evidence of activity.  CRRT initiated 05/07/2019  Anion gap metabolic acidosis with severe elevated lactate level in the setting of severe liver injury.  Will institute CRRT with bicarbonate replacement fluids.  Continue bicarbonate replacement fluids.  IV bicarbonate.  Continued lactic acidosis with   poor prognosis.  Respiratory alkalosis.  Compensation for severe respiratory acidosis.  Patient now  intubated.  History of EtOH abuse.  Last drink appears to be in October 2020.  Starting thiamine and folic supplementation.  History of hepatic adenoma and hepatic alcoholic steatohepatitis.  Awaiting GI consultation.  Patient appears to have a poor prognosis with continued metabolic acidosis despite large amounts of IV bicarbonate being administered.  We will  continue CRRT until otherwise advised by critical care medicine.   LOS: 2 Sherril Croon @TODAY @8 :08 AM

## 2019-06-06 DEATH — deceased
